# Patient Record
Sex: Female | Born: 1960 | Race: White | Hispanic: No | Marital: Married | Smoking: Never smoker
Health system: Southern US, Community
[De-identification: ages and names within clinical notes are randomized; demographics above are authoritative.]

## PROBLEM LIST (undated history)

## (undated) DIAGNOSIS — J309 Allergic rhinitis, unspecified: Secondary | ICD-10-CM

## (undated) DIAGNOSIS — E039 Hypothyroidism, unspecified: Secondary | ICD-10-CM

## (undated) DIAGNOSIS — J45909 Unspecified asthma, uncomplicated: Secondary | ICD-10-CM

## (undated) DIAGNOSIS — K8020 Calculus of gallbladder without cholecystitis without obstruction: Secondary | ICD-10-CM

## (undated) DIAGNOSIS — E041 Nontoxic single thyroid nodule: Secondary | ICD-10-CM

## (undated) HISTORY — DX: Calculus of gallbladder without cholecystitis without obstruction: K80.20

## (undated) HISTORY — DX: Unspecified asthma, uncomplicated: J45.909

## (undated) HISTORY — DX: Nontoxic single thyroid nodule: E04.1

## (undated) HISTORY — DX: Hypothyroidism, unspecified: E03.9

## (undated) HISTORY — PX: LASIK: SHX215

## (undated) HISTORY — PX: VEIN LIGATION AND STRIPPING: SHX2653

## (undated) HISTORY — PX: AUGMENTATION MAMMAPLASTY: SUR837

## (undated) HISTORY — DX: Allergic rhinitis, unspecified: J30.9

---

## 1999-05-31 HISTORY — PX: CHOLECYSTECTOMY: SHX55

## 1999-07-03 ENCOUNTER — Encounter (HOSPITAL_BASED_OUTPATIENT_CLINIC_OR_DEPARTMENT_OTHER): Payer: Self-pay | Admitting: General Surgery

## 1999-07-08 ENCOUNTER — Encounter (HOSPITAL_BASED_OUTPATIENT_CLINIC_OR_DEPARTMENT_OTHER): Payer: Self-pay | Admitting: General Surgery

## 1999-07-08 ENCOUNTER — Ambulatory Visit (HOSPITAL_COMMUNITY): Admission: RE | Admit: 1999-07-08 | Discharge: 1999-07-09 | Payer: Self-pay | Admitting: General Surgery

## 1999-08-31 HISTORY — PX: BREAST ENHANCEMENT SURGERY: SHX7

## 2000-02-04 ENCOUNTER — Other Ambulatory Visit: Admission: RE | Admit: 2000-02-04 | Discharge: 2000-02-04 | Payer: Self-pay | Admitting: Family Medicine

## 2001-02-27 ENCOUNTER — Other Ambulatory Visit: Admission: RE | Admit: 2001-02-27 | Discharge: 2001-02-27 | Payer: Self-pay | Admitting: Family Medicine

## 2002-05-01 ENCOUNTER — Other Ambulatory Visit: Admission: RE | Admit: 2002-05-01 | Discharge: 2002-05-01 | Payer: Self-pay | Admitting: Family Medicine

## 2004-04-30 ENCOUNTER — Other Ambulatory Visit: Admission: RE | Admit: 2004-04-30 | Discharge: 2004-04-30 | Payer: Self-pay | Admitting: Family Medicine

## 2004-05-29 ENCOUNTER — Encounter: Admission: RE | Admit: 2004-05-29 | Discharge: 2004-05-29 | Payer: Self-pay | Admitting: Family Medicine

## 2005-05-18 ENCOUNTER — Ambulatory Visit: Payer: Self-pay | Admitting: Family Medicine

## 2005-06-16 ENCOUNTER — Ambulatory Visit: Payer: Self-pay | Admitting: Family Medicine

## 2005-07-19 ENCOUNTER — Ambulatory Visit: Payer: Self-pay | Admitting: Family Medicine

## 2005-07-19 ENCOUNTER — Encounter: Payer: Self-pay | Admitting: Family Medicine

## 2005-07-19 ENCOUNTER — Other Ambulatory Visit: Admission: RE | Admit: 2005-07-19 | Discharge: 2005-07-19 | Payer: Self-pay | Admitting: Family Medicine

## 2005-08-17 ENCOUNTER — Ambulatory Visit: Payer: Self-pay | Admitting: Family Medicine

## 2006-09-30 ENCOUNTER — Ambulatory Visit: Payer: Self-pay | Admitting: Family Medicine

## 2006-10-04 ENCOUNTER — Ambulatory Visit: Payer: Self-pay | Admitting: Family Medicine

## 2006-10-04 ENCOUNTER — Other Ambulatory Visit: Admission: RE | Admit: 2006-10-04 | Discharge: 2006-10-04 | Payer: Self-pay | Admitting: Family Medicine

## 2006-10-04 ENCOUNTER — Encounter: Payer: Self-pay | Admitting: Family Medicine

## 2007-02-07 ENCOUNTER — Telehealth (INDEPENDENT_AMBULATORY_CARE_PROVIDER_SITE_OTHER): Payer: Self-pay | Admitting: *Deleted

## 2007-02-08 DIAGNOSIS — E041 Nontoxic single thyroid nodule: Secondary | ICD-10-CM | POA: Insufficient documentation

## 2007-09-06 ENCOUNTER — Telehealth (INDEPENDENT_AMBULATORY_CARE_PROVIDER_SITE_OTHER): Payer: Self-pay | Admitting: *Deleted

## 2007-10-23 ENCOUNTER — Encounter: Payer: Self-pay | Admitting: Family Medicine

## 2007-10-23 ENCOUNTER — Ambulatory Visit: Payer: Self-pay | Admitting: Family Medicine

## 2007-10-23 ENCOUNTER — Other Ambulatory Visit: Admission: RE | Admit: 2007-10-23 | Discharge: 2007-10-23 | Payer: Self-pay | Admitting: Family Medicine

## 2007-10-23 ENCOUNTER — Encounter: Admission: RE | Admit: 2007-10-23 | Discharge: 2007-10-23 | Payer: Self-pay | Admitting: Family Medicine

## 2007-10-23 DIAGNOSIS — E039 Hypothyroidism, unspecified: Secondary | ICD-10-CM | POA: Insufficient documentation

## 2007-10-24 ENCOUNTER — Encounter: Payer: Self-pay | Admitting: Family Medicine

## 2007-10-25 ENCOUNTER — Encounter (INDEPENDENT_AMBULATORY_CARE_PROVIDER_SITE_OTHER): Payer: Self-pay | Admitting: *Deleted

## 2007-10-31 ENCOUNTER — Telehealth (INDEPENDENT_AMBULATORY_CARE_PROVIDER_SITE_OTHER): Payer: Self-pay | Admitting: *Deleted

## 2008-09-05 ENCOUNTER — Ambulatory Visit: Payer: Self-pay | Admitting: Family Medicine

## 2008-09-05 DIAGNOSIS — G4700 Insomnia, unspecified: Secondary | ICD-10-CM | POA: Insufficient documentation

## 2008-09-20 ENCOUNTER — Ambulatory Visit: Payer: Self-pay | Admitting: Family Medicine

## 2008-10-14 ENCOUNTER — Ambulatory Visit: Payer: Self-pay | Admitting: Family Medicine

## 2008-10-14 ENCOUNTER — Encounter: Payer: Self-pay | Admitting: Family Medicine

## 2008-10-14 ENCOUNTER — Other Ambulatory Visit: Admission: RE | Admit: 2008-10-14 | Discharge: 2008-10-14 | Payer: Self-pay | Admitting: Family Medicine

## 2008-10-14 LAB — CONVERTED CEMR LAB
Nitrite: NEGATIVE
Urobilinogen, UA: NEGATIVE

## 2008-10-15 ENCOUNTER — Encounter: Payer: Self-pay | Admitting: Family Medicine

## 2008-10-16 ENCOUNTER — Telehealth: Payer: Self-pay | Admitting: Family Medicine

## 2008-10-23 ENCOUNTER — Ambulatory Visit: Payer: Self-pay | Admitting: Family Medicine

## 2008-10-24 ENCOUNTER — Encounter (INDEPENDENT_AMBULATORY_CARE_PROVIDER_SITE_OTHER): Payer: Self-pay | Admitting: *Deleted

## 2008-10-28 ENCOUNTER — Encounter (INDEPENDENT_AMBULATORY_CARE_PROVIDER_SITE_OTHER): Payer: Self-pay | Admitting: *Deleted

## 2008-10-28 ENCOUNTER — Telehealth (INDEPENDENT_AMBULATORY_CARE_PROVIDER_SITE_OTHER): Payer: Self-pay | Admitting: *Deleted

## 2008-10-28 LAB — CONVERTED CEMR LAB
ALT: 20 units/L (ref 0–35)
Alkaline Phosphatase: 32 units/L — ABNORMAL LOW (ref 39–117)
Bilirubin, Direct: 0.2 mg/dL (ref 0.0–0.3)
CO2: 25 meq/L (ref 19–32)
Chloride: 100 meq/L (ref 96–112)
Glucose, Bld: 75 mg/dL (ref 70–99)
HDL: 61 mg/dL (ref >39.0)
Hemoglobin: 13.5 g/dL (ref 12.0–15.0)
LDL Cholesterol: 126 mg/dL — ABNORMAL HIGH (ref 0–99)
Lymphocytes Relative: 41.3 % (ref 12.0–46.0)
Monocytes Relative: 10 % (ref 3.0–12.0)
Neutrophils Relative %: 47.4 % (ref 43.0–77.0)
Platelets: 248 10*3/uL (ref 150–400)
Potassium: 3.3 meq/L — ABNORMAL LOW (ref 3.5–5.1)
RDW: 12.4 % (ref 11.5–14.6)
Sodium: 137 meq/L (ref 135–145)
Total Bilirubin: 1.3 mg/dL — ABNORMAL HIGH (ref 0.3–1.2)
Total CHOL/HDL Ratio: 3.3
Total Protein: 7.1 g/dL (ref 6.0–8.3)
Triglycerides: 62 mg/dL (ref 0–149)
VLDL: 12 mg/dL (ref 0–40)

## 2009-01-08 ENCOUNTER — Telehealth (INDEPENDENT_AMBULATORY_CARE_PROVIDER_SITE_OTHER): Payer: Self-pay | Admitting: *Deleted

## 2009-02-21 ENCOUNTER — Ambulatory Visit: Payer: Self-pay | Admitting: Family Medicine

## 2009-02-27 ENCOUNTER — Telehealth (INDEPENDENT_AMBULATORY_CARE_PROVIDER_SITE_OTHER): Payer: Self-pay | Admitting: *Deleted

## 2009-04-07 ENCOUNTER — Telehealth (INDEPENDENT_AMBULATORY_CARE_PROVIDER_SITE_OTHER): Payer: Self-pay | Admitting: *Deleted

## 2009-04-08 LAB — CONVERTED CEMR LAB
ALT: 14 units/L (ref 0–35)
Alkaline Phosphatase: 37 units/L — ABNORMAL LOW (ref 39–117)
Bilirubin, Direct: 0.1 mg/dL (ref 0.0–0.3)
HDL: 53.8 mg/dL (ref >39.00)
LDL Cholesterol: 95 mg/dL (ref 0–99)
Total Bilirubin: 1.1 mg/dL (ref 0.3–1.2)
Total CHOL/HDL Ratio: 3
Total Protein: 7.1 g/dL (ref 6.0–8.3)

## 2009-04-09 ENCOUNTER — Encounter (INDEPENDENT_AMBULATORY_CARE_PROVIDER_SITE_OTHER): Payer: Self-pay | Admitting: *Deleted

## 2009-08-30 HISTORY — PX: DILATION AND CURETTAGE OF UTERUS: SHX78

## 2009-08-30 HISTORY — PX: OTHER SURGICAL HISTORY: SHX169

## 2009-10-16 ENCOUNTER — Encounter (INDEPENDENT_AMBULATORY_CARE_PROVIDER_SITE_OTHER): Payer: Self-pay | Admitting: *Deleted

## 2009-10-16 ENCOUNTER — Ambulatory Visit: Payer: Self-pay | Admitting: Family Medicine

## 2009-10-16 ENCOUNTER — Other Ambulatory Visit: Admission: RE | Admit: 2009-10-16 | Discharge: 2009-10-16 | Payer: Self-pay | Admitting: Family Medicine

## 2009-10-16 DIAGNOSIS — I8310 Varicose veins of unspecified lower extremity with inflammation: Secondary | ICD-10-CM | POA: Insufficient documentation

## 2009-10-16 DIAGNOSIS — N841 Polyp of cervix uteri: Secondary | ICD-10-CM | POA: Insufficient documentation

## 2009-10-20 LAB — CONVERTED CEMR LAB
AST: 24 units/L (ref 0–37)
Albumin: 4.6 g/dL (ref 3.5–5.2)
BUN: 15 mg/dL (ref 6–23)
Basophils Absolute: 0 10*3/uL (ref 0.0–0.1)
CO2: 28 meq/L (ref 19–32)
Cholesterol: 177 mg/dL (ref 0–200)
Eosinophils Absolute: 0 10*3/uL (ref 0.0–0.7)
Free T4: 1.3 ng/dL (ref 0.6–1.6)
GFR calc non Af Amer: 56.74 mL/min (ref >60)
Glucose, Bld: 83 mg/dL (ref 70–99)
HCT: 42 % (ref 36.0–46.0)
HDL: 72.7 mg/dL (ref >39.00)
Hemoglobin: 13.9 g/dL (ref 12.0–15.0)
Lymphs Abs: 2.3 10*3/uL (ref 0.7–4.0)
MCHC: 33.1 g/dL (ref 30.0–36.0)
Monocytes Absolute: 0.7 10*3/uL (ref 0.1–1.0)
Monocytes Relative: 9.6 % (ref 3.0–12.0)
Neutro Abs: 3.8 10*3/uL (ref 1.4–7.7)
Platelets: 220 10*3/uL (ref 150.0–400.0)
Potassium: 5.2 meq/L — ABNORMAL HIGH (ref 3.5–5.1)
RDW: 12.7 % (ref 11.5–14.6)
Sodium: 141 meq/L (ref 135–145)
T3, Free: 2.7 pg/mL (ref 2.3–4.2)
TSH: 0.62 microintl units/mL (ref 0.35–5.50)
Total Bilirubin: 0.7 mg/dL (ref 0.3–1.2)
VLDL: 17.6 mg/dL (ref 0.0–40.0)

## 2009-10-21 ENCOUNTER — Encounter (INDEPENDENT_AMBULATORY_CARE_PROVIDER_SITE_OTHER): Payer: Self-pay | Admitting: *Deleted

## 2009-10-21 LAB — CONVERTED CEMR LAB: Pap Smear: NEGATIVE

## 2009-10-22 ENCOUNTER — Telehealth: Payer: Self-pay | Admitting: Family Medicine

## 2009-10-24 ENCOUNTER — Ambulatory Visit: Payer: Self-pay | Admitting: Family Medicine

## 2009-10-25 LAB — CONVERTED CEMR LAB: Fecal Occult Bld: NEGATIVE

## 2009-11-14 ENCOUNTER — Encounter (INDEPENDENT_AMBULATORY_CARE_PROVIDER_SITE_OTHER): Payer: Self-pay | Admitting: *Deleted

## 2009-12-19 ENCOUNTER — Ambulatory Visit: Payer: Self-pay | Admitting: Diagnostic Radiology

## 2009-12-19 ENCOUNTER — Ambulatory Visit (HOSPITAL_BASED_OUTPATIENT_CLINIC_OR_DEPARTMENT_OTHER): Admission: RE | Admit: 2009-12-19 | Discharge: 2009-12-19 | Payer: Self-pay | Admitting: Family Medicine

## 2010-01-19 ENCOUNTER — Ambulatory Visit (HOSPITAL_BASED_OUTPATIENT_CLINIC_OR_DEPARTMENT_OTHER): Admission: RE | Admit: 2010-01-19 | Discharge: 2010-01-19 | Payer: Self-pay | Admitting: Family Medicine

## 2010-01-19 ENCOUNTER — Ambulatory Visit: Payer: Self-pay | Admitting: Diagnostic Radiology

## 2010-01-20 DIAGNOSIS — N926 Irregular menstruation, unspecified: Secondary | ICD-10-CM | POA: Insufficient documentation

## 2010-01-29 ENCOUNTER — Encounter: Payer: Self-pay | Admitting: Family Medicine

## 2010-02-04 ENCOUNTER — Encounter: Payer: Self-pay | Admitting: Family Medicine

## 2010-04-07 ENCOUNTER — Encounter: Payer: Self-pay | Admitting: Family Medicine

## 2010-04-07 ENCOUNTER — Telehealth: Payer: Self-pay | Admitting: Family Medicine

## 2010-05-05 ENCOUNTER — Ambulatory Visit: Payer: Self-pay | Admitting: Vascular Surgery

## 2010-05-05 ENCOUNTER — Encounter: Payer: Self-pay | Admitting: Family Medicine

## 2010-07-06 ENCOUNTER — Encounter: Payer: Self-pay | Admitting: Family Medicine

## 2010-07-07 ENCOUNTER — Encounter: Payer: Self-pay | Admitting: Family Medicine

## 2010-07-07 ENCOUNTER — Telehealth: Payer: Self-pay | Admitting: Family Medicine

## 2010-07-13 ENCOUNTER — Ambulatory Visit: Payer: Self-pay | Admitting: Family Medicine

## 2010-07-13 ENCOUNTER — Ambulatory Visit: Payer: Self-pay | Admitting: Vascular Surgery

## 2010-07-17 LAB — CONVERTED CEMR LAB
Albumin: 4.2 g/dL (ref 3.5–5.2)
Alkaline Phosphatase: 33 units/L — ABNORMAL LOW (ref 39–117)
CO2: 25 meq/L (ref 19–32)
Chloride: 103 meq/L (ref 96–112)
Cholesterol: 179 mg/dL (ref 0–200)
Creatinine, Ser: 1 mg/dL (ref 0.4–1.2)
HDL: 64.4 mg/dL (ref >39.00)
LDL Cholesterol: 97 mg/dL (ref 0–99)
Sodium: 136 meq/L (ref 135–145)
Total Protein: 7.2 g/dL (ref 6.0–8.3)
Triglycerides: 88 mg/dL (ref 0.0–149.0)

## 2010-08-06 ENCOUNTER — Telehealth (INDEPENDENT_AMBULATORY_CARE_PROVIDER_SITE_OTHER): Payer: Self-pay | Admitting: *Deleted

## 2010-08-17 ENCOUNTER — Ambulatory Visit: Payer: Self-pay | Admitting: Vascular Surgery

## 2010-09-27 LAB — CONVERTED CEMR LAB
ALT: 16 units/L (ref 0–35)
AST: 22 units/L (ref 0–37)
AST: 29 units/L (ref 0–37)
Albumin: 4.2 g/dL (ref 3.5–5.2)
Alkaline Phosphatase: 38 units/L — ABNORMAL LOW (ref 39–117)
BUN: 13 mg/dL (ref 6–23)
Basophils Absolute: 0.1 10*3/uL (ref 0.0–0.1)
Basophils Relative: 0.5 % (ref 0.0–1.0)
Basophils Relative: 1.1 % — ABNORMAL HIGH (ref 0.0–1.0)
Bilirubin, Direct: 0.1 mg/dL (ref 0.0–0.3)
CO2: 28 meq/L (ref 19–32)
Calcium: 9.1 mg/dL (ref 8.4–10.5)
Chloride: 104 meq/L (ref 96–112)
Chloride: 104 meq/L (ref 96–112)
Creatinine, Ser: 1.1 mg/dL (ref 0.4–1.2)
Eosinophils Relative: 1.4 % (ref 0.0–5.0)
GFR calc non Af Amer: 52 mL/min
Glucose, Bld: 88 mg/dL (ref 70–99)
HCT: 40.9 % (ref 36.0–46.0)
MCHC: 35.5 g/dL (ref 30.0–36.0)
Neutrophils Relative %: 58.2 % (ref 43.0–77.0)
Platelets: 277 10*3/uL (ref 150–400)
RBC: 4.19 M/uL (ref 3.87–5.11)
RBC: 4.22 M/uL (ref 3.87–5.11)
RDW: 12.1 % (ref 11.5–14.6)
RDW: 12.4 % (ref 11.5–14.6)
Sodium: 138 meq/L (ref 135–145)
Total Bilirubin: 0.8 mg/dL (ref 0.3–1.2)
Total CHOL/HDL Ratio: 3.5
Total CHOL/HDL Ratio: 3.8
Total Protein: 7.5 g/dL (ref 6.0–8.3)
Triglycerides: 108 mg/dL (ref 0–149)
Triglycerides: 76 mg/dL (ref 0–149)
VLDL: 15 mg/dL (ref 0–40)
VLDL: 22 mg/dL (ref 0–40)
WBC: 8 10*3/uL (ref 4.5–10.5)

## 2010-09-29 ENCOUNTER — Ambulatory Visit
Admission: RE | Admit: 2010-09-29 | Discharge: 2010-09-29 | Payer: Self-pay | Source: Home / Self Care | Attending: Vascular Surgery | Admitting: Vascular Surgery

## 2010-09-29 NOTE — Assessment & Plan Note (Signed)
Summary: CPX//PH   Vital Signs:  Patient profile:   50 year old female Height:      66.50 inches Weight:      144 pounds BMI:     22.98 Temp:     98.3 degrees F oral Pulse rate:   80 / minute Pulse rhythm:   regular BP sitting:   122 / 80  (left arm) Cuff size:   regular  Vitals Entered By: Army Fossa CMA (October 16, 2009 8:29 AM) CC: CPX, pap.    History of Present Illness: Pt here for cpe, pap and  labs.  Pt still having hot flashes and periods are irregular on bcp.   Hot flashes are waking her up. It is definitely getting worse.    Preventive Screening-Counseling & Management  Alcohol-Tobacco     Alcohol drinks/day: <1     Alcohol type: wine, beer     Smoking Status: never     Passive Smoke Exposure: no  Caffeine-Diet-Exercise     Caffeine use/day: 2     Does Patient Exercise: yes     Type of exercise: walk 2 miles a day, 10 min stom and butt and thighs     Exercise (avg: min/session): 30-60     Times/week: 5  Hep-HIV-STD-Contraception     HIV Risk: no     Dental Visit-last 6 months yes     Dental Care Counseling: not indicated; dental care within six months     SBE monthly: yes  Safety-Violence-Falls     Seat Belt Use: 100      Sexual History:  currently monogamous.    Current Medications (verified): 1)  Necon 1/35 (28) 1-35 Mg-Mcg Tabs (Norethindrone-Eth Estradiol) .... Take One Tablet Daily 2)  Levothyroxine Sodium 50 Mcg Tabs (Levothyroxine Sodium) .... Take One Tablet Daily 3)  Ambien Cr 12.5 Mg Cr-Tabs (Zolpidem Tartrate) .Marland Kitchen.. 1 By Mouth At Bedtime As Needed 4)  Pravachol 40 Mg Tabs (Pravastatin Sodium) .... Take 1 By Mouth  At Bedtime. Needs Labwork. 5)  Calcarb 600 1500 Mg Tabs (Calcium Carbonate) 6)  Vitamin D 1000 Unit Tabs (Cholecalciferol)  Allergies (verified): No Known Drug Allergies  Past History:  Past Medical History: Last updated: 10/23/2007 Gallstones Hypothyroidism thyroid nodule  Past Surgical History: Last updated:  02/08/2007 C/S x 2 Lasix Cholecystectomy-05/1999 Breast Augmentation-2001  Family History: Last updated: 10/23/2007 Family History Hypertension Family History of Neurological disorder-neuropathy Fam hx CVA PUncle--Lymphoma PGF--Cancer--?  Social History: Last updated: 10/23/2007 Married Alcohol use-yes Regular exercise-yes Occupation: Worker's Comp-- Underwriting assist.                    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 Family History Hypertension Family History of Neurological disorder-neuropathy Fam hx CVA PUncle--Lymphoma PGF--Cancer--?  Social History: Reviewed history from 10/23/2007 and no changes required. Married Alcohol use-yes Regular exercise-yes Occupation: Worker's Comp-- Biomedical scientist.                    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 Eyes:  Denies blurring, discharge, double vision, eye irritation, eye pain, halos, itching, light sensitivity, red eye, vision loss-1 eye, and vision loss-both eyes; opthoq1y. ENT:  Denies decreased hearing, difficulty swallowing, ear discharge, earache, hoarseness, nasal congestion, nosebleeds, postnasal drainage, ringing in ears, sinus pressure, and sore throat. CV:  Denies bluish discoloration of lips or nails, chest pain or discomfort, difficulty breathing at night, difficulty breathing while lying down, fainting, fatigue, leg cramps with exertion, lightheadness,  near fainting, palpitations, shortness of breath with exertion, swelling of feet, swelling of hands, and weight gain. Resp:  Denies chest discomfort, chest pain with inspiration, cough, coughing up blood, excessive snoring, hypersomnolence, morning headaches, pleuritic, shortness of breath, sputum productive, and wheezing. GI:  Denies abdominal pain, bloody stools, change in bowel habits, constipation, dark tarry stools, diarrhea, excessive appetite, gas, hemorrhoids, indigestion, loss of appetite, nausea, vomiting, vomiting blood, and yellowish skin color. GU:  Denies abnormal vaginal bleeding, decreased libido, discharge, dysuria, genital sores, hematuria, incontinence, nocturia, urinary frequency, and urinary hesitancy. MS:  Denies joint pain, joint redness, joint swelling, loss of strength, low back pain, mid back pain, muscle aches, muscle , cramps, muscle weakness, stiffness, and thoracic pain; L LE varicose vein-- causing pain. Derm:  Denies changes in color of skin, changes in nail beds, dryness, excessive perspiration, flushing, hair loss, insect bite(s), itching, lesion(s), poor wound healing, and rash; varicose veins. Neuro:  Denies brief paralysis, difficulty with concentration, disturbances in coordination, falling down, headaches, inability to speak, memory loss, numbness, poor balance, seizures, sensation of room spinning, tingling, tremors, visual disturbances, and weakness. Psych:  Denies alternate hallucination ( auditory/visual), anxiety, depression, easily angered, easily tearful, irritability, mental problems, panic attacks, sense of great danger, suicidal thoughts/plans, thoughts of violence, unusual visions or sounds, and thoughts /plans of harming others. Endo:  Denies cold intolerance, excessive hunger, excessive thirst, excessive urination, heat intolerance, polyuria, and weight change. Heme:  Denies abnormal bruising, bleeding, enlarge lymph nodes, fevers, pallor, and skin  discoloration. Allergy:  Denies hives or rash, itching eyes, persistent infections, seasonal allergies, and sneezing.  Physical Exam  General:  Well-developed,well-nourished,in no acute distress; alert,appropriate and cooperative throughout examination Head:  Normocephalic and atraumatic without obvious abnormalities. No apparent alopecia or balding. Eyes:  vision grossly intact, pupils equal, pupils round, and pupils reactive to light.   Ears:  External ear exam shows no significant lesions or deformities.  Otoscopic examination reveals clear canals, tympanic membranes are intact bilaterally without bulging, retraction, inflammation or discharge. Hearing is grossly normal bilaterally. Nose:  External nasal examination shows no deformity or inflammation. Nasal mucosa are pink and moist without lesions or exudates. Mouth:  Oral mucosa and oropharynx without lesions or exudates.  Teeth in good repair. Neck:  No deformities, masses, or tenderness noted.no carotid bruits.   Chest Wall:  No deformities, masses, or tenderness noted. Breasts:  No mass, nodules, thickening, tenderness, bulging, retraction, inflamation, nipple discharge or skin changes noted.   + implants Lungs:  Normal respiratory effort, chest expands symmetrically. Lungs are clear to auscultation, no crackles or wheezes. Heart:  Normal rate and regular rhythm. S1 and S2 normal without gallop, murmur, click, rub or other extra sounds. Abdomen:  Bowel sounds positive,abdomen soft and non-tender without masses, organomegaly or hernias noted. Rectal:  No external abnormalities noted. Normal sphincter tone. No rectal masses or tenderness. Genitalia:  Pelvic Exam:        External: normal female genitalia without lesions or masses  Vagina: normal without lesions or masses        Cervix: normal without lesions or masses        Adnexa: normal bimanual exam without masses or fullness        Uterus: normal by palpation        Pap smear:  performed Extremities:  L Low ext-- varicosity below pop. fossa no edema  no calf pain Neurologic:  No cranial nerve deficits noted. Station and gait are normal. Plantar reflexes are down-going bilaterally. DTRs are symmetrical throughout. Sensory, motor and coordinative functions appear intact.   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 Take one tablet daily  Labs Reviewed: TSH: 0.65 (10/14/2008)    Chol: 167 (02/21/2009)   HDL: 53.80 (02/21/2009)   LDL: 95 (02/21/2009)   TG: 93.0 (02/21/2009)  Orders: Venipuncture (86578) TLB-Lipid Panel (80061-LIPID) TLB-BMP (Basic Metabolic Panel-BMET) (80048-METABOL) TLB-CBC Platelet - w/Differential (85025-CBCD) TLB-Hepatic/Liver Function Pnl (80076-HEPATIC) TLB-TSH (Thyroid Stimulating Hormone) (84443-TSH) TLB-T3, Free (Triiodothyronine) (84481-T3FREE) TLB-T4 (Thyrox), Free [phone])  Complete  Medication List: 1)  Necon 1/35 (28) 1-35 Mg-mcg Tabs (Norethindrone-eth estradiol) .... Take one tablet daily 2)  Levothyroxine Sodium 50 Mcg Tabs (Levothyroxine sodium) .... Take one tablet daily 3)  Ambien Cr 12.5 Mg Cr-tabs (Zolpidem tartrate) .Marland Kitchen.. 1 by mouth at bedtime as needed 4)  Pravachol 40 Mg Tabs (Pravastatin sodium) .... Take 1 by mouth  at bedtime. needs labwork. 5)  Calcarb 600 1500 Mg Tabs (Calcium carbonate) 6)  Vitamin D 1000 Unit Tabs (Cholecalciferol) Prescriptions: NECON 1/35 (28) 1-35 MG-MCG TABS (NORETHINDRONE-ETH ESTRADIOL) take one tablet daily  #1 x 11   Entered and Authorized by:   Loreen Freud DO   Signed by:   Loreen Freud DO on 10/16/2009   Method used:   Electronically to        [facility]* (retail)       [address] [place], [place]  [address]       Ph: [phone]       Fax: [phone]   RxID:   [id number]    EKG  Procedure date:  10/16/2009  Findings:      NSR  76 bpm      Flu Vaccine Next Due:  Refused

## 2010-09-29 NOTE — Letter (Signed)
Summary: [facility]   Imported By: Lanelle Bal 02/17/2010 10:26:03  _____________________________________________________________________  External Attachment:    Type:   Image     Comment:   External Document

## 2010-09-29 NOTE — Letter (Signed)
Summary: Primary Care Appointment Letter  [facility]  [address] [place], [place] [address]   Phone: [phone]  Fax: [phone]    07/06/2010 MRN: [id number]  [facility] [address] [place], [place]  [address]  Dear Ms. Boerema,   Your Primary Care Physician Loreen Freud DO has indicated that:    _______it is time to schedule an appointment.    _______you missed your appointment on______ and need to call and          reschedule.    ___X__you need to have lab work done.    _______you need to schedule an appointment discuss lab or test results.    _______you need to call to reschedule your appointment that is                       scheduled on _________.     Please call our office as soon as possible. Our phone number is [phone]. Please press option 1. Our office is open 8a-5p, Monday through Friday.     Thank you,          [facility] Scheduler

## 2010-09-29 NOTE — Consult Note (Signed)
Summary: [facility]  [facility]   Imported By: Sherian Rein 05/22/2010 15:09:11  _____________________________________________________________________  External Attachment:    Type:   Image     Comment:   External Document

## 2010-09-29 NOTE — Progress Notes (Signed)
Summary: note   Phone Note Call from Patient Call back at [phone]   Summary of Call: Pt states that she needs a note stating that she was rx compression stocking. Pt needs this to take to her [facility] appt on 07-13-10. See phone note 04-07-10 pls advise............Marland KitchenFelecia Deloach CMA  July 07, 2010 8:54 AM   Follow-up for Phone Call        note typed Follow-up by: Loreen Freud DO,  July 07, 2010 9:32 AM  Additional Follow-up for Phone Call Additional follow up Details #1::        Note at check in, and advise pt that labs are now due. Transferred to appt.... Almeta Monas CMA Duncan Dull)  July 07, 2010 10:24 AM

## 2010-09-29 NOTE — Letter (Signed)
Summary: Primary Care Consult Scheduled Letter  [facility]  [address] [place], [place] [address]   Phone: [phone]  Fax: [phone]      11/14/2009 MRN: [id number]  [facility] [address] [place], [place]  [address]    Dear Ms. Seiber,    We have scheduled an appointment for you.  At the recommendation of Dr. Loreen Freud, we have scheduled you for a Screening Mammogram with [facility] on 12-15-2009 at 3:30pm.  We have also scheduled you for a Pelvis Ultrasound at the same facility on 01-19-2010 at 3:20pm.  Their address is [address], [place]. The office phone number is [phone].  If this appointment day and time is not convenient for you, please feel free to call the office of the doctor you are being referred to at the number listed above and reschedule the appointment.    It is important for you to keep your scheduled appointments. We are here to make sure you are given good patient care.   Thank you,    Renee, Patient Care Coordinator [place] [facility]

## 2010-09-29 NOTE — Progress Notes (Signed)
Summary: levothyroxine, pravachol refills  Phone Note Refill Request Message from:  Fax from Pharmacy on August 06, 2010 4:15 PM  Refills Requested: Medication #1:  LEVOTHYROXINE SODIUM 50 MCG TABS take one tablet daily   Last Refilled: 07/06/2010   Notes: qty = 30  Medication #2:  PRAVACHOL 40 MG TABS take 1 by mouth  at bedtime. NEEDS LABWORK.   Last Refilled: 07/06/2010   Notes: qty = [address] [facility], [place], [place], [place]    phone-[phone]   fax-[phone]     Next Appointment Scheduled: none Initial call taken by: Jerolyn Shin,  August 06, 2010 4:23 PM    New/Updated Medications: PRAVACHOL 40 MG TABS (PRAVASTATIN SODIUM) take 1 by mouth  at bedtime. Prescriptions: PRAVACHOL 40 MG TABS (PRAVASTATIN SODIUM) take 1 by mouth  at bedtime.  #30 x 2   Entered by:   Almeta Monas CMA (AAMA)   Authorized by:   Loreen Freud DO   Signed by:   Almeta Monas CMA (AAMA) on 08/06/2010   Method used:   Electronically to        [facility]* (retail)       [address] [place], [place]  [address]       Ph: [phone]       Fax: [phone]   RxID:   [id number] LEVOTHYROXINE SODIUM 50 MCG TABS (LEVOTHYROXINE SODIUM) take one tablet daily  #30 Each x 2   Entered by:   Almeta Monas CMA (AAMA)   Authorized by:   Loreen Freud DO   Signed by:   Almeta Monas CMA (AAMA) on 08/06/2010   Method used:   Electronically to        [facility] (retail)       [address] [place], [place]  [address]       Ph: [phone]       Fax: [phone]   RxID:   [id number]

## 2010-09-29 NOTE — Letter (Signed)
Summary: Results Follow up Letter  [facility]  [address] [place], [place] [address]   Phone: [phone]  Fax: [phone]    10/21/2009 MRN: [id number]  Judy Ramos [address] [place], [place]  [address]  Dear Ms. Tapia,  The following are the results of your recent test(s):  Test         Result    Pap Smear:        Normal __X___  Not Normal _____ Comments: ______________________________________________________ Cholesterol: LDL(Bad cholesterol):         Your goal is less than:         HDL (Good cholesterol):       Your goal is more than: Comments:  ______________________________________________________ Mammogram:        Normal _____  Not Normal _____ Comments:  ___________________________________________________________________ Hemoccult:        Normal _____  Not normal _______ Comments:    _____________________________________________________________________ Other Tests:    We routinely do not discuss normal results over the telephone.  If you desire a copy of the results, or you have any questions about this information we can discuss them at your next office visit.   Sincerely,    Army Fossa CMA  October 21, 2009 4:56 PM

## 2010-09-29 NOTE — Letter (Signed)
Summary: Generic Letter  [facility]  [address] [place], [place] [address]   Phone: [phone]  Fax: [phone]    07/07/2010  RE: Judy Ramos Dob:01-12-1961 [address] [place], [place]  [address]  To whom It May Concern:  The above patient was prescribed compression stockings on 04/07/2010.           Sincerely,   Loreen Freud DO

## 2010-09-29 NOTE — Progress Notes (Signed)
Summary: Meds  Phone Note Outgoing Call   Summary of Call: Please call pt and find out if hotflashes occur all the time or only during placebo pills. Initial call taken by: Loreen Freud DO,  October 22, 2009 8:40 AM  Follow-up for Phone Call        Left message for pt to call back. Army Fossa CMA  October 22, 2009 8:47 AM   Additional Follow-up for Phone Call Additional follow up Details #1::        Pt states that her hot flashes occur all the time. Army Fossa CMA  October 22, 2009 3:10 PM     Additional Follow-up for Phone Call Additional follow up Details #2::    add vivelle dot 0.025 1 patch 2x a week # 8   5 refills  Follow-up by: Loreen Freud DO,  October 22, 2009 3:58 PM  Additional Follow-up for Phone Call Additional follow up Details #3:: Details for Additional Follow-up Action Taken: Pt is aware. Army Fossa CMA  October 22, 2009 4:04 PM   New/Updated Medications: VIVELLE-DOT 0.025 MG/24HR PTTW (ESTRADIOL) 1 patch 2x a week. Prescriptions: VIVELLE-DOT 0.025 MG/24HR PTTW (ESTRADIOL) 1 patch 2x a week.  #8 x 5   Entered by:   Army Fossa CMA   Authorized by:   Loreen Freud DO   Signed by:   Army Fossa CMA on 10/22/2009   Method used:   Electronically to        [facility]* (retail)       [address] [place], [place]  [address]       Ph: [phone]       Fax: [phone]   RxID:   [id number]

## 2010-09-29 NOTE — Consult Note (Signed)
Summary: [facility]   Imported By: Lanelle Bal 02/12/2010 08:11:51  _____________________________________________________________________  External Attachment:    Type:   Image     Comment:   External Document

## 2010-09-29 NOTE — Progress Notes (Signed)
Summary: Triage: Varicose Veins  Phone Note Call from Patient Call back at Home Phone [phone]   Caller: Patient Summary of Call: Message left on Triage VM: Patient had an annual sometime in Feb 2011 and mentioned her Varicose Veins, patient said she would now like to consider order for compression stockings and Dr.Lowne mentioned she could refer her to someone to futher address her Veins   Dr.Lowne please advise./[facility] CMA  April 07, 2010 4:39 PM   Follow-up for Phone Call        rx written referral put in Follow-up by: Loreen Freud DO,  April 07, 2010 4:49 PM    Additional Follow-up for Phone Call Additional follow up Details #2::    DISCUSS WITH PATIENT................Marland KitchenFelecia Deloach CMA  April 07, 2010 5:06 PM

## 2010-09-29 NOTE — Letter (Signed)
Summary: [facility]: Immunoassay Fecal Occult Blood (iFOB) Order Form  [facility]  [address] [place], [place] [address]   Phone: [phone]  Fax: [phone]      [facility]: Immunoassay Fecal Occult Blood (iFOB) Order Form   October 16, 2009 MRN: [id number]   Judy Ramos 1961/05/07   Physicican Name:______Yvonne Lowne___________________  Diagnosis Code:________v76.51__________________      Army Fossa CMA

## 2010-09-29 NOTE — Medication Information (Signed)
Summary: Compression Hose/[facility]  Compression Hose/[facility]   Imported By: Lanelle Bal 04/16/2010 10:42:07  _____________________________________________________________________  External Attachment:    Type:   Image     Comment:   External Document

## 2010-09-30 NOTE — Assessment & Plan Note (Signed)
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  The small saphenous vein was too small to cannulate and closed with laser ablation.  She states that the discomfort she was experiencing in the distal thigh and calf is now gone and she had no swelling in the left calf and ankle following the procedure.  The pain has been minimal.  PHYSICAL EXAMINATION:  Vital signs:  On exam, blood pressure 113/72, heart rate 71, respirations 18.  The stab phlebectomy sites have healed nicely with no tenderness or inflammation and no distal edema with 3+ dorsalis pedis pulse.  Reassured her regarding these findings.  She will return to see [place] on a p.r.n. basis.    Quita Skye Hart [place], M.D. Electronically Signed  JDL/MED[facility]  D:  09/29/2010  T:  09/30/2010  Job:  [id number]

## 2010-12-23 ENCOUNTER — Ambulatory Visit (INDEPENDENT_AMBULATORY_CARE_PROVIDER_SITE_OTHER): Payer: No Typology Code available for payment source | Admitting: Internal Medicine

## 2010-12-23 ENCOUNTER — Encounter: Payer: Self-pay | Admitting: Internal Medicine

## 2010-12-23 DIAGNOSIS — R21 Rash and other nonspecific skin eruption: Secondary | ICD-10-CM

## 2010-12-23 MED ORDER — HYDROCORTISONE 2.5 % EX CREA: TOPICAL_CREAM | Freq: Two times a day (BID) | CUTANEOUS | Status: DC

## 2010-12-23 NOTE — Assessment & Plan Note (Signed)
Rash is not consistent with shingles or a fungal infection. I suspect this is contact dermatitis poison ivy. See instructions and prescription. Patient will call if the rash spreads or if she has fever or joint aches

## 2010-12-23 NOTE — Progress Notes (Signed)
  Subjective:    Patient ID: Judy Ramos, female    DOB: September 10, 1960, 50 y.o.   MRN: [id number]  HPI  3 days ago developed a rash on the right side of the neck. Rash is described as balms, slightly red. Some itching,no pain.  Past Medical History  Diagnosis Date  . Gallstone   . Hypothyroidism   . Thyroid nodule    Past Surgical History  Procedure Date  . Cesarean section     x 2  . Lasik   . Cholecystectomy 05/1999  . Breast enhancement surgery 2001     Review of Systems Feels well, no fever or drug use. No blisters that she can tell. No rashes anywhere else. No direct exposure to poison ivy however she has a dog that goes everywhere in the yard      Objective:   Physical Exam Alert oriented in no apparent distress. She has 3 skin lesions, slightly papular, red, no blister or scaly located at the right side of the neck. No other skin lesions noted         Assessment & Plan:

## 2010-12-23 NOTE — Patient Instructions (Signed)
Poison Ivy (Toxicodendron Dermatitis) Poison ivy is a inflammation of the skin (contact dermatitis) caused by touching the allergens on the leaves of the ivy plant following previous exposure to the plant. The rash usually appears 48 hours after exposure. The rash is usually bumps (papules) or blisters (vesicles) in a linear pattern. Depending on your own sensitivity, the rash may simply cause redness and itching, or it may also progress to blisters which may break open. These must be well cared for to prevent secondary bacterial (germ) infection, followed by scarring. Keep any open areas dry, clean, dressed, and covered with an antibacterial ointment if needed. The eyes may also get puffy. The puffiness is worst in the morning and gets better as the day progresses. This dermatitis usually heals without scarring, within 2 to 3 weeks without treatment. HOME CARE INSTRUCTIONS Thoroughly wash with soap and water as soon as you have been exposed to poison ivy. You have about one half hour to remove the plant resin before it will cause the rash. This washing will destroy the oil or antigen on the skin that is causing, or will cause, the rash. Be sure to wash under your fingernails as any plant resin there will continue to spread the rash. Do not rub skin vigorously when washing affected area. Poison ivy cannot spread if no oil from the plant remains on your body. A rash that has progressed to weeping sores will not spread the rash unless you have not washed thoroughly. It is also important to wash any clothes you have been wearing as these may carry active allergens. The rash will return if you wear the unwashed clothing, even several days later. Avoidance of the plant in the future is the best measure. Poison ivy plant can be recognized by the number of leaves. Generally, poison ivy has three leaves with flowering branches on a single stem. Diphenhydramine may be purchased over the counter and used as needed for  itching. Do not drive with this medication if it makes you drowsy. Ask your caregiver about medication for children. SEEK MEDICAL CARE IF   Open sores develop.   Redness spreads beyond area of rash.   You notice purulent (pus-like) discharge.   You have increased pain.   Other signs of infection develop (such as fever).  Document Released: 08/13/2000 Document Re-Released: 08/04/2009 [employer] Patient Information 2011 [employer]. 

## 2011-01-12 NOTE — Assessment & Plan Note (Signed)
OFFICE VISIT   Judy Ramos, Judy Ramos  DOB:  [birth year]                                       07/13/2010  ZOXWR#:[id number]   Ms. 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  These have become  recently symptomatic over the last several years since her pregnancy and  she has been now wearing long-leg elastic compression stockings for 3  months with no improvement in her symptoms.  She has also tried  elevation as much as possible and ibuprofen on a daily basis.  She  develops aching, throbbing and burning discomfort in the calf as well as  some swelling and tightness in the ankle.  She denies any chest pain,  dyspnea on exertion, PND or orthopnea.   PHYSICAL EXAMINATION:  Blood pressure 122/77, heart rate 86,  respirations 24.  She is alert and oriented x3.  She is a well-developed, well-nourished  female in no apparent distress.  Her lower extremity exam reveals 3+ femoral, popliteal, dorsalis pedis  and posterior tibial pulses.  She has bulging varicosities in the left  posterior calf beginning at the popliteal crease extending down to the  mid calf area and some early bulging varicosities in the right posterior  calf in a similar pattern.  She has no hyperpigmentation ulceration or  bulging varicosities anteriorly.   Venous duplex exam performed today was ordered by me and reviewed and  interpreted.  She has no DVT.  She has no great saphenous reflux.  She  does have some reflux in the left small saphenous junction but not  throughout.   I think the reflux at the left small saphenous junction is causing these  varicosities in the left leg and she should be treated by a combination  of stab phlebectomy and sclerotherapy.  We will proceed with  precertification and perform this in the near future for these  symptomatic varicosities.     Quita Skye Hart [place], M.D.  Electronically Signed   JDL/MEDQ   D:  07/13/2010  T:  07/14/2010  Job:  [id number]

## 2011-01-12 NOTE — Assessment & Plan Note (Signed)
OFFICE VISIT   Judy Ramos, Judy Ramos  DOB:  December 28, 1960                                       08/17/2010  ZOXWR#:[id number]   The patient had multiple stab phlebectomies in the left calf and  posterior thigh area today for painful varicosities secondary to reflux  through the small saphenous vein in the left leg.  The vein was  relatively small distally and it was felt the best treatment would be  stab phlebectomy and sclerotherapy.  That was performed today without  difficulty.  She tolerated the procedure well and will return in 6 weeks  for followup.     Quita Skye Hart [place], M.D.  Electronically Signed   JDL/MEDQ  D:  08/17/2010  T:  08/17/2010  Job:  [id number]

## 2011-01-12 NOTE — Procedures (Signed)
LOWER EXTREMITY VENOUS REFLUX EXAM   INDICATION:  Left  lower extremity varicose veins with pain and  swelling.   EXAM:  Using color-flow imaging and pulse Doppler spectral analysis, the  left common femoral, superficial femoral, popliteal, posterior tibial,  greater and lesser saphenous veins were evaluated.  There was no  evidence suggesting deep venous insufficiency in the left lower  extremity.   The left saphenofemoral junction is competent.  The left GSV is  competent.   The left proximal short saphenous vein demonstrates incompetency.   GSV Diameter (used if found to be incompetent only)                                            Right         Left  Proximal Greater Saphenous Vein           cm            cm  Proximal-to-mid-thigh                     cm            cm  Mid thigh                                 cm            cm  Mid-distal thigh                          cm            cm  Distal thigh                              cm            cm  Knee                                      cm            cm   PROXIMAL LESSER SAPHENOUS VEIN LEFT:  0.81   MID-LEFT:  0.56   DISTAL LEFT:  0.55   IMPRESSION:  Left greater saphenous is competent.  The left greater  saphenous vein is not tortuous.  The deep venous system is competent.  The left short saphenous vein is not competent with reflux of >500  milliseconds.     ___________________________________________  Quita Skye Hart [place], M.D.   OD/MEDQ  D:  07/14/2010  T:  07/14/2010  Job:  [id number]

## 2011-01-12 NOTE — Consult Note (Signed)
NEW PATIENT CONSULTATION   Ramos, Judy  DOB:  [birth year]                                       05/05/2010  ZOXWR#:[id number]   Ms. Lubinski is a 50 year old, healthy female referred by Dr. Laury Axon for  venous insufficiency of the left leg.  This patient states that since  pregnancy she has had some varicose veins in the left calf and over the  last few years they have increased in size and symptomatology.  She now  describes an aching, burning, throbbing discomfort in the left calf the  more she is on her feet with some swelling and tightness in the calf as  the day progresses.  She was prescribed long-leg elastic compression  stockings on August 11 by Dr. Laury Axon which she has been wearing with no  improvement.  She also elevates the legs when she can and occasionally  take pain medication.  She has had no history of bleeding ulceration,  thrombophlebitis or deep vein thrombosis.   CHRONIC MEDICAL PROBLEMS:  1. Cholelithiasis status post cholecystectomy.  2. Hyperlipidemia.  3. Hypothyroidism.  4. Negative for diabetes, hypertension, COPD, stroke or coronary      artery disease.   SOCIAL HISTORY:  She is married, has two children, works as an  Therapist, occupational.  Does not use tobacco and drinks occasional  alcohol.   FAMILY HISTORY:  Negative for coronary artery disease, diabetes or  stroke.   REVIEW OF SYSTEMS:  Positive for weight gain, change in her vision.  Negative for chest pain, dyspnea on exertion, PND, orthopnea, chronic  bronchitis.  Does have chronic occasional constipation.  All other  systems in the review of systems are negative.   PHYSICAL EXAMINATION:  Blood pressure 113/72, heart rate 95, temperature  98.4.  Generally, she is a well-developed, well-nourished middle-aged  female in no apparent stress, alert and oriented x3.  HEENT:  Exam  is  normal for age.  EOMs intact.  Lungs:  Clear to auscultation.  No  rhonchi or wheezing.   Cardiovascular:  Regular rhythm.  No murmurs.  Carotid pulses are 3+.  No bruits.  Abdomen:  Soft, nontender with no  palpable masses.  Musculoskeletal:  Exam is free of major deformities.  Neurologic:  Normal.  Skin:  Free of rashes.  Lower extremity:  Exam  reveals 3+ femoral popliteal and dorsalis pedis pulses bilaterally.  She  has bulging varicosities in the left posterior calf extending up to the  popliteal crease, down into the mid calf area.  There is no  hyperpigmentation or ulceration noted.  She has some early varicosities  in the right posterior calf which are in the reticular vein size.   Today, I performed a venous duplex exam with the SonoSite at the bedside  to look at her left small saphenous vein.  It appears to have some  reflux near the junction but is not a large vessel extending down into  the mid calf.   I feel that she does have symptomatic varicosities in the left calf and  we will continue her treatment with long-leg elastic compression  stockings as well as elevation and ibuprofen.  She will return in  another 2 months which will be 3 months from the beginning of her  treatment which was instituted by Dr. Laury Axon.  At that time we will  perform  a formal venous Duplex exam to see what options are available to  treat these varicosities which are symptomatic in her left leg and  effecting her daily living.     Quita Skye Hart [place], M.D.  Electronically Signed   JDL/MEDQ  D:  05/05/2010  T:  05/06/2010  Job:  [id number]   cc:   Lelon Perla, DO

## 2011-03-09 ENCOUNTER — Other Ambulatory Visit: Payer: Self-pay | Admitting: Family Medicine

## 2011-03-09 NOTE — Telephone Encounter (Signed)
Letter mailed     KP 

## 2011-04-07 ENCOUNTER — Other Ambulatory Visit: Payer: Self-pay | Admitting: Family Medicine

## 2011-04-07 NOTE — Telephone Encounter (Signed)
Great! con't meds---recheck 6 months 272.4 hep, lipid, bmp TSH 244.9  Copied from 06/2010

## 2011-05-04 ENCOUNTER — Other Ambulatory Visit: Payer: Self-pay | Admitting: Family Medicine

## 2011-05-05 MED ORDER — ZOLPIDEM TARTRATE ER 12.5 MG PO TBCR: 12.5000 mg | EXTENDED_RELEASE_TABLET | Freq: Every evening | ORAL | Status: DC | PRN

## 2011-05-05 NOTE — Telephone Encounter (Signed)
Last seen 12/03/10 and filled 12/23/10 please advise     KP

## 2011-05-05 NOTE — Telephone Encounter (Signed)
Faxed.   KP 

## 2011-05-08 ENCOUNTER — Other Ambulatory Visit: Payer: Self-pay | Admitting: Internal Medicine

## 2011-05-10 NOTE — Telephone Encounter (Signed)
Patient due for labs.

## 2011-05-17 ENCOUNTER — Ambulatory Visit (INDEPENDENT_AMBULATORY_CARE_PROVIDER_SITE_OTHER): Payer: PRIVATE HEALTH INSURANCE | Admitting: Family Medicine

## 2011-05-17 ENCOUNTER — Encounter: Payer: Self-pay | Admitting: Family Medicine

## 2011-05-17 VITALS — BP 112/68 | HR 72 | Temp 97.3°F | Ht 66.5 in | Wt 148.8 lb

## 2011-05-17 DIAGNOSIS — Z0000 Encounter for general adult medical examination without abnormal findings: Secondary | ICD-10-CM

## 2011-05-17 DIAGNOSIS — E039 Hypothyroidism, unspecified: Secondary | ICD-10-CM

## 2011-05-17 DIAGNOSIS — R319 Hematuria, unspecified: Secondary | ICD-10-CM

## 2011-05-17 LAB — CBC WITH DIFFERENTIAL/PLATELET
Basophils Absolute: 0 10*3/uL (ref 0.0–0.1)
Eosinophils Absolute: 0 10*3/uL (ref 0.0–0.7)
HCT: 41.4 % (ref 36.0–46.0)
Hemoglobin: 13.9 g/dL (ref 12.0–15.0)
Lymphs Abs: 2.6 10*3/uL (ref 0.7–4.0)
MCHC: 33.6 g/dL (ref 30.0–36.0)
Neutro Abs: 4.1 10*3/uL (ref 1.4–7.7)
Platelets: 242 10*3/uL (ref 150.0–400.0)
RDW: 13.4 % (ref 11.5–14.6)

## 2011-05-17 LAB — BASIC METABOLIC PANEL
BUN: 13 mg/dL (ref 6–23)
CO2: 26 mEq/L (ref 19–32)
Calcium: 9.2 mg/dL (ref 8.4–10.5)
Glucose, Bld: 84 mg/dL (ref 70–99)
Potassium: 3.8 mEq/L (ref 3.5–5.1)
Sodium: 136 mEq/L (ref 135–145)

## 2011-05-17 LAB — HEPATIC FUNCTION PANEL
Albumin: 4.2 g/dL (ref 3.5–5.2)
Total Bilirubin: 0.9 mg/dL (ref 0.3–1.2)

## 2011-05-17 LAB — POCT URINALYSIS DIPSTICK
Clarity, UA: NEGATIVE
Ketones, UA: NEGATIVE
Protein, UA: NEGATIVE
Spec Grav, UA: 1.005
pH, UA: 5

## 2011-05-17 LAB — LIPID PANEL
Cholesterol: 198 mg/dL (ref 0–200)
HDL: 67 mg/dL (ref >39.00)
Triglycerides: 61 mg/dL (ref 0.0–149.0)
VLDL: 12.2 mg/dL (ref 0.0–40.0)

## 2011-05-17 LAB — TSH: TSH: 0.58 u[IU]/mL (ref 0.35–5.50)

## 2011-05-17 NOTE — Patient Instructions (Signed)

## 2011-05-17 NOTE — Progress Notes (Signed)
Subjective:     Judy Ramos is a 50 y.o. female and is here for a comprehensive physical exam. The patient reports no problems.  History   Social History  . Marital Status: Married    Spouse Name: N/A    Number of Children: N/A  . Years of Education: N/A   Occupational History  . [employer]--underwriting assistant    Social History Main Topics  . Smoking status: Never Smoker   . Smokeless tobacco: Not on file  . Alcohol Use: 8.4 oz/week    14 Glasses of wine per week  . Drug Use: No  . Sexually Active: Yes -- Female partner(s)   Other Topics Concern  . Not on file   Social History Narrative   Regular exercise- yes    Health Maintenance  Topic Date Due  . Pap Smear  10/16/2013  . Tetanus/tdap  10/14/2018    The following portions of the patient's history were reviewed and updated as appropriate: allergies, current medications, past family history, past medical history, past social history, past surgical history and problem list.  Review of Systems Review of Systems  Constitutional: Negative for activity change, appetite change and fatigue.  HENT: Negative for hearing loss, congestion, tinnitus and ear discharge.  dentist q28m Eyes: Negative for visual disturbance (see optho q1y -- vision corrected to 20/20 with glasses).  Respiratory: Negative for cough, chest tightness and shortness of breath.   Cardiovascular: Negative for chest pain, palpitations and leg swelling.  Gastrointestinal: Negative for abdominal pain, diarrhea, constipation and abdominal distention.  Genitourinary: Negative for urgency, frequency, decreased urine volume and difficulty urinating.  Musculoskeletal: Negative for back pain, arthralgias and gait problem.  Skin: Negative for color change, pallor and rash.  Neurological: Negative for dizziness, light-headedness, numbness and headaches.  Hematological: Negative for adenopathy. Does not bruise/bleed easily.  Psychiatric/Behavioral: Negative for  suicidal ideas, confusion, sleep disturbance, self-injury, dysphoric mood, decreased concentration and agitation.       Objective:    BP 112/68  Pulse 72  Temp(Src) 97.3 F (36.3 C) (Oral)  Ht 5' 6.5" (1.689 m)  Wt 148 lb 12.8 oz (67.495 kg)  BMI 23.66 kg/m2  SpO2 97%  General Appearance:    Alert, cooperative, no distress, appears stated age  Head:    Normocephalic, without obvious abnormality, atraumatic  Eyes:    PERRL, conjunctiva/corneas clear, EOM's intact, fundi    benign, both eyes  Ears:    Normal TM's and external ear canals, both ears  Nose:   Nares normal, septum midline, mucosa normal, no drainage    or sinus tenderness  Throat:   Lips, mucosa, and tongue normal; teeth and gums normal  Neck:   Supple, symmetrical, trachea midline, no adenopathy;    thyroid:  no enlargement/tenderness/nodules; no carotid   bruit or JVD  Back:     Symmetric, no curvature, ROM normal, no CVA tenderness  Lungs:     Clear to auscultation bilaterally, respirations unlabored  Chest Wall:    No tenderness or deformity   Heart:    Regular rate and rhythm, S1 and S2 normal, no murmur, rub   or gallop  Breast Exam:   gyn  Abdomen:     Soft, non-tender, bowel sounds active all four quadrants,    no masses, no organomegaly  Genitalia:  gyn  Rectal:    gyn  Extremities:   Extremities normal, atraumatic, no cyanosis or edema  Pulses:   2+ and symmetric all extremities  Skin:  Skin color, texture, turgor normal, no rashes or lesions  Lymph nodes:   Cervical, supraclavicular, and axillary nodes normal  Neurologic:   CNII-XII intact, normal strength, sensation and reflexes    throughout      Assessment:    Healthy female exam.  Hypothyroidism-- check labs con't meds Hyperlipidemia---check labs , con't meds     Plan:  ghm utd Check fasting labs   See After Visit Summary for Counseling Recommendations

## 2011-05-17 NOTE — Progress Notes (Signed)
Addended by: Aseem Sessums K on: 05/17/2011 01:46 PM   Modules accepted: Orders  

## 2011-08-06 ENCOUNTER — Other Ambulatory Visit: Payer: Self-pay | Admitting: Internal Medicine

## 2011-08-28 ENCOUNTER — Other Ambulatory Visit: Payer: Self-pay | Admitting: Internal Medicine

## 2011-11-12 ENCOUNTER — Other Ambulatory Visit: Payer: Self-pay | Admitting: Obstetrics and Gynecology

## 2011-12-13 ENCOUNTER — Other Ambulatory Visit: Payer: Self-pay | Admitting: Internal Medicine

## 2011-12-13 NOTE — Telephone Encounter (Signed)
LIPID/HEP 272.4/995.20  

## 2012-02-01 ENCOUNTER — Other Ambulatory Visit: Payer: Self-pay | Admitting: Family Medicine

## 2012-03-13 ENCOUNTER — Other Ambulatory Visit: Payer: Self-pay | Admitting: Family Medicine

## 2012-06-20 ENCOUNTER — Other Ambulatory Visit: Payer: Self-pay | Admitting: Family Medicine

## 2012-08-03 ENCOUNTER — Other Ambulatory Visit: Payer: Self-pay | Admitting: Family Medicine

## 2012-09-04 ENCOUNTER — Other Ambulatory Visit: Payer: Self-pay | Admitting: Family Medicine

## 2012-09-04 NOTE — Telephone Encounter (Signed)
Patient has not had labs done or been seen since 05/17/11. No upcoming apts         Please advise     KP

## 2012-09-30 ENCOUNTER — Other Ambulatory Visit: Payer: Self-pay | Admitting: Family Medicine

## 2012-11-14 ENCOUNTER — Encounter: Payer: PRIVATE HEALTH INSURANCE | Admitting: Family Medicine

## 2012-11-14 ENCOUNTER — Other Ambulatory Visit: Payer: Self-pay | Admitting: Family Medicine

## 2012-11-21 ENCOUNTER — Encounter: Payer: Self-pay | Admitting: Family Medicine

## 2012-11-21 ENCOUNTER — Ambulatory Visit (INDEPENDENT_AMBULATORY_CARE_PROVIDER_SITE_OTHER): Payer: Self-pay | Admitting: Family Medicine

## 2012-11-21 VITALS — BP 120/72 | HR 66 | Temp 98.2°F | Ht 66.0 in | Wt 141.0 lb

## 2012-11-21 DIAGNOSIS — Z0000 Encounter for general adult medical examination without abnormal findings: Secondary | ICD-10-CM

## 2012-11-21 NOTE — Progress Notes (Signed)
Subjective:     Natividad Schlosser is a 52 y.o. female and is here for a comprehensive physical exam. The patient reports no problems.  History   Social History  . Marital Status: Married    Spouse Name: N/A    Number of Children: N/A  . Years of Education: N/A   Occupational History  .      p/t with husband   Social History Main Topics  . Smoking status: Never Smoker   . Smokeless tobacco: Not on file  . Alcohol Use: 8.4 oz/week    14 Glasses of wine per week  . Drug Use: No  . Sexually Active: Yes -- Female partner(s)   Other Topics Concern  . Not on file   Social History Narrative   Regular exercise- yes    Health Maintenance  Topic Date Due  . Influenza Vaccine  04/30/1964  . Pap Smear  10/27/2015  . Tetanus/tdap  10/14/2018    The following portions of the patient's history were reviewed and updated as appropriate:  She  has a past medical history of Gallstone; Hypothyroidism; and Thyroid nodule. She  does not have any pertinent problems on file. She  has past surgical history that includes Cesarean section; LASIK; Cholecystectomy (05/1999); Breast enhancement surgery (2001); uterine ablation (2011); Dilation and curettage of uterus (2011); and Vein ligation and stripping. Her family history includes Arthritis in her father and mother; Cancer in her paternal grandfather; Hypertension in her father and mother; Lymphoma in her paternal uncle; Neuropathy in an unspecified family member; and Stroke in her paternal grandmother. She  reports that she has never smoked. She does not have any smokeless tobacco history on file. She reports that she drinks about 8.4 ounces of alcohol per week. She reports that she does not use illicit drugs. She has a current medication list which includes the following prescription(s): calcium carbonate, cholecalciferol, levothyroxine, norethin-eth estradiol-fe, and pravastatin. Current Outpatient Prescriptions on File Prior to Visit  Medication  Sig Dispense Refill  . Calcium Carbonate (CALCARB 600) 1500 MG TABS Take by mouth.        . cholecalciferol (VITAMIN D) 1000 UNITS tablet Take 1,000 Units by mouth daily.        Marland Kitchen levothyroxine (SYNTHROID, LEVOTHROID) 50 MCG tablet TAKE ONE TABLET BY MOUTH EVERY DAY  30 tablet  0  . Norethin-Eth Estradiol-Fe (GENERESS FE PO) Take by mouth daily.        . pravastatin (PRAVACHOL) 40 MG tablet TAKE ONE TABLET BY MOUTH EVERY DAY  30 tablet  0   No current facility-administered medications on file prior to visit.   She has No Known Allergies..  Review of Systems Review of Systems  Constitutional: Negative for activity change, appetite change and fatigue.  HENT: Negative for hearing loss, congestion, tinnitus and ear discharge.  dentist q55m Eyes: Negative for visual disturbance (see optho q1y -- vision corrected to 20/20 with glasses).  Respiratory: Negative for cough, chest tightness and shortness of breath.   Cardiovascular: Negative for chest pain, palpitations and leg swelling.  Gastrointestinal: Negative for abdominal pain, diarrhea, constipation and abdominal distention.  Genitourinary: Negative for urgency, frequency, decreased urine volume and difficulty urinating.  Musculoskeletal: Negative for back pain, arthralgias and gait problem.  Skin: Negative for color change, pallor and rash.  Neurological: Negative for dizziness, light-headedness, numbness and headaches.  Hematological: Negative for adenopathy. Does not bruise/bleed easily.  Psychiatric/Behavioral: Negative for suicidal ideas, confusion, sleep disturbance, self-injury, dysphoric mood, decreased concentration and agitation.  Objective:    BP 120/72  Pulse 66  Temp(Src) 98.2 F (36.8 C) (Oral)  Ht 5\' 6"  (1.676 m)  Wt 141 lb (63.957 kg)  BMI 22.77 kg/m2  SpO2 99% General appearance: alert, cooperative, appears stated age and no distress Head: Normocephalic, without obvious abnormality, atraumatic Eyes:  conjunctivae/corneas clear. PERRL, EOM's intact. Fundi benign. Ears: normal TM's and external ear canals both ears Nose: Nares normal. Septum midline. Mucosa normal. No drainage or sinus tenderness. Throat: lips, mucosa, and tongue normal; teeth and gums normal Neck: no adenopathy, no carotid bruit, no JVD, supple, symmetrical, trachea midline and thyroid not enlarged, symmetric, no tenderness/mass/nodules Back: symmetric, no curvature. ROM normal. No CVA tenderness. Lungs: clear to auscultation bilaterally Breasts: gyn Heart: regular rate and rhythm, S1, S2 normal, no murmur, click, rub or gallop Abdomen: soft, non-tender; bowel sounds normal; no masses,  no organomegaly Pelvic: deferred--gyn Extremities: extremities normal, atraumatic, no cyanosis or edema Pulses: 2+ and symmetric Skin: Skin color, texture, turgor normal. No rashes or lesions Lymph nodes: Cervical, supraclavicular, and axillary nodes normal. Neurologic: Alert and oriented X 3, normal strength and tone. Normal symmetric reflexes. Normal coordination and gait Psych-- no depression no anxiety   Assessment:    Healthy female exam.      Plan:    ghm utd  Check labs See After Visit Summary for Counseling Recommendations

## 2012-11-21 NOTE — Patient Instructions (Addendum)
Preventive Care for Adults, Female A healthy lifestyle and preventive care can promote health and wellness. Preventive health guidelines for women include the following key practices.  A routine yearly physical is a good way to check with your caregiver about your health and preventive screening. It is a chance to share any concerns and updates on your health, and to receive a thorough exam.  Visit your dentist for a routine exam and preventive care every 6 months. Brush your teeth twice a day and floss once a day. Good oral hygiene prevents tooth decay and gum disease.  The frequency of eye exams is based on your age, health, family medical history, use of contact lenses, and other factors. Follow your caregiver's recommendations for frequency of eye exams.  Eat a healthy diet. Foods like vegetables, fruits, whole grains, low-fat dairy products, and lean protein foods contain the nutrients you need without too many calories. Decrease your intake of foods high in solid fats, added sugars, and salt. Eat the right amount of calories for you.Get information about a proper diet from your caregiver, if necessary.  Regular physical exercise is one of the most important things you can do for your health. Most adults should get at least 150 minutes of moderate-intensity exercise (any activity that increases your heart rate and causes you to sweat) each week. In addition, most adults need muscle-strengthening exercises on 2 or more days a week.  Maintain a healthy weight. The body mass index (BMI) is a screening tool to identify possible weight problems. It provides an estimate of body fat based on height and weight. Your caregiver can help determine your BMI, and can help you achieve or maintain a healthy weight.For adults 20 years and older:  A BMI below 18.5 is considered underweight.  A BMI of 18.5 to 24.9 is normal.  A BMI of 25 to 29.9 is considered overweight.  A BMI of 30 and above is  considered obese.  Maintain normal blood lipids and cholesterol levels by exercising and minimizing your intake of saturated fat. Eat a balanced diet with plenty of fruit and vegetables. Blood tests for lipids and cholesterol should begin at age 20 and be repeated every 5 years. If your lipid or cholesterol levels are high, you are over 50, or you are at high risk for heart disease, you may need your cholesterol levels checked more frequently.Ongoing high lipid and cholesterol levels should be treated with medicines if diet and exercise are not effective.  If you smoke, find out from your caregiver how to quit. If you do not use tobacco, do not start.  If you are pregnant, do not drink alcohol. If you are breastfeeding, be very cautious about drinking alcohol. If you are not pregnant and choose to drink alcohol, do not exceed 1 drink per day. One drink is considered to be 12 ounces (355 mL) of beer, 5 ounces (148 mL) of wine, or 1.5 ounces (44 mL) of liquor.  Avoid use of street drugs. Do not share needles with anyone. Ask for help if you need support or instructions about stopping the use of drugs.  High blood pressure causes heart disease and increases the risk of stroke. Your blood pressure should be checked at least every 1 to 2 years. Ongoing high blood pressure should be treated with medicines if weight loss and exercise are not effective.  If you are 55 to 50 years old, ask your caregiver if you should take aspirin to prevent strokes.  Diabetes   screening involves taking a blood sample to check your fasting blood sugar level. This should be done once every 3 years, after age 45, if you are within normal weight and without risk factors for diabetes. Testing should be considered at a younger age or be carried out more frequently if you are overweight and have at least 1 risk factor for diabetes.  Breast cancer screening is essential preventive care for women. You should practice "breast  self-awareness." This means understanding the normal appearance and feel of your breasts and may include breast self-examination. Any changes detected, no matter how small, should be reported to a caregiver. Women in their 20s and 30s should have a clinical breast exam (CBE) by a caregiver as part of a regular health exam every 1 to 3 years. After age 40, women should have a CBE every year. Starting at age 40, women should consider having a mammography (breast X-ray test) every year. Women who have a family history of breast cancer should talk to their caregiver about genetic screening. Women at a high risk of breast cancer should talk to their caregivers about having magnetic resonance imaging (MRI) and a mammography every year.  The Pap test is a screening test for cervical cancer. A Pap test can show cell changes on the cervix that might become cervical cancer if left untreated. A Pap test is a procedure in which cells are obtained and examined from the lower end of the uterus (cervix).  Women should have a Pap test starting at age 21.  Between ages 21 and 29, Pap tests should be repeated every 2 years.  Beginning at age 30, you should have a Pap test every 3 years as long as the past 3 Pap tests have been normal.  Some women have medical problems that increase the chance of getting cervical cancer. Talk to your caregiver about these problems. It is especially important to talk to your caregiver if a new problem develops soon after your last Pap test. In these cases, your caregiver may recommend more frequent screening and Pap tests.  The above recommendations are the same for women who have or have not gotten the vaccine for human papillomavirus (HPV).  If you had a hysterectomy for a problem that was not cancer or a condition that could lead to cancer, then you no longer need Pap tests. Even if you no longer need a Pap test, a regular exam is a good idea to make sure no other problems are  starting.  If you are between ages 65 and 70, and you have had normal Pap tests going back 10 years, you no longer need Pap tests. Even if you no longer need a Pap test, a regular exam is a good idea to make sure no other problems are starting.  If you have had past treatment for cervical cancer or a condition that could lead to cancer, you need Pap tests and screening for cancer for at least 20 years after your treatment.  If Pap tests have been discontinued, risk factors (such as a new sexual partner) need to be reassessed to determine if screening should be resumed.  The HPV test is an additional test that may be used for cervical cancer screening. The HPV test looks for the virus that can cause the cell changes on the cervix. The cells collected during the Pap test can be tested for HPV. The HPV test could be used to screen women aged 30 years and older, and should   be used in women of any age who have unclear Pap test results. After the age of 30, women should have HPV testing at the same frequency as a Pap test.  Colorectal cancer can be detected and often prevented. Most routine colorectal cancer screening begins at the age of 50 and continues through age 75. However, your caregiver may recommend screening at an earlier age if you have risk factors for colon cancer. On a yearly basis, your caregiver may provide home test kits to check for hidden blood in the stool. Use of a small camera at the end of a tube, to directly examine the colon (sigmoidoscopy or colonoscopy), can detect the earliest forms of colorectal cancer. Talk to your caregiver about this at age 50, when routine screening begins. Direct examination of the colon should be repeated every 5 to 10 years through age 75, unless early forms of pre-cancerous polyps or small growths are found.  Hepatitis C blood testing is recommended for all people born from 1945 through 1965 and any individual with known risks for hepatitis C.  Practice  safe sex. Use condoms and avoid high-risk sexual practices to reduce the spread of sexually transmitted infections (STIs). STIs include gonorrhea, chlamydia, syphilis, trichomonas, herpes, HPV, and human immunodeficiency virus (HIV). Herpes, HIV, and HPV are viral illnesses that have no cure. They can result in disability, cancer, and death. Sexually active women aged 25 and younger should be checked for chlamydia. Older women with new or multiple partners should also be tested for chlamydia. Testing for other STIs is recommended if you are sexually active and at increased risk.  Osteoporosis is a disease in which the bones lose minerals and strength with aging. This can result in serious bone fractures. The risk of osteoporosis can be identified using a bone density scan. Women ages 65 and over and women at risk for fractures or osteoporosis should discuss screening with their caregivers. Ask your caregiver whether you should take a calcium supplement or vitamin D to reduce the rate of osteoporosis.  Menopause can be associated with physical symptoms and risks. Hormone replacement therapy is available to decrease symptoms and risks. You should talk to your caregiver about whether hormone replacement therapy is right for you.  Use sunscreen with sun protection factor (SPF) of 30 or more. Apply sunscreen liberally and repeatedly throughout the day. You should seek shade when your shadow is shorter than you. Protect yourself by wearing long sleeves, pants, a wide-brimmed hat, and sunglasses year round, whenever you are outdoors.  Once a month, do a whole body skin exam, using a mirror to look at the skin on your back. Notify your caregiver of new moles, moles that have irregular borders, moles that are larger than a pencil eraser, or moles that have changed in shape or color.  Stay current with required immunizations.  Influenza. You need a dose every fall (or winter). The composition of the flu vaccine  changes each year, so being vaccinated once is not enough.  Pneumococcal polysaccharide. You need 1 to 2 doses if you smoke cigarettes or if you have certain chronic medical conditions. You need 1 dose at age 65 (or older) if you have never been vaccinated.  Tetanus, diphtheria, pertussis (Tdap, Td). Get 1 dose of Tdap vaccine if you are younger than age 65, are over 65 and have contact with an infant, are a healthcare worker, are pregnant, or simply want to be protected from whooping cough. After that, you need a Td   booster dose every 10 years. Consult your caregiver if you have not had at least 3 tetanus and diphtheria-containing shots sometime in your life or have a deep or dirty wound.  HPV. You need this vaccine if you are a woman age 26 or younger. The vaccine is given in 3 doses over 6 months.  Measles, mumps, rubella (MMR). You need at least 1 dose of MMR if you were born in 1957 or later. You may also need a second dose.  Meningococcal. If you are age 19 to 21 and a first-year college student living in a residence hall, or have one of several medical conditions, you need to get vaccinated against meningococcal disease. You may also need additional booster doses.  Zoster (shingles). If you are age 60 or older, you should get this vaccine.  Varicella (chickenpox). If you have never had chickenpox or you were vaccinated but received only 1 dose, talk to your caregiver to find out if you need this vaccine.  Hepatitis A. You need this vaccine if you have a specific risk factor for hepatitis A virus infection or you simply wish to be protected from this disease. The vaccine is usually given as 2 doses, 6 to 18 months apart.  Hepatitis B. You need this vaccine if you have a specific risk factor for hepatitis B virus infection or you simply wish to be protected from this disease. The vaccine is given in 3 doses, usually over 6 months. Preventive Services / Frequency Ages 19 to 39  Blood  pressure check.** / Every 1 to 2 years.  Lipid and cholesterol check.** / Every 5 years beginning at age 20.  Clinical breast exam.** / Every 3 years for women in their 20s and 30s.  Pap test.** / Every 2 years from ages 21 through 29. Every 3 years starting at age 30 through age 65 or 70 with a history of 3 consecutive normal Pap tests.  HPV screening.** / Every 3 years from ages 30 through ages 65 to 70 with a history of 3 consecutive normal Pap tests.  Hepatitis C blood test.** / For any individual with known risks for hepatitis C.  Skin self-exam. / Monthly.  Influenza immunization.** / Every year.  Pneumococcal polysaccharide immunization.** / 1 to 2 doses if you smoke cigarettes or if you have certain chronic medical conditions.  Tetanus, diphtheria, pertussis (Tdap, Td) immunization. / A one-time dose of Tdap vaccine. After that, you need a Td booster dose every 10 years.  HPV immunization. / 3 doses over 6 months, if you are 26 and younger.  Measles, mumps, rubella (MMR) immunization. / You need at least 1 dose of MMR if you were born in 1957 or later. You may also need a second dose.  Meningococcal immunization. / 1 dose if you are age 19 to 21 and a first-year college student living in a residence hall, or have one of several medical conditions, you need to get vaccinated against meningococcal disease. You may also need additional booster doses.  Varicella immunization.** / Consult your caregiver.  Hepatitis A immunization.** / Consult your caregiver. 2 doses, 6 to 18 months apart.  Hepatitis B immunization.** / Consult your caregiver. 3 doses usually over 6 months. Ages 40 to 64  Blood pressure check.** / Every 1 to 2 years.  Lipid and cholesterol check.** / Every 5 years beginning at age 20.  Clinical breast exam.** / Every year after age 40.  Mammogram.** / Every year beginning at age 40   and continuing for as long as you are in good health. Consult with your  caregiver.  Pap test.** / Every 3 years starting at age 30 through age 65 or 70 with a history of 3 consecutive normal Pap tests.  HPV screening.** / Every 3 years from ages 30 through ages 65 to 70 with a history of 3 consecutive normal Pap tests.  Fecal occult blood test (FOBT) of stool. / Every year beginning at age 50 and continuing until age 75. You may not need to do this test if you get a colonoscopy every 10 years.  Flexible sigmoidoscopy or colonoscopy.** / Every 5 years for a flexible sigmoidoscopy or every 10 years for a colonoscopy beginning at age 50 and continuing until age 75.  Hepatitis C blood test.** / For all people born from 1945 through 1965 and any individual with known risks for hepatitis C.  Skin self-exam. / Monthly.  Influenza immunization.** / Every year.  Pneumococcal polysaccharide immunization.** / 1 to 2 doses if you smoke cigarettes or if you have certain chronic medical conditions.  Tetanus, diphtheria, pertussis (Tdap, Td) immunization.** / A one-time dose of Tdap vaccine. After that, you need a Td booster dose every 10 years.  Measles, mumps, rubella (MMR) immunization. / You need at least 1 dose of MMR if you were born in 1957 or later. You may also need a second dose.  Varicella immunization.** / Consult your caregiver.  Meningococcal immunization.** / Consult your caregiver.  Hepatitis A immunization.** / Consult your caregiver. 2 doses, 6 to 18 months apart.  Hepatitis B immunization.** / Consult your caregiver. 3 doses, usually over 6 months. Ages 65 and over  Blood pressure check.** / Every 1 to 2 years.  Lipid and cholesterol check.** / Every 5 years beginning at age 20.  Clinical breast exam.** / Every year after age 40.  Mammogram.** / Every year beginning at age 40 and continuing for as long as you are in good health. Consult with your caregiver.  Pap test.** / Every 3 years starting at age 30 through age 65 or 70 with a 3  consecutive normal Pap tests. Testing can be stopped between 65 and 70 with 3 consecutive normal Pap tests and no abnormal Pap or HPV tests in the past 10 years.  HPV screening.** / Every 3 years from ages 30 through ages 65 or 70 with a history of 3 consecutive normal Pap tests. Testing can be stopped between 65 and 70 with 3 consecutive normal Pap tests and no abnormal Pap or HPV tests in the past 10 years.  Fecal occult blood test (FOBT) of stool. / Every year beginning at age 50 and continuing until age 75. You may not need to do this test if you get a colonoscopy every 10 years.  Flexible sigmoidoscopy or colonoscopy.** / Every 5 years for a flexible sigmoidoscopy or every 10 years for a colonoscopy beginning at age 50 and continuing until age 75.  Hepatitis C blood test.** / For all people born from 1945 through 1965 and any individual with known risks for hepatitis C.  Osteoporosis screening.** / A one-time screening for women ages 65 and over and women at risk for fractures or osteoporosis.  Skin self-exam. / Monthly.  Influenza immunization.** / Every year.  Pneumococcal polysaccharide immunization.** / 1 dose at age 65 (or older) if you have never been vaccinated.  Tetanus, diphtheria, pertussis (Tdap, Td) immunization. / A one-time dose of Tdap vaccine if you are over   65 and have contact with an infant, are a healthcare worker, or simply want to be protected from whooping cough. After that, you need a Td booster dose every 10 years.  Varicella immunization.** / Consult your caregiver.  Meningococcal immunization.** / Consult your caregiver.  Hepatitis A immunization.** / Consult your caregiver. 2 doses, 6 to 18 months apart.  Hepatitis B immunization.** / Check with your caregiver. 3 doses, usually over 6 months. ** Family history and personal history of risk and conditions may change your caregiver's recommendations. Document Released: 10/12/2001 Document Revised: 11/08/2011  Document Reviewed: 01/11/2011 [employer] Patient Information 2013 [employer].  

## 2012-11-24 ENCOUNTER — Other Ambulatory Visit (INDEPENDENT_AMBULATORY_CARE_PROVIDER_SITE_OTHER): Payer: Self-pay

## 2012-11-24 DIAGNOSIS — Z0000 Encounter for general adult medical examination without abnormal findings: Secondary | ICD-10-CM

## 2012-11-24 LAB — BASIC METABOLIC PANEL
CO2: 25 mEq/L (ref 19–32)
Chloride: 101 mEq/L (ref 96–112)
Potassium: 4 mEq/L (ref 3.5–5.1)
Sodium: 135 mEq/L (ref 135–145)

## 2012-11-24 LAB — CBC WITH DIFFERENTIAL/PLATELET
Basophils Relative: 0.8 % (ref 0.0–3.0)
Eosinophils Absolute: 0 10*3/uL (ref 0.0–0.7)
Eosinophils Relative: 0 % (ref 0.0–5.0)
Hemoglobin: 13.5 g/dL (ref 12.0–15.0)
MCHC: 33.7 g/dL (ref 30.0–36.0)
MCV: 98.2 fl (ref 78.0–100.0)
Monocytes Absolute: 0.6 10*3/uL (ref 0.1–1.0)
Neutro Abs: 4.1 10*3/uL (ref 1.4–7.7)
Neutrophils Relative %: 61.1 % (ref 43.0–77.0)
RBC: 4.08 Mil/uL (ref 3.87–5.11)
WBC: 6.8 10*3/uL (ref 4.5–10.5)

## 2012-11-24 LAB — HEPATIC FUNCTION PANEL
Albumin: 4.1 g/dL (ref 3.5–5.2)
Alkaline Phosphatase: 33 U/L — ABNORMAL LOW (ref 39–117)
Total Protein: 7.6 g/dL (ref 6.0–8.3)

## 2012-11-24 LAB — LIPID PANEL
Cholesterol: 168 mg/dL (ref 0–200)
HDL: 65.2 mg/dL (ref >39.00)
LDL Cholesterol: 92 mg/dL (ref 0–99)
Total CHOL/HDL Ratio: 3
Triglycerides: 56 mg/dL (ref 0.0–149.0)
VLDL: 11.2 mg/dL (ref 0.0–40.0)

## 2012-11-24 LAB — TSH: TSH: 0.82 u[IU]/mL (ref 0.35–5.50)

## 2012-11-28 LAB — HM MAMMOGRAPHY

## 2012-12-11 ENCOUNTER — Other Ambulatory Visit: Payer: Self-pay | Admitting: Family Medicine

## 2012-12-22 ENCOUNTER — Ambulatory Visit: Payer: PRIVATE HEALTH INSURANCE | Admitting: Family Medicine

## 2013-01-02 ENCOUNTER — Encounter: Payer: PRIVATE HEALTH INSURANCE | Admitting: Family Medicine

## 2013-02-12 ENCOUNTER — Other Ambulatory Visit: Payer: Self-pay | Admitting: Obstetrics and Gynecology

## 2013-04-23 ENCOUNTER — Ambulatory Visit (INDEPENDENT_AMBULATORY_CARE_PROVIDER_SITE_OTHER): Payer: PRIVATE HEALTH INSURANCE | Admitting: *Deleted

## 2013-04-23 DIAGNOSIS — Z111 Encounter for screening for respiratory tuberculosis: Secondary | ICD-10-CM

## 2013-04-25 LAB — TB SKIN TEST: Induration: 0 mm

## 2013-06-04 ENCOUNTER — Other Ambulatory Visit: Payer: Self-pay | Admitting: *Deleted

## 2013-06-04 DIAGNOSIS — E039 Hypothyroidism, unspecified: Secondary | ICD-10-CM

## 2013-06-04 MED ORDER — LEVOTHYROXINE SODIUM 50 MCG PO TABS: ORAL_TABLET | ORAL | Status: DC

## 2013-06-04 NOTE — Telephone Encounter (Signed)
Refill for levothyroxine sent to [facility]

## 2013-07-05 ENCOUNTER — Other Ambulatory Visit: Payer: Self-pay

## 2013-07-05 ENCOUNTER — Other Ambulatory Visit: Payer: Self-pay | Admitting: Family Medicine

## 2013-08-10 ENCOUNTER — Other Ambulatory Visit: Payer: Self-pay | Admitting: Family Medicine

## 2013-08-31 ENCOUNTER — Encounter: Payer: Self-pay | Admitting: Family Medicine

## 2013-08-31 ENCOUNTER — Ambulatory Visit (INDEPENDENT_AMBULATORY_CARE_PROVIDER_SITE_OTHER): Payer: PRIVATE HEALTH INSURANCE | Admitting: Family Medicine

## 2013-08-31 VITALS — BP 130/82 | HR 71 | Temp 98.5°F | Resp 16 | Wt 141.4 lb

## 2013-08-31 DIAGNOSIS — J320 Chronic maxillary sinusitis: Secondary | ICD-10-CM

## 2013-08-31 MED ORDER — PROMETHAZINE-DM 6.25-15 MG/5ML PO SYRP: 5.0000 mL | ORAL_SOLUTION | Freq: Four times a day (QID) | ORAL | Status: DC | PRN

## 2013-08-31 MED ORDER — AMOXICILLIN 875 MG PO TABS: 875.0000 mg | ORAL_TABLET | Freq: Two times a day (BID) | ORAL | Status: DC

## 2013-08-31 NOTE — Progress Notes (Signed)
Pre visit review using our clinic review tool, if applicable. No additional management support is needed unless otherwise documented below in the visit note. 

## 2013-08-31 NOTE — Assessment & Plan Note (Signed)
New.  Pt's sxs and PE consistent w/ infxn.  Start abx.  Cough meds prn.  Reviewed supportive care and red flags that should prompt return.  Pt expressed understanding and is in agreement w/ plan.

## 2013-08-31 NOTE — Patient Instructions (Signed)
Follow up as needed Start the Amoxcillin twice daily Use the cough syrup as needed- will cause drowsiness Add mucinex to thin your chest congestion Drink plenty of fluids REST! Call with any questions or concerns Hang in there! Happy New Year!!!

## 2013-08-31 NOTE — Progress Notes (Signed)
   Subjective:    Patient ID: Judy Ramos, female    DOB: [birth year], 53 y.o.   MRN: [id number]  HPI Cough- sxs started 'a couple of weeks ago'.  sxs are worsening rather than improving.  Having maxillary sinus pressure.  Bilateral ear fullness.  Cough- wet, would be productive but pt swallows mucous.  + nasal congestion.  No fevers.  + sick contacts.  No N/V/D.   Review of Systems For ROS see HPI     Objective:   Physical Exam  Vitals reviewed. Constitutional: She appears well-developed and well-nourished. No distress.  HENT:  Head: Normocephalic and atraumatic.  Right Ear: Tympanic membrane normal.  Left Ear: Tympanic membrane normal.  Nose: Mucosal edema and rhinorrhea present. Right sinus exhibits maxillary sinus tenderness. Right sinus exhibits no frontal sinus tenderness. Left sinus exhibits maxillary sinus tenderness. Left sinus exhibits no frontal sinus tenderness.  Mouth/Throat: Uvula is midline and mucous membranes are normal. Posterior oropharyngeal erythema present. No oropharyngeal exudate.  Eyes: Conjunctivae and EOM are normal. Pupils are equal, round, and reactive to light.  Neck: Normal range of motion. Neck supple.  Cardiovascular: Normal rate, regular rhythm and normal heart sounds.   Pulmonary/Chest: Effort normal and breath sounds normal. No respiratory distress. She has no wheezes.  Lymphadenopathy:    She has no cervical adenopathy.          Assessment & Plan:

## 2013-09-04 ENCOUNTER — Telehealth: Payer: Self-pay | Admitting: *Deleted

## 2013-09-04 NOTE — Telephone Encounter (Signed)
Patient was seen in the office on 08/31/2013 for sinusitis. Patient states that she still have a cough. Patient states that she coughs mostly at night and that she coughs so hard that she vomits. Patient would like to know if their is something that she can get to help the cough so she can sleep better at night. Please advise. SW

## 2013-09-04 NOTE — Telephone Encounter (Signed)
cheratussin 1-2 tsp po qhs prn cough  6 oz

## 2013-09-05 MED ORDER — GUAIFENESIN-CODEINE 100-10 MG/5ML PO SYRP: 5.0000 mL | ORAL_SOLUTION | Freq: Every evening | ORAL | Status: DC | PRN

## 2013-09-05 NOTE — Telephone Encounter (Signed)
Med filled and patient notified.

## 2013-09-08 ENCOUNTER — Encounter: Payer: Self-pay | Admitting: Internal Medicine

## 2013-09-08 ENCOUNTER — Ambulatory Visit (INDEPENDENT_AMBULATORY_CARE_PROVIDER_SITE_OTHER): Payer: PRIVATE HEALTH INSURANCE | Admitting: Internal Medicine

## 2013-09-08 VITALS — BP 120/76 | HR 72 | Temp 98.2°F | Resp 20 | Wt 141.0 lb

## 2013-09-08 DIAGNOSIS — R05 Cough: Secondary | ICD-10-CM | POA: Insufficient documentation

## 2013-09-08 DIAGNOSIS — R059 Cough, unspecified: Secondary | ICD-10-CM | POA: Insufficient documentation

## 2013-09-08 DIAGNOSIS — R21 Rash and other nonspecific skin eruption: Secondary | ICD-10-CM

## 2013-09-08 DIAGNOSIS — J069 Acute upper respiratory infection, unspecified: Secondary | ICD-10-CM

## 2013-09-08 MED ORDER — AZITHROMYCIN 250 MG PO TABS: ORAL_TABLET | ORAL | Status: DC

## 2013-09-08 MED ORDER — HYDROCODONE-HOMATROPINE 5-1.5 MG/5ML PO SYRP
5.0000 mL | ORAL_SOLUTION | Freq: Four times a day (QID) | ORAL | Status: DC | PRN
Start: 2013-09-08 — End: 2013-10-03

## 2013-09-08 MED ORDER — METHYLPREDNISOLONE ACETATE 80 MG/ML IJ SUSP
80.0000 mg | Freq: Once | INTRAMUSCULAR | Status: AC
  Administered 2013-09-08: 80 mg via INTRAMUSCULAR

## 2013-09-08 MED ORDER — PREDNISONE 10 MG PO TABS: ORAL_TABLET | ORAL | Status: DC

## 2013-09-08 NOTE — Patient Instructions (Signed)
You had the steroid shot today Please take all new medication as prescribed  - the antibiotic, cough medicine, and prednisone Please continue all other medications as before, and refills have been done if requested. Please have the pharmacy call with any other refills you may need. You should also take benadryl 50 mg every 6 hrs as needed for itch and rash  Please never take a Penicillin related medication in the future, as this is the likely cause for your rash  Please see your PCP in 3-5 days if not improving, as she could consider a CXR if needed

## 2013-09-08 NOTE — Progress Notes (Signed)
Pre-visit discussion using our clinic review tool. No additional management support is needed unless otherwise documented below in the visit note.  

## 2013-09-08 NOTE — Progress Notes (Signed)
Subjective:    Patient ID: Judy Ramos, female    DOB: [birth year], 53 y.o.   MRN: [id number]  HPI  Here with c/o pruritic rash; recent hx involves being out of town for holidays, seen and tx for URI like symptoms with amoxil, has some cough and diffuse CP residual and taking robitussin AC as well, not with quite large diffuse itchy rash to torso/buttocks/prox legs with some blanching; but no swelling including lips, tongue.   Also incidentally here with 2-3 days recurrent acute onset fever, facial pain, pressure, headache, general weakness and malaise, and greenish d/c, with mild ST and cough,  and Pt denies chest pain, increased sob or doe, wheezing, orthopnea, PND, increased LE swelling, palpitations, dizziness or syncope.  Pt denies new neurological symptoms such as new headache, or facial or extremity weakness or numbness   Pt denies polydipsia, polyuria. Past Medical History  Diagnosis Date  . Gallstone   . Hypothyroidism   . Thyroid nodule    Past Surgical History  Procedure Laterality Date  . Cesarean section      x 2  . Lasik    . Cholecystectomy  05/1999  . Breast enhancement surgery  2001  . Uterine ablation  2011  . Dilation and curettage of uterus  2011  . Vein ligation and stripping      reports that she has never smoked. She does not have any smokeless tobacco history on file. She reports that she drinks about 8.4 ounces of alcohol per week. She reports that she does not use illicit drugs. family history includes Arthritis in her father and mother; Cancer in her paternal grandfather; Hypertension in her father and mother; Lymphoma in her paternal uncle; Neuropathy in an other family member; Stroke in her paternal grandmother. Allergies  Allergen Reactions  . Amoxil [Amoxicillin]    Current Outpatient Prescriptions on File Prior to Visit  Medication Sig Dispense Refill  . amoxicillin (AMOXIL) 875 MG tablet Take 1 tablet (875 mg total) by mouth 2 (two) times daily.  20  tablet  0  . Calcium Carbonate (CALCARB 600) 1500 MG TABS Take by mouth.        . cholecalciferol (VITAMIN D) 1000 UNITS tablet Take 1,000 Units by mouth daily.        Marland Kitchen guaiFENesin-codeine (ROBITUSSIN AC) 100-10 MG/5ML syrup Take 5-10 mLs by mouth at bedtime as needed for cough.  180 mL  0  . levothyroxine (SYNTHROID, LEVOTHROID) 50 MCG tablet TAKE ONE TABLET BY MOUTH ONCE DAILY  30 tablet  5  . Norethin-Eth Estradiol-Fe (GENERESS FE PO) Take by mouth daily.        . pravastatin (PRAVACHOL) 40 MG tablet 1 tab by mouth daily--labs are due now  30 tablet  0   No current facility-administered medications on file prior to visit.   Review of Systems  Constitutional: Negative for unexpected weight change, or unusual diaphoresis  HENT: Negative for tinnitus.   Eyes: Negative for photophobia and visual disturbance.  Respiratory: Negative for choking and stridor.   Gastrointestinal: Negative for vomiting and blood in stool.  Genitourinary: Negative for hematuria and decreased urine volume.  Musculoskeletal: Negative for acute joint swelling Skin: Negative for color change and wound.  Neurological: Negative for tremors and numbness other than noted  Psychiatric/Behavioral: Negative for decreased concentration or  hyperactivity.       Objective:   Physical Exam BP 120/76  Pulse 72  Temp(Src) 98.2 F (36.8 C) (Oral)  Resp 20  Wt  141 lb 0.6 oz (63.975 kg)  SpO2 98%  LMP 08/28/2013 VS noted, not ill appaearing, but fatigued Constitutional: Pt appears well-developed and well-nourished.  HENT: Head: NCAT.  Right Ear: External ear normal.  Left Ear: External ear normal.  Eyes: Conjunctivae and EOM are normal. Pupils are equal, round, and reactive to light.  Neck: Normal range of motion. Neck supple.  Cardiovascular: Normal rate and regular rhythm.   Pulmonary/Chest: Effort normal and breath sounds normal.  - no rales or wheezing Neurological: Pt is alert. Not confused  Skin: Diffuse  papular rash with blanching to torso, buttocks, prox legs, no other angioedema or other swelling Psychiatric: Pt behavior is normal. Thought content normal.     Assessment & Plan:

## 2013-09-09 NOTE — Assessment & Plan Note (Addendum)
Mild to mod, for antibx course,  to f/u any worsening symptoms or concerns 

## 2013-09-09 NOTE — Assessment & Plan Note (Addendum)
Most likely drug allergy due to amoxil, to add amoxil to allergy list, d/w pt, no further pcn like meds in the futurem, fortunately no angioedema, for benadryl 50 q 6 hrs prn, for depomedrol IM, and predpack asd

## 2013-09-09 NOTE — Assessment & Plan Note (Signed)
Ok to cont cough med, quite unlikely to be related to rash

## 2013-09-10 ENCOUNTER — Telehealth: Payer: Self-pay | Admitting: *Deleted

## 2013-09-10 NOTE — Telephone Encounter (Signed)
[facility] Triage Call Report Triage Record Num: [id number] Operator: Soledad Gerlach Patient Name: Judy Ramos Call Date & Time: 09/07/2013 8:56:08PM Patient Phone: [phone] PCP: Rosalita Chessman Patient Gender: Female PCP Fax : [phone] Patient DOB: 28-Apr-1961 [facility] Name[facility] Reason for Call: Caller: [name]/Patient; PCP: Rosalita Chessman.; CB#: [phone]; Call regarding Hives on cherritussin cough syrup. Took second dose of the syrup 2015 09/07/13 and noted hives within 30 minutes of taking it. This was the only new medication she has had recently. Seen in office 09/06/13 and given this Rx. Per hives protocol, advised to stop the new medication; to take 50mg  benadryl now; offered appt in AM 09/08/13. Patient states will call in AM if hives still present. Callback parameters given. Protocol(s) Used: Hives Recommended Outcome per Protocol: Call Provider within 4 Hours Reason for Outcome: New onset of hives after beginning new prescribed, nonprescribed, or alternative/complementary medication Care Advice: Cool/tepid showers or baths may help relieve itching. If cool water alone does not relieve itching, try adding 1/2 to 1 cup baking soda or colloidal oatmeal (Aveeno) to bath water. ~ ~ Speak with provider before next dose of medication is due. ~ Should not be alone for the next 24 hours in case symptoms worsen. ~ IMMEDIATE

## 2013-09-14 ENCOUNTER — Telehealth: Payer: Self-pay | Admitting: *Deleted

## 2013-09-14 DIAGNOSIS — E039 Hypothyroidism, unspecified: Secondary | ICD-10-CM

## 2013-09-14 MED ORDER — PRAVASTATIN SODIUM 40 MG PO TABS: 40.0000 mg | ORAL_TABLET | Freq: Every day | ORAL | Status: DC

## 2013-09-14 MED ORDER — LEVOTHYROXINE SODIUM 50 MCG PO TABS
ORAL_TABLET | ORAL | Status: DC
Start: 2013-09-14 — End: 2013-10-03

## 2013-09-14 NOTE — Telephone Encounter (Signed)
Patient called and stated that she needs a refill on her synthroid and pravachol. Patient states that she has switched insurance and now need her prescriptions to go to the [facility] on [facility]. Patient is due for an office visit and lab work. Patient was scheduled and med was sent to the pharmacy.

## 2013-09-27 ENCOUNTER — Ambulatory Visit: Payer: PRIVATE HEALTH INSURANCE | Admitting: Family Medicine

## 2013-10-03 ENCOUNTER — Ambulatory Visit (INDEPENDENT_AMBULATORY_CARE_PROVIDER_SITE_OTHER): Payer: PRIVATE HEALTH INSURANCE | Admitting: Family Medicine

## 2013-10-03 ENCOUNTER — Encounter: Payer: Self-pay | Admitting: Family Medicine

## 2013-10-03 VITALS — BP 118/72 | HR 81 | Temp 97.8°F | Wt 136.0 lb

## 2013-10-03 DIAGNOSIS — E039 Hypothyroidism, unspecified: Secondary | ICD-10-CM

## 2013-10-03 DIAGNOSIS — E785 Hyperlipidemia, unspecified: Secondary | ICD-10-CM

## 2013-10-03 LAB — CBC WITH DIFFERENTIAL/PLATELET
Basophils Absolute: 0 10*3/uL (ref 0.0–0.1)
Basophils Relative: 0.6 % (ref 0.0–3.0)
EOS ABS: 0.2 10*3/uL (ref 0.0–0.7)
Eosinophils Relative: 3.7 % (ref 0.0–5.0)
HEMATOCRIT: 41.4 % (ref 36.0–46.0)
HEMOGLOBIN: 13.9 g/dL (ref 12.0–15.0)
LYMPHS ABS: 2.1 10*3/uL (ref 0.7–4.0)
Lymphocytes Relative: 32.5 % (ref 12.0–46.0)
MCHC: 33.6 g/dL (ref 30.0–36.0)
MCV: 99.6 fl (ref 78.0–100.0)
Monocytes Absolute: 0.4 10*3/uL (ref 0.1–1.0)
Monocytes Relative: 6.2 % (ref 3.0–12.0)
NEUTROS ABS: 3.7 10*3/uL (ref 1.4–7.7)
Neutrophils Relative %: 57 % (ref 43.0–77.0)
Platelets: 257 10*3/uL (ref 150.0–400.0)
RBC: 4.16 Mil/uL (ref 3.87–5.11)
RDW: 13.5 % (ref 11.5–14.6)
WBC: 6.5 10*3/uL (ref 4.5–10.5)

## 2013-10-03 LAB — LIPID PANEL
Cholesterol: 187 mg/dL (ref 0–200)
HDL: 63.2 mg/dL (ref >39.00)
LDL CALC: 100 mg/dL — AB (ref 0–99)
TRIGLYCERIDES: 117 mg/dL (ref 0.0–149.0)
Total CHOL/HDL Ratio: 3
VLDL: 23.4 mg/dL (ref 0.0–40.0)

## 2013-10-03 LAB — HEPATIC FUNCTION PANEL
ALBUMIN: 4.1 g/dL (ref 3.5–5.2)
ALT: 19 U/L (ref 0–35)
AST: 21 U/L (ref 0–37)
Alkaline Phosphatase: 41 U/L (ref 39–117)
BILIRUBIN TOTAL: 0.9 mg/dL (ref 0.3–1.2)
Bilirubin, Direct: 0 mg/dL (ref 0.0–0.3)
Total Protein: 7.5 g/dL (ref 6.0–8.3)

## 2013-10-03 LAB — TSH: TSH: 0.58 u[IU]/mL (ref 0.35–5.50)

## 2013-10-03 LAB — BASIC METABOLIC PANEL
BUN: 14 mg/dL (ref 6–23)
CO2: 26 mEq/L (ref 19–32)
Calcium: 9.2 mg/dL (ref 8.4–10.5)
Chloride: 102 mEq/L (ref 96–112)
Creatinine, Ser: 0.9 mg/dL (ref 0.4–1.2)
GFR: 70.35 mL/min (ref >60.00)
Glucose, Bld: 90 mg/dL (ref 70–99)
Potassium: 3.6 mEq/L (ref 3.5–5.1)
Sodium: 136 mEq/L (ref 135–145)

## 2013-10-03 LAB — T4, FREE: Free T4: 1.09 ng/dL (ref 0.60–1.60)

## 2013-10-03 LAB — T3, FREE: T3 FREE: 2.9 pg/mL (ref 2.3–4.2)

## 2013-10-03 MED ORDER — LEVOTHYROXINE SODIUM 50 MCG PO TABS: ORAL_TABLET | ORAL | Status: DC

## 2013-10-03 MED ORDER — PRAVASTATIN SODIUM 40 MG PO TABS: 40.0000 mg | ORAL_TABLET | Freq: Every day | ORAL | Status: DC

## 2013-10-03 NOTE — Progress Notes (Signed)
Pre visit review using our clinic review tool, if applicable. No additional management support is needed unless otherwise documented below in the visit note. 

## 2013-10-03 NOTE — Patient Instructions (Signed)

## 2013-10-03 NOTE — Progress Notes (Signed)
  Subjective:    Judy Ramos is a 53 y.o. female here for follow up of dyslipidemia. The patient does not use medications that may worsen dyslipidemias (corticosteroids, progestins, anabolic steroids, diuretics, beta-blockers, amiodarone, cyclosporine, olanzapine). The patient exercises frequently. The patient is not known to have coexisting coronary artery disease.   Cardiac Risk Factors Age > 45-female, > 55-female:  NO  Smoking:   NO  Sig. family hx of CHD*:  NO  Hypertension:   NO  Diabetes:   NO  HDL < 35:   NO  HDL > 58:   YES  -1  Total: 0   *- Sig. family h/o CHD per NCEP = MI or sudden death at <55yo in  father or other 71st-degree female relative, or <65yo in mother or  other 1st-degree female relative  The following portions of the patient's history were reviewed and updated as appropriate: allergies, current medications, past family history, past medical history, past social history, past surgical history and problem list.  Review of Systems Pertinent items are noted in HPI.    Objective:    BP 118/72  Pulse 81  Temp(Src) 97.8 F (36.6 C) (Oral)  Wt 136 lb (61.689 kg)  SpO2 99%  LMP 08/28/2013 General appearance: alert, cooperative and no distress Neck: no adenopathy, no carotid bruit, no JVD, supple, symmetrical, trachea midline and thyroid not enlarged, symmetric, no tenderness/mass/nodules Lungs: clear to auscultation bilaterally Heart: S1, S2 normal Extremities: extremities normal, atraumatic, no cyanosis or edema  Lab Review Lab Results  Component Value Date   CHOL 168 11/24/2012   CHOL 198 05/17/2011   CHOL 179 07/13/2010   HDL 65.20 11/24/2012   HDL 67.00 05/17/2011   HDL 64.40 07/13/2010   LDLDIRECT 162.6 09/30/2006      Assessment:    Dyslipidemia as detailed above with 0 CHD risk factors using NCEP scheme above.   Explained to the patient the respective contributions of genetics, diet, and exercise to lipid levels and the use of medication in  severe cases which do not respond to lifestyle alteration. The patient's interest and motivation in making lifestyle changes seems good.    Plan:    The following changes are planned for the next 6 months, at which time the patient will return for repeat fasting lipids:  1. Dietary changes: Increase soluble fiber Reduce saturated fat, "trans" monounsaturated fatty acids, and cholesterol 2. Exercise changes:  cont exercise 3. Other treatment: None 4. Lipid-lowering medications: statin   (Recommended by NCEP after 3-6 mos of dietary therapy & lifestyle modification,  except if CHD is present or LDL well above 190.) 5. Hormone replacement therapy (patient is a postmenopausal  woman): no 6. Screening for secondary causes of dyslipidemias: None indicated 7. Lipid screening for relatives: na 8. Follow up: 6 months.  Note: The majority of the visit was spent in counseling on the pathophysiology and treatment of dyslipidemias. The total face-to-face time was in excess of 20 minutes.

## 2013-10-10 ENCOUNTER — Telehealth: Payer: Self-pay | Admitting: *Deleted

## 2013-10-10 ENCOUNTER — Other Ambulatory Visit: Payer: Self-pay | Admitting: Family Medicine

## 2013-10-10 DIAGNOSIS — R059 Cough, unspecified: Secondary | ICD-10-CM

## 2013-10-10 DIAGNOSIS — R05 Cough: Secondary | ICD-10-CM

## 2013-10-10 MED ORDER — HYDROCODONE-HOMATROPINE 5-1.5 MG/5ML PO SYRP: ORAL_SOLUTION | ORAL | Status: DC

## 2013-10-10 NOTE — Telephone Encounter (Signed)
Patient called and stated that she is having symptoms of a sinus infection (cough and drainage). She would like to know if Hydromet can be prescribed for her because she has had success with it previously. She states that she does not experience allergic symptoms while taking it. Please advise. JG//CMA

## 2013-10-10 NOTE — Telephone Encounter (Signed)
LMOVM of cell phone (identifiable) that Rx for Hycodan syrup is up front. Also advised that if she is no better in a few days se would need an office visit.

## 2013-10-10 NOTE — Telephone Encounter (Signed)
Hycodan is what I see on her list-- i printed that already If it does not work and she is not feeling better in a few days --- ov

## 2013-10-22 ENCOUNTER — Ambulatory Visit (INDEPENDENT_AMBULATORY_CARE_PROVIDER_SITE_OTHER): Payer: PRIVATE HEALTH INSURANCE | Admitting: Physician Assistant

## 2013-10-22 ENCOUNTER — Encounter: Payer: Self-pay | Admitting: Physician Assistant

## 2013-10-22 VITALS — BP 104/73 | HR 62 | Temp 98.5°F | Wt 136.0 lb

## 2013-10-22 DIAGNOSIS — J0100 Acute maxillary sinusitis, unspecified: Secondary | ICD-10-CM | POA: Insufficient documentation

## 2013-10-22 DIAGNOSIS — J019 Acute sinusitis, unspecified: Secondary | ICD-10-CM

## 2013-10-22 DIAGNOSIS — R062 Wheezing: Secondary | ICD-10-CM

## 2013-10-22 MED ORDER — ALBUTEROL SULFATE HFA 108 (90 BASE) MCG/ACT IN AERS: 2.0000 | INHALATION_SPRAY | Freq: Four times a day (QID) | RESPIRATORY_TRACT | Status: DC | PRN

## 2013-10-22 MED ORDER — LEVOFLOXACIN 500 MG PO TABS: 500.0000 mg | ORAL_TABLET | Freq: Every day | ORAL | Status: DC

## 2013-10-22 NOTE — Patient Instructions (Signed)
Increase fluid intake.  Rest.  Use saline nasal spray.  Take Mucinex-D for chest congestion.  Place a humidifier in the bedroom.  Please take antibiotic as prescribed.  Use albuterol inhaler every 4-6 hours if needed for wheeze.  Call or return to clinic if symptoms are not improving.  Metered Dose Inhaler (No Spacer Used) Inhaled medicines are the basis of asthma treatment and other breathing problems. Inhaled medicine can only be effective if used properly. Good technique assures that the medicine reaches the lungs. Metered dose inhalers (MDIs) are used to deliver a variety of inhaled medicines. These include quick relief or rescue medicines (such as bronchodilators) and controller medicines (such as corticosteroids). The medicine is delivered by pushing down on a metal canister to release a set amount of spray. If you are using different kinds of inhalers, use your quick relief medicine to open the airways 10 15 minutes before using a steroid if instructed to do so by your health care provider. If you are unsure which inhalers to use and the order of using them, ask your health care provider, nurse, or respiratory therapist. HOW TO USE THE INHALER 1. Remove cap from inhaler. 2. If you are using the inhaler for the first time, you will need to prime it. Shake the inhaler for 5 seconds and release four puffs into the air, away from your face. Ask your health care provider or pharmacist if you have questions about priming your inhaler. 3. Shake inhaler for 5 seconds before each breath in (inhalation). 4. Position the inhaler so that the top of the canister faces up. 5. Put your index finger on the top of the medicine canister. Your thumb supports the bottom of the inhaler. 6. Open your mouth. 7. Either place the inhaler between your teeth and place your lips tightly around the mouthpiece, or hold the inhaler 1 2 inches away from your open mouth. If you are unsure of which technique to use, ask your  health care provider. 8. Breathe out (exhale) normally and as completely as possible. 9. Press the canister down with the index finger to release the medicine. 10. At the same time as the canister is pressed, inhale deeply and slowly until the lungs are completely filled. This should take 4 6 seconds. Keep your tongue down. 11. Hold the medicine in your lungs for up to 5 10 seconds (10 seconds is best). This helps the medicine get into the small airways of your lungs. 12. Breathe out slowly, through pursed lips. Whistling is an example of pursed lips. 13. Wait at least 1 minute between puffs. Continue with the above steps until you have taken the number of puffs your health care provider has ordered. Do not use the inhaler more than your health care provider directs you to. 14. Replace cap on inhaler. 15. Follow the directions from your health care provider or the inhaler insert for cleaning the inhaler. If you are using a steroid inhaler, rinse your mouth with water after your last puff, gargle, and spit out the water. Do not swallow the water. AVOID:  Inhaling before or after starting the spray of medicine. It takes practice to coordinate your breathing with triggering the spray.  Inhaling through the nose (rather than the mouth) when triggering the spray. HOW TO DETERMINE IF YOUR INHALER IS FULL OR NEARLY EMPTY You cannot know when an inhaler is empty by shaking it. A few inhalers are now being made with dose counters. Ask your health care provider  for a prescription that has a dose counter if you feel you need that extra help. If your inhaler does not have a counter, ask your health care provider to help you determine the date you need to refill your inhaler. Write the refill date on a calendar or your inhaler canister. Refill your inhaler 7 10 days before it runs out. Be sure to keep an adequate supply of medicine. This includes making sure it is not expired, and you have a spare inhaler.  SEEK  MEDICAL CARE IF:   Symptoms are only partially relieved with your inhaler.  You are having trouble using your inhaler.  You experience some increase in phlegm. SEEK IMMEDIATE MEDICAL CARE IF:   You feel little or no relief with your inhalers. You are still wheezing and are feeling shortness of breath or tightness in your chest or both.  You have dizziness, headaches, or fast heart rate.  You have chills, fever, or night sweats.  There is a noticeable increase in phlegm production, or there is blood in the phlegm. Document Released: 06/13/2007 Document Revised: 04/18/2013 Document Reviewed: 02/01/2013 [facility] Patient Information 2014 [place], [place].

## 2013-10-22 NOTE — Assessment & Plan Note (Signed)
Treated with Z-pack within 60 days.  Patient penicillin-allergic.  Will Rx Levaquin.  Rx Albuterol inhaler for wheeze.  Increase fluid intake.  Rest.  Saline nasal spray.  Continue hycodan for cough.  Place a humidifier in the bedroom.  Call or return to clinic if symptoms are not improving.

## 2013-10-22 NOTE — Progress Notes (Signed)
Patient presents to clinic today c/o 2 weeks of sinus pressure, sinus pain, post-nasal drip and cough.  Patient states cough has been productive of sputum that is occasionally yellow, but mostly clear.  Does endorse mild wheezing occasionally.  Denies fever, chills or aches.  Does endorse some chest wall tenderness 2/2 persistent coughing.  Patient denies history of significant environmental allergies or reactive airway disease.  Patient denies recent travel.  Endorses her son was recently diagnosed with sinusitis and bilateral AOM.  Patient was seen ~ 1 month ago for sinusitis and given a Z-pack.  States she feels she never completely recovered with that round of antibiotics.  Past Medical History  Diagnosis Date  . Gallstone   . Hypothyroidism   . Thyroid nodule     Current Outpatient Prescriptions on File Prior to Visit  Medication Sig Dispense Refill  . Calcium Carbonate (CALCARB 600) 1500 MG TABS Take by mouth.        . cholecalciferol (VITAMIN D) 1000 UNITS tablet Take 1,000 Units by mouth daily.        Marland Kitchen HYDROcodone-homatropine (HYCODAN) 5-1.5 MG/5ML syrup 1 tsp po qhs prn  120 mL  0  . levothyroxine (SYNTHROID, LEVOTHROID) 50 MCG tablet TAKE ONE TABLET BY MOUTH ONCE DAILY  90 tablet  3  . Norethin-Eth Estrad-Fe Biphas (LO LOESTRIN FE PO) Take 1 tablet by mouth daily.      . pravastatin (PRAVACHOL) 40 MG tablet Take 1 tablet (40 mg total) by mouth daily.  90 tablet  3   No current facility-administered medications on file prior to visit.    Allergies  Allergen Reactions  . Guaifenesin-Codeine Swelling and Rash  . Amoxil [Amoxicillin]     Family History  Problem Relation Age of Onset  . Neuropathy    . Lymphoma Paternal Uncle   . Cancer Paternal Grandfather   . Hypertension Mother   . Arthritis Mother   . Hypertension Father   . Arthritis Father   . Stroke Paternal Grandmother     History   Social History  . Marital Status: Married    Spouse Name: N/A    Number of  Children: N/A  . Years of Education: N/A   Occupational History  .      p/t with husband   Social History Main Topics  . Smoking status: Never Smoker   . Smokeless tobacco: None  . Alcohol Use: 8.4 oz/week    14 Glasses of wine per week  . Drug Use: No  . Sexual Activity: Yes    Partners: Male   Other Topics Concern  . None   Social History Narrative   Regular exercise- yes    Review of Systems - See HPI.  All other ROS are negative.  BP 104/73  Pulse 62  Temp(Src) 98.5 F (36.9 C) (Oral)  Wt 136 lb (61.689 kg)  SpO2 93%  Physical Exam  Vitals reviewed. Constitutional: She is oriented to person, place, and time and well-developed, well-nourished, and in no distress.  HENT:  Head: Normocephalic and atraumatic.  Right Ear: External ear normal.  Left Ear: External ear normal.  Nose: Nose normal.  Mouth/Throat: Oropharynx is clear and moist. No oropharyngeal exudate.  Eyes: Conjunctivae are normal. Pupils are equal, round, and reactive to light.  Neck: Neck supple.  Cardiovascular: Normal rate, regular rhythm, normal heart sounds and intact distal pulses.   No murmur heard. Pulmonary/Chest: Effort normal and breath sounds normal. No respiratory distress. She has no wheezes. She  has no rales. She exhibits no tenderness.  Neurological: She is alert and oriented to person, place, and time. No cranial nerve deficit.  Skin: Skin is warm and dry. No rash noted.  Psychiatric: Affect normal.    Recent Results (from the past 2160 hour(s))  BASIC METABOLIC PANEL     Status: None   Collection Time    10/03/13  9:10 AM      Result Value Ref Range   Sodium 136  135 - 145 mEq/L   Potassium 3.6  3.5 - 5.1 mEq/L   Chloride 102  96 - 112 mEq/L   CO2 26  19 - 32 mEq/L   Glucose, Bld 90  70 - 99 mg/dL   BUN 14  6 - 23 mg/dL   Creatinine, Ser 0.9  0.4 - 1.2 mg/dL   Calcium 9.2  8.4 - 96.010.5 mg/dL   GFR 45.4070.35  >98.11>60.00 mL/min  HEPATIC FUNCTION PANEL     Status: None   Collection  Time    10/03/13  9:10 AM      Result Value Ref Range   Total Bilirubin 0.9  0.3 - 1.2 mg/dL   Bilirubin, Direct 0.0  0.0 - 0.3 mg/dL   Alkaline Phosphatase 41  39 - 117 U/L   AST 21  0 - 37 U/L   ALT 19  0 - 35 U/L   Total Protein 7.5  6.0 - 8.3 g/dL   Albumin 4.1  3.5 - 5.2 g/dL  LIPID PANEL     Status: Abnormal   Collection Time    10/03/13  9:10 AM      Result Value Ref Range   Cholesterol 187  0 - 200 mg/dL   Comment: ATP III Classification       Desirable:  < 200 mg/dL               Borderline High:  200 - 239 mg/dL          High:  > = 914240 mg/dL   Triglycerides 782.9117.0  0.0 - 149.0 mg/dL   Comment: Normal:  <562<150 mg/dLBorderline High:  150 - 199 mg/dL   HDL 13.0863.20  >65.78>39.00 mg/dL   VLDL 46.923.4  0.0 - 62.940.0 mg/dL   LDL Cholesterol 528100 (*) 0 - 99 mg/dL   Total CHOL/HDL Ratio 3     Comment:                Men          Women1/2 Average Risk     3.4          3.3Average Risk          5.0          4.42X Average Risk          9.6          7.13X Average Risk          15.0          11.0                      TSH     Status: None   Collection Time    10/03/13  9:10 AM      Result Value Ref Range   TSH 0.58  0.35 - 5.50 uIU/mL  T3, FREE     Status: None   Collection Time    10/03/13  9:10 AM      Result Value Ref Range  T3, Free 2.9  2.3 - 4.2 pg/mL  T4, FREE     Status: None   Collection Time    10/03/13  9:10 AM      Result Value Ref Range   Free T4 1.09  0.60 - 1.60 ng/dL  CBC WITH DIFFERENTIAL     Status: None   Collection Time    10/03/13  9:10 AM      Result Value Ref Range   WBC 6.5  4.5 - 10.5 K/uL   RBC 4.16  3.87 - 5.11 Mil/uL   Hemoglobin 13.9  12.0 - 15.0 g/dL   HCT 41.4  36.0 - 46.0 %   MCV 99.6  78.0 - 100.0 fl   MCHC 33.6  30.0 - 36.0 g/dL   RDW 13.5  11.5 - 14.6 %   Platelets 257.0  150.0 - 400.0 K/uL   Neutrophils Relative % 57.0  43.0 - 77.0 %   Lymphocytes Relative 32.5  12.0 - 46.0 %   Monocytes Relative 6.2  3.0 - 12.0 %   Eosinophils Relative 3.7  0.0 - 5.0  %   Basophils Relative 0.6  0.0 - 3.0 %   Neutro Abs 3.7  1.4 - 7.7 K/uL   Lymphs Abs 2.1  0.7 - 4.0 K/uL   Monocytes Absolute 0.4  0.1 - 1.0 K/uL   Eosinophils Absolute 0.2  0.0 - 0.7 K/uL   Basophils Absolute 0.0  0.0 - 0.1 K/uL   Assessment/Plan: Acute sinusitis treated with antibiotics in the past 60 days Treated with Z-pack within 60 days.  Patient penicillin-allergic.  Will Rx Levaquin.  Rx Albuterol inhaler for wheeze.  Increase fluid intake.  Rest.  Saline nasal spray.  Continue hycodan for cough.  Place a humidifier in the bedroom.  Call or return to clinic if symptoms are not improving.

## 2013-10-26 ENCOUNTER — Ambulatory Visit (INDEPENDENT_AMBULATORY_CARE_PROVIDER_SITE_OTHER): Payer: PRIVATE HEALTH INSURANCE | Admitting: Family Medicine

## 2013-10-26 ENCOUNTER — Ambulatory Visit (HOSPITAL_BASED_OUTPATIENT_CLINIC_OR_DEPARTMENT_OTHER)
Admission: RE | Admit: 2013-10-26 | Discharge: 2013-10-26 | Disposition: A | Discharge status: Home / Self Care | Payer: 59 | Source: Ambulatory Visit | Attending: Family Medicine | Admitting: Family Medicine

## 2013-10-26 ENCOUNTER — Telehealth: Payer: Self-pay | Admitting: Family Medicine

## 2013-10-26 ENCOUNTER — Encounter: Payer: Self-pay | Admitting: Family Medicine

## 2013-10-26 VITALS — BP 110/68 | HR 81 | Temp 97.4°F | Wt 135.0 lb

## 2013-10-26 DIAGNOSIS — J209 Acute bronchitis, unspecified: Secondary | ICD-10-CM

## 2013-10-26 DIAGNOSIS — R05 Cough: Secondary | ICD-10-CM | POA: Insufficient documentation

## 2013-10-26 DIAGNOSIS — R059 Cough, unspecified: Secondary | ICD-10-CM

## 2013-10-26 DIAGNOSIS — R0989 Other specified symptoms and signs involving the circulatory and respiratory systems: Secondary | ICD-10-CM | POA: Insufficient documentation

## 2013-10-26 DIAGNOSIS — R509 Fever, unspecified: Secondary | ICD-10-CM | POA: Insufficient documentation

## 2013-10-26 MED ORDER — LEVOFLOXACIN 500 MG PO TABS: 500.0000 mg | ORAL_TABLET | Freq: Every day | ORAL | Status: DC

## 2013-10-26 MED ORDER — METHYLPREDNISOLONE ACETATE 80 MG/ML IJ SUSP
80.0000 mg | Freq: Once | INTRAMUSCULAR | Status: AC
  Administered 2013-10-26: 80 mg via INTRAMUSCULAR

## 2013-10-26 MED ORDER — IPRATROPIUM-ALBUTEROL 0.5-2.5 (3) MG/3ML IN SOLN
3.0000 mL | Freq: Once | RESPIRATORY_TRACT | Status: AC
  Administered 2013-10-26: 3 mL via RESPIRATORY_TRACT

## 2013-10-26 MED ORDER — HYDROCODONE-HOMATROPINE 5-1.5 MG/5ML PO SYRP: ORAL_SOLUTION | ORAL | Status: DC

## 2013-10-26 MED ORDER — PREDNISONE 10 MG PO TABS: ORAL_TABLET | ORAL | Status: DC

## 2013-10-26 NOTE — Progress Notes (Signed)
  Subjective:     Judy Ramos is a 53 y.o. female who presents for evaluation of symptoms of a URI. Symptoms include achiness, congestion, nasal congestion, productive cough with  yellow colored sputum, shortness of breath and wheezing. Onset of symptoms was a few weeks ago, and has been gradually worsening since that time. Treatment to date: antibiotics and cough suppressants.  The following portions of the patient's history were reviewed and updated as appropriate: allergies, current medications, past family history, past medical history, past social history, past surgical history and problem list.  Review of Systems Pertinent items are noted in HPI.   Objective:    BP 110/68  Pulse 81  Temp(Src) 97.4 F (36.3 C) (Oral)  Wt 135 lb (61.236 kg)  SpO2 94% General appearance: alert, cooperative, appears stated age and no distress Ears: normal TM's and external ear canals both ears Nose: Nares normal. Septum midline. Mucosa normal. No drainage or sinus tenderness. Throat: lips, mucosa, and tongue normal; teeth and gums normal Neck: no adenopathy, supple, symmetrical, trachea midline and thyroid not enlarged, symmetric, no tenderness/mass/nodules Lungs: diminished breath sounds bilaterally and wheezes bilaterally Heart: S1, S2 normal Extremities: extremities normal, atraumatic, no cyanosis or edema   Assessment:    bronchitis   Plan:    Discussed diagnosis and treatment of URI. Suggested symptomatic OTC remedies. Nasal saline spray for congestion. Follow up as needed. abx per orders

## 2013-10-26 NOTE — Telephone Encounter (Signed)
No pneumonia

## 2013-10-26 NOTE — Progress Notes (Signed)
Pre visit review using our clinic review tool, if applicable. No additional management support is needed unless otherwise documented below in the visit note. 

## 2013-10-26 NOTE — Telephone Encounter (Signed)
Patient left msg on triage line requesting her xray results from earlier today. CB# [phone]

## 2013-10-26 NOTE — Telephone Encounter (Signed)
Patient aware and voiced understanding.      KP 

## 2013-10-26 NOTE — Patient Instructions (Signed)

## 2013-11-09 ENCOUNTER — Ambulatory Visit (INDEPENDENT_AMBULATORY_CARE_PROVIDER_SITE_OTHER): Payer: PRIVATE HEALTH INSURANCE | Admitting: Family Medicine

## 2013-11-09 ENCOUNTER — Encounter: Payer: Self-pay | Admitting: Family Medicine

## 2013-11-09 VITALS — BP 100/58 | HR 68 | Temp 97.9°F | Wt 133.0 lb

## 2013-11-09 DIAGNOSIS — R059 Cough, unspecified: Secondary | ICD-10-CM

## 2013-11-09 DIAGNOSIS — R05 Cough: Secondary | ICD-10-CM

## 2013-11-09 DIAGNOSIS — R053 Chronic cough: Secondary | ICD-10-CM

## 2013-11-09 DIAGNOSIS — J45909 Unspecified asthma, uncomplicated: Secondary | ICD-10-CM

## 2013-11-09 MED ORDER — BECLOMETHASONE DIPROPIONATE 40 MCG/ACT IN AERS: 2.0000 | INHALATION_SPRAY | Freq: Two times a day (BID) | RESPIRATORY_TRACT | Status: DC

## 2013-11-09 NOTE — Patient Instructions (Signed)
Asthma, Acute Bronchospasm °Acute bronchospasm caused by asthma is also referred to as an asthma attack. Bronchospasm means your air passages become narrowed. The narrowing is caused by inflammation and tightening of the muscles in the air tubes (bronchi) in your lungs. This can make it hard to breath or cause you to wheeze and cough. °CAUSES °Possible triggers are: °· Animal dander from the skin, hair, or feathers of animals. °· Dust mites contained in house dust. °· Cockroaches. °· Pollen from trees or grass. °· Mold. °· Cigarette or tobacco smoke. °· Air pollutants such as dust, household cleaners, hair sprays, aerosol sprays, paint fumes, strong chemicals, or strong odors. °· Cold air or weather changes. Cold air may trigger inflammation. Winds increase molds and pollens in the air. °· Strong emotions such as crying or laughing hard. °· Stress. °· Certain medicines such as aspirin or beta-blockers. °· Sulfites in foods and drinks, such as dried fruits and wine. °· Infections or inflammatory conditions, such as a flu, cold, or inflammation of the nasal membranes (rhinitis). °· Gastroesophageal reflux disease (GERD). GERD is a condition where stomach acid backs up into your throat (esophagus). °· Exercise or strenuous activity. °SIGNS AND SYMPTOMS  °· Wheezing. °· Excessive coughing, particularly at night. °· Chest tightness. °· Shortness of breath. °DIAGNOSIS  °Your health care provider will ask you about your medical history and perform a physical exam. A chest X-ray or blood testing may be performed to look for other causes of your symptoms or other conditions that may have triggered your asthma attack.  °TREATMENT  °Treatment is aimed at reducing inflammation and opening up the airways in your lungs.  Most asthma attacks are treated with inhaled medicines. These include quick relief or rescue medicines (such as bronchodilators) and controller medicines (such as inhaled corticosteroids). These medicines are  sometimes given through an inhaler or a nebulizer. Systemic steroid medicine taken by mouth or given through an IV tube also can be used to reduce the inflammation when an attack is moderate or severe. Antibiotic medicines are only used if a bacterial infection is present.  °HOME CARE INSTRUCTIONS  °· Rest. °· Drink plenty of liquids. This helps the mucus to remain thin and be easily coughed up. Only use caffeine in moderation and do not use alcohol until you have recovered from your illness. °· Do not smoke. Avoid being exposed to secondhand smoke. °· You play a critical role in keeping yourself in good health. Avoid exposure to things that cause you to wheeze or to have breathing problems. °· Keep your medicines up to date and available. Carefully follow your health care provider's treatment plan. °· Take your medicine exactly as prescribed. °· When pollen or pollution is bad, keep windows closed and use an air conditioner or go to places with air conditioning. °· Asthma requires careful medical care. See your health care provider for a follow-up as advised. If you are more than [redacted] weeks pregnant and you were prescribed any new medicines, let your obstetrician know about the visit and how you are doing. Follow-up with your health care provider as directed. °· After you have recovered from your asthma attack, make an appointment with your outpatient doctor to talk about ways to reduce the likelihood of future attacks. If you do not have a doctor who manages your asthma, make an appointment with a primary care doctor to discuss your asthma. °SEEK IMMEDIATE MEDICAL CARE IF:  °· You are getting worse. °· You have trouble breathing. If severe, call   your local emergency services (911 in the [place] °· You develop chest pain or discomfort. °· You are vomiting. °· You are not able to keep fluids down. °· You are coughing up yellow, green, brown, or bloody sputum. °· You have a fever and your symptoms suddenly get  worse. °· You have trouble swallowing. °MAKE SURE YOU:  °· Understand these instructions. °· Will watch your condition. °· Will get help right away if you are not doing well or get worse. °Document Released: 12/01/2006 Document Revised: 04/18/2013 Document Reviewed: 02/21/2013 °ExitCare® Patient Information ©2014 [employer]. ° °

## 2013-11-09 NOTE — Progress Notes (Signed)
Pre visit review using our clinic review tool, if applicable. No additional management support is needed unless otherwise documented below in the visit note. 

## 2013-11-09 NOTE — Progress Notes (Signed)
Patient ID: Judy Ramos, female   DOB: 07-23-1961, 53 y.o.   MRN: [id number]   Subjective:    Patient ID: Judy Ramos, female    DOB: 1961/04/22, 53 y.o.   MRN: [id number] HPI Pt here f/u cough.  It did improve esp with steroids but then started back again.  Nonproductive.  No congestion. No fever.         Objective:    BP 100/58  Pulse 68  Temp(Src) 97.9 F (36.6 C) (Oral)  Wt 133 lb (60.328 kg)  SpO2 98% General appearance: alert, cooperative and no distress Ears: normal TM's and external ear canals both ears Nose: Nares normal. Septum midline. Mucosa normal. No drainage or sinus tenderness. Throat: lips, mucosa, and tongue normal; teeth and gums normal Lungs: wheezes bilaterally Heart: regular rate and rhythm, S1, S2 normal, no murmur, click, rub or gallop         Assessment & Plan:  1. Reactive airway disease Refer to pulmonary - beclomethasone (QVAR) 40 MCG/ACT inhaler; Inhale 2 puffs into the lungs 2 (two) times daily.  Dispense: 1 Inhaler; Refill: 12  2. Chronic cough See above - Ambulatory referral to Pulmonology

## 2013-12-10 ENCOUNTER — Institutional Professional Consult (permissible substitution): Payer: PRIVATE HEALTH INSURANCE | Admitting: Pulmonary Disease

## 2014-06-14 ENCOUNTER — Other Ambulatory Visit: Payer: Self-pay

## 2014-07-02 ENCOUNTER — Other Ambulatory Visit: Payer: Self-pay | Admitting: Obstetrics and Gynecology

## 2014-07-03 LAB — CYTOLOGY - PAP

## 2015-11-07 ENCOUNTER — Ambulatory Visit: Payer: Self-pay | Admitting: Allergy and Immunology

## 2015-11-10 ENCOUNTER — Encounter: Payer: Self-pay | Admitting: *Deleted

## 2015-11-10 ENCOUNTER — Ambulatory Visit (INDEPENDENT_AMBULATORY_CARE_PROVIDER_SITE_OTHER): Payer: BLUE CROSS/BLUE SHIELD | Admitting: Allergy and Immunology

## 2015-11-10 ENCOUNTER — Other Ambulatory Visit: Payer: Self-pay | Admitting: Allergy

## 2015-11-10 ENCOUNTER — Encounter: Payer: Self-pay | Admitting: Allergy and Immunology

## 2015-11-10 VITALS — BP 104/70 | HR 82 | Temp 97.7°F | Resp 16 | Ht 65.3 in | Wt 120.6 lb

## 2015-11-10 DIAGNOSIS — K219 Gastro-esophageal reflux disease without esophagitis: Secondary | ICD-10-CM

## 2015-11-10 DIAGNOSIS — J3089 Other allergic rhinitis: Secondary | ICD-10-CM | POA: Insufficient documentation

## 2015-11-10 DIAGNOSIS — Z8709 Personal history of other diseases of the respiratory system: Secondary | ICD-10-CM | POA: Diagnosis not present

## 2015-11-10 DIAGNOSIS — J320 Chronic maxillary sinusitis: Secondary | ICD-10-CM | POA: Diagnosis not present

## 2015-11-10 DIAGNOSIS — R918 Other nonspecific abnormal finding of lung field: Secondary | ICD-10-CM

## 2015-11-10 DIAGNOSIS — R053 Chronic cough: Secondary | ICD-10-CM | POA: Insufficient documentation

## 2015-11-10 DIAGNOSIS — J9819 Other pulmonary collapse: Secondary | ICD-10-CM | POA: Diagnosis not present

## 2015-11-10 DIAGNOSIS — R05 Cough: Secondary | ICD-10-CM | POA: Diagnosis not present

## 2015-11-10 MED ORDER — AZELASTINE-FLUTICASONE 137-50 MCG/ACT NA SUSP: NASAL | Status: DC

## 2015-11-10 MED ORDER — MOMETASONE FURO-FORMOTEROL FUM 200-5 MCG/ACT IN AERO: INHALATION_SPRAY | RESPIRATORY_TRACT | Status: DC

## 2015-11-10 MED ORDER — DEXLANSOPRAZOLE 30 MG PO CPDR: DELAYED_RELEASE_CAPSULE | ORAL | Status: DC

## 2015-11-10 MED ORDER — DEXLANSOPRAZOLE 30 MG PO CPDR: 30.0000 mg | DELAYED_RELEASE_CAPSULE | Freq: Every day | ORAL | Status: DC

## 2015-11-10 NOTE — Patient Instructions (Addendum)
Perennial allergic rhinitis with a nonallergic component  Aeroallergen avoidance measures have been discussed and provided in written form.   A prescription has been provided for Dymista nasal spray, one spray per nostril 2 times daily as needed. Proper nasal spray technique has been discussed and demonstrated.  Nasal saline lavage (NeilMed) as needed has been recommended along with instructions for proper administration.  Guaifenesin 1200 mg (plus/minus pseudoephedrine 120 mg) twice daily as needed with adequate hydration. Pseudoephedrine is only to be used for short-term relief of nasal/sinus congestion. Long-term use is discouraged due to potential side effects.   Sinus CT has been ordered to rule out polyps and other potential structural etiologies.  Chronic maxillary sinusitis  Treatment plan as outlined above for mixed rhinitis.  The following labs have been ordered: CBC with differential, alpha-1 antitrypsin level, P-ANCA, C-ANCA, CRP, ANA, IgG, IgA and IgM as well as tetanus IgG and pneumococcal IgG titers. Post-vaccination titers will be drawn to assess response if IgG and pre-vaccination titers are low.    Janann will be called with further recommendations and follow-up instructions once the labs have returned.  Persistent cough The most common causes of chronic cough include the following: upper airway cough syndrome (UACS) which is caused by variety of rhinosinus conditions; asthma; gastroesophageal reflux disease (GERD); chronic bronchitis from cigarette smoking or other inhaled environmental irritants; non-asthmatic eosinophilic bronchitis; and bronchiectasis. In prospective studies, these conditions have accounted for up to 94% of the causes of chronic cough in immunocompetent adults. The history and physical examination suggest that her cough is multifactorial. We will regroup in 6 weeks to assess treatment response and adjust therapy accordingly.  A prescription has been  provided for [facility] (mometasone/formoterol) 200/5 g, 2 inhalations twice a day.  To maximize pulmonary deposition, a spacer has been provided along with instructions for its proper administration with an HFA inhaler.  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  A prescription has been provided for Dexilant 30 mg daily 30 minutes prior to meal.  Chest x-ray has been ordered.  Esophageal reflux  Continue appropriate lifestyle modifications.  A prescription has been provided for dexlansoprazole (as above).   Return in about 6 weeks (around 12/22/2015), or if symptoms worsen or fail to improve.  Control of House Dust Mite Allergen  House dust mites play a major role in allergic asthma and rhinitis.  They occur in environments with high humidity wherever human skin, the food for dust mites is found. High levels have been detected in dust obtained from mattresses, pillows, carpets, upholstered furniture, bed covers, clothes and soft toys.  The principal allergen of the house dust mite is found in its feces.  A gram of dust may contain 1,000 mites and 250,000 fecal particles.  Mite antigen is easily measured in the air during house cleaning activities.    1. Encase mattresses, including the box spring, and pillow, in an air tight cover.  Seal the zipper end of the encased mattresses with wide adhesive tape. 2. Wash the bedding in water of 130 degrees Farenheit weekly.  Avoid cotton comforters/quilts and flannel bedding: the most ideal bed covering is the dacron comforter. 3. Remove all upholstered furniture from the bedroom. 4. Remove carpets, carpet padding, rugs, and non-washable window drapes from the bedroom.  Wash drapes weekly or use plastic window coverings. 5. Remove all non-washable stuffed toys from the bedroom.  Wash stuffed toys weekly. 6. Have the room cleaned frequently with a vacuum cleaner and a damp dust-mop.  The patient should not be in a room which is being  cleaned and should wait 1 hour after cleaning before going into the room. 7. Close and seal all heating outlets in the bedroom.  Otherwise, the room will become filled with dust-laden air.  An electric heater can be used to heat the room. 8. Reduce indoor humidity to less than 50%.  Do not use a humidifier.

## 2015-11-10 NOTE — Progress Notes (Signed)
New Patient Note  RE: Judy Ramos MRN: [id number] DOB: 03-03-1961 Date of Office Visit: 11/10/2015  Referring provider: Rosalita Chessman, DO Primary care provider: Chesley Mires, MD  Chief Complaint: Cough and Sinus Problem   History of present illness: HPI Comments: Judy Ramos is a 55 y.o. female who presents today for consultation of rhinosinusitis and coughing.  She reports that in December 2014 she developed what she believed to be a common cold "which never went away."  She complains of maxillary sinus pressure, bilateral ear pressure, nasal congestion, copious thick postnasal drainage, anosmia, rhinorrhea, sneezing, and fatigue.  These symptoms occur year around without specific environmental triggers identified, however the symptoms do seem to become more severe during times of rapid weather change.  The anosmia is relieved temporarily while on prednisone.  She had sinus surgery in 2016 but is unclear on the specifics of the procedure.  She complains of a persistent cough which is productive of thick mucus. 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 Over the past 2 weeks she has required albuterol rescue more frequently due to coughing and wheezing without a specific trigger.  She has been on multiple rounds of antibiotics and systemic steroids for upper and lower respiratory symptoms with some symptom relief, however the symptoms rapidly return. She experiences frequent heartburn and eructation.    Assessment and plan:  Perennial allergic rhinitis with a nonallergic component  Aeroallergen avoidance measures have been discussed and provided in written form.   A prescription has been provided for Dymista nasal spray, one spray per nostril 2 times daily as needed. Proper nasal spray technique has been discussed and demonstrated.  Nasal  saline lavage (NeilMed) as needed has been recommended along with instructions for proper administration.  Guaifenesin 1200 mg (plus/minus pseudoephedrine 120 mg) twice daily as needed with adequate hydration. Pseudoephedrine is only to be used for short-term relief of nasal/sinus congestion. Long-term use is discouraged due to potential side effects.   Sinus CT has been ordered to rule out polyps and other potential structural etiologies.  Chronic maxillary sinusitis  Treatment plan as outlined above for mixed rhinitis.  The following labs have been ordered: CBC with differential, alpha-1 antitrypsin level, P-ANCA, C-ANCA, CRP, ANA, IgG, IgA and IgM as well as tetanus IgG and pneumococcal IgG titers. Post-vaccination titers will be drawn to assess response if IgG and pre-vaccination titers are low.    Judy Ramos will be called with further recommendations and follow-up instructions once the labs have returned.  Persistent cough The most common causes of chronic cough include the following: upper airway cough syndrome (UACS) which is caused by variety of rhinosinus conditions; asthma; gastroesophageal reflux disease (GERD); chronic bronchitis from cigarette smoking or other inhaled environmental irritants; non-asthmatic eosinophilic bronchitis; and bronchiectasis. In prospective studies, these conditions have accounted for up to 94% of the causes of chronic cough in immunocompetent adults. The history and physical examination suggest that her cough is multifactorial. We will regroup in 6 weeks to assess treatment response and adjust therapy accordingly.  A prescription has been provided for [facility] (mometasone/formoterol) 200/5 g, 2 inhalations twice a day.  To maximize pulmonary deposition, a spacer has been provided along with instructions for its proper administration with an HFA inhaler.  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  A prescription has been provided for Dexilant  30 mg daily  30 minutes prior to meal.  Chest x-ray has been ordered.  Esophageal reflux  Continue appropriate lifestyle modifications.  A prescription has been provided for dexlansoprazole (as above).    Meds ordered this encounter  Medications  . Azelastine-Fluticasone 137-50 MCG/ACT SUSP    Sig: ONE SPRAY EACH NOSTRIL TWICE A DAY IF NEEDED FOR NASAL CONGESTION OR DRAINAGE.    Dispense:  23 g    Refill:  5  . mometasone-formoterol (DULERA) 200-5 MCG/ACT AERO    Sig: TWO PUFFS TWICE A DAY WITH SPACER TO PREVENT COUGH OR WHEEZE. RINSE, GARGLE AND SPIT AFTER USE.    Dispense:  1 Inhaler    Refill:  5  . Dexlansoprazole 30 MG capsule    Sig: ONE CAPSULE ONCE A DAY BEFORE A MEAL.    Dispense:  30 capsule    Refill:  5    Diagnositics: Spirometry: FVC is 2.39 L and FEV1 is 1.90 L (67% predicted) with 10 mL post bronchodilator improvment. Mild restrictive pattern without significant reversibility. Epicutaneous testing: Negative despite a positive histamine control. Intradermal testing: borderline positive to dust mite antigen.    Physical examination: Blood pressure 104/70, pulse 82, temperature 97.7 F (36.5 C), temperature source Oral, resp. rate 16, height 5' 5.3" (1.659 m), weight 120 lb 9.6 oz (54.704 kg).  General: Alert, interactive, in no acute distress. HEENT: TMs pearly gray, turbinates moderately edematous with thick discharge, possible right sided polyp, post-pharynx markedly erythematous. Neck: Supple without lymphadenopathy. Lungs: Mildly decreased breath sounds bilaterally without wheezing, rhonchi or rales. CV: Normal S1, S2 without murmurs. Abdomen: Nondistended, nontender. Skin: Warm and dry, without lesions or rashes. Extremities:  No clubbing, cyanosis or edema. Neuro:   Grossly intact.  Review of systems:  Review of Systems  Constitutional: Positive for malaise/fatigue. Negative for fever, chills and weight loss.  HENT: Positive for congestion, ear  pain and sore throat. Negative for nosebleeds.   Eyes: Negative for blurred vision.  Respiratory: Positive for cough, shortness of breath and wheezing. Negative for hemoptysis.   Cardiovascular: Negative for chest pain.  Gastrointestinal: Positive for heartburn. Negative for diarrhea and constipation.  Genitourinary: Negative for dysuria.  Musculoskeletal: Negative for myalgias and joint pain.  Skin: Negative for itching and rash.  Neurological: Negative for dizziness.  Endo/Heme/Allergies: Does not bruise/bleed easily.    Past medical history:  Past Medical History  Diagnosis Date  . Gallstone   . Hypothyroidism   . Thyroid nodule     Past surgical history:  Past Surgical History  Procedure Laterality Date  . Cesarean section      x 2  . Lasik    . Cholecystectomy  05/1999  . Breast enhancement surgery  2001  . Uterine ablation  2011  . Dilation and curettage of uterus  2011  . Vein ligation and stripping      Family history: Family History  Problem Relation Age of Onset  . Neuropathy    . Lymphoma Paternal Uncle   . Cancer Paternal Grandfather   . Hypertension Mother   . Arthritis Mother   . Hypertension Father   . Arthritis Father   . Stroke Paternal Grandmother     Social history: Social History   Social History  . Marital Status: Married    Spouse Name: N/A  . Number of Children: N/A  . Years of Education: N/A   Occupational History  .      p/t with husband   Social History Main Topics  . Smoking  status: Never Smoker   . Smokeless tobacco: Never Used  . Alcohol Use: 8.4 oz/week    14 Glasses of wine per week  . Drug Use: No  . Sexual Activity:    Partners: Male   Other Topics Concern  . Not on file   Social History Narrative   Regular exercise- yes    Environmental History: The patient lives in a 53 year old house with carpeting in the bedroom and central air/heat.  There are 2 dogs in house which do not have access to her bedroom.  She  is a nonsmoker and is not exposed to significant secondhand cigarette smoke, fumes, chemicals, or dust.    Medication List       This list is accurate as of: 11/10/15  1:11 PM.  Always use your most recent med list.               albuterol 108 (90 Base) MCG/ACT inhaler  Commonly known as:  PROVENTIL HFA;VENTOLIN HFA  Inhale 2 puffs into the lungs every 6 (six) hours as needed for wheezing or shortness of breath.     Azelastine-Fluticasone 137-50 MCG/ACT Susp  ONE SPRAY EACH NOSTRIL TWICE A DAY IF NEEDED FOR NASAL CONGESTION OR DRAINAGE.     BLISOVI 24 FE 1-20 MG-MCG(24) tablet  Generic drug:  Norethindrone Acetate-Ethinyl Estrad-FE  Take 1 tablet by mouth daily.     BREO ELLIPTA 100-25 MCG/INH Aepb  Generic drug:  fluticasone furoate-vilanterol  INHALE 1 PUFF DAILY AND RINSE MOUTH AFTER USE.     Dexlansoprazole 30 MG capsule  ONE CAPSULE ONCE A DAY BEFORE A MEAL.     fluticasone 50 MCG/ACT nasal spray  Commonly known as:  FLONASE  Place 2 sprays into both nostrils daily. Reported on 11/10/2015     levothyroxine 50 MCG tablet  Commonly known as:  SYNTHROID, LEVOTHROID  TAKE ONE TABLET BY MOUTH ONCE DAILY     mometasone-formoterol 200-5 MCG/ACT Aero  Commonly known as:  DULERA  TWO PUFFS TWICE A DAY WITH SPACER TO PREVENT COUGH OR WHEEZE. RINSE, GARGLE AND SPIT AFTER USE.     pravastatin 40 MG tablet  Commonly known as:  PRAVACHOL  Take 1 tablet (40 mg total) by mouth daily.     predniSONE 10 MG tablet  Commonly known as:  DELTASONE  TAKE 2 TABS EVERY MORNING AFTER BREAKFAST FOR 7 DAYS THEN 1 EVERY MORNING AFTER BREAKFAST X7 DAYS        Known medication allergies: Allergies  Allergen Reactions  . Amoxil [Amoxicillin]   . Penicillins Rash    I appreciate the opportunity to take part in this Briceida's care. Please do not hesitate to contact me with questions.  Sincerely,   R. Edgar Frisk, MD

## 2015-11-10 NOTE — Assessment & Plan Note (Addendum)
   Aeroallergen avoidance measures have been discussed and provided in written form.   A prescription has been provided for Dymista nasal spray, one spray per nostril 2 times daily as needed. Proper nasal spray technique has been discussed and demonstrated.  Nasal saline lavage (NeilMed) as needed has been recommended along with instructions for proper administration.  Guaifenesin 1200 mg (plus/minus pseudoephedrine 120 mg) twice daily as needed with adequate hydration. Pseudoephedrine is only to be used for short-term relief of nasal/sinus congestion. Long-term use is discouraged due to potential side effects.   Sinus CT has been ordered to rule out polyps and other potential structural etiologies.

## 2015-11-10 NOTE — Assessment & Plan Note (Addendum)
The most common causes of chronic cough include the following: upper airway cough syndrome (UACS) which is caused by variety of rhinosinus conditions; asthma; gastroesophageal reflux disease (GERD); chronic bronchitis from cigarette smoking or other inhaled environmental irritants; non-asthmatic eosinophilic bronchitis; and bronchiectasis. In prospective studies, these conditions have accounted for up to 94% of the causes of chronic cough in immunocompetent adults. The history and physical examination suggest that her cough is multifactorial. We will regroup in 6 weeks to assess treatment response and adjust therapy accordingly.  A prescription has been provided for [facility] (mometasone/formoterol) 200/5 g, 2 inhalations twice a day.  To maximize pulmonary deposition, a spacer has been provided along with instructions for its proper administration with an HFA inhaler.  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  A prescription has been provided for Dexilant 30 mg daily 30 minutes prior to meal.  Chest x-ray has been ordered.

## 2015-11-10 NOTE — Assessment & Plan Note (Signed)
   Continue appropriate lifestyle modifications.  A prescription has been provided for dexlansoprazole (as above).

## 2015-11-10 NOTE — Assessment & Plan Note (Addendum)
   Treatment plan as outlined above for mixed rhinitis.  The following labs have been ordered: CBC with differential, alpha-1 antitrypsin level, P-ANCA, C-ANCA, CRP, ANA, IgG, IgA and IgM as well as tetanus IgG and pneumococcal IgG titers. Post-vaccination titers will be drawn to assess response if IgG and pre-vaccination titers are low.    Judy Ramos will be called with further recommendations and follow-up instructions once the labs have returned.

## 2015-11-11 ENCOUNTER — Other Ambulatory Visit: Payer: Self-pay | Admitting: Allergy and Immunology

## 2015-11-11 ENCOUNTER — Telehealth: Payer: Self-pay | Admitting: Allergy

## 2015-11-11 ENCOUNTER — Ambulatory Visit (HOSPITAL_BASED_OUTPATIENT_CLINIC_OR_DEPARTMENT_OTHER)
Admission: RE | Admit: 2015-11-11 | Discharge: 2015-11-11 | Disposition: A | Discharge status: Home / Self Care | Payer: BLUE CROSS/BLUE SHIELD | Source: Ambulatory Visit | Attending: Allergy and Immunology | Admitting: Allergy and Immunology

## 2015-11-11 ENCOUNTER — Telehealth: Payer: Self-pay

## 2015-11-11 DIAGNOSIS — R9389 Abnormal findings on diagnostic imaging of other specified body structures: Secondary | ICD-10-CM

## 2015-11-11 DIAGNOSIS — J320 Chronic maxillary sinusitis: Secondary | ICD-10-CM | POA: Insufficient documentation

## 2015-11-11 DIAGNOSIS — R05 Cough: Secondary | ICD-10-CM | POA: Insufficient documentation

## 2015-11-11 NOTE — Progress Notes (Signed)
Order has been signed and she is scheduled for the chest CT with contrast tomorrow at 9am.

## 2015-11-11 NOTE — Telephone Encounter (Signed)
Called [employer] to get prior authorization for Sinus CT scan at [place].  Authorization number [id number] valid 11/11/15 to 12/10/15.  Called [facility] at [place] and gave all info.  They will call patient to schedule Limited Sinus CT without Contrast.

## 2015-11-11 NOTE — Telephone Encounter (Signed)
Please prescribe azelastine nasal spray, 2 sprays per nostril 2 times daily as needed and fluticasone nasal spray, 1 sprays per nostril 2 times daily as needed.  Thanks.

## 2015-11-11 NOTE — Telephone Encounter (Signed)
Judy Ramos CALLED AND SAID THE NASAL SPRAY THAT DR BOBBITT PRESCRIBED WAS TO HIGH. WANTED TO KNOW IF HE COULD CALL IN SOMETHING ELSE . I THINK IT WAS DYMISTA. SHE USES [facility]  [place]

## 2015-11-12 ENCOUNTER — Telehealth: Payer: Self-pay

## 2015-11-12 ENCOUNTER — Ambulatory Visit (HOSPITAL_BASED_OUTPATIENT_CLINIC_OR_DEPARTMENT_OTHER)
Admission: RE | Admit: 2015-11-12 | Discharge: 2015-11-12 | Disposition: A | Discharge status: Home / Self Care | Payer: BLUE CROSS/BLUE SHIELD | Source: Ambulatory Visit | Attending: Allergy and Immunology | Admitting: Allergy and Immunology

## 2015-11-12 ENCOUNTER — Other Ambulatory Visit: Payer: Self-pay

## 2015-11-12 DIAGNOSIS — R938 Abnormal findings on diagnostic imaging of other specified body structures: Secondary | ICD-10-CM | POA: Diagnosis present

## 2015-11-12 DIAGNOSIS — J9819 Other pulmonary collapse: Secondary | ICD-10-CM | POA: Diagnosis not present

## 2015-11-12 DIAGNOSIS — R9389 Abnormal findings on diagnostic imaging of other specified body structures: Secondary | ICD-10-CM

## 2015-11-12 DIAGNOSIS — J984 Other disorders of lung: Secondary | ICD-10-CM | POA: Diagnosis not present

## 2015-11-12 LAB — IGG, IGA, IGM
IgA: 212 mg/dL (ref 69–380)
IgG (Immunoglobin G), Serum: 1350 mg/dL (ref 690–1700)
IgM, Serum: 82 mg/dL (ref 52–322)

## 2015-11-12 LAB — ANA: Anti Nuclear Antibody(ANA): NEGATIVE

## 2015-11-12 LAB — MPO/PR-3 (ANCA) ANTIBODIES
Myeloperoxidase Abs: 1.6 — ABNORMAL HIGH
Serine Protease 3: <1

## 2015-11-12 LAB — C-REACTIVE PROTEIN: CRP: 1 mg/dL — ABNORMAL HIGH (ref <0.60)

## 2015-11-12 MED ORDER — IOHEXOL 300 MG/ML  SOLN
80.0000 mL | Freq: Once | INTRAMUSCULAR | Status: AC | PRN
  Administered 2015-11-12: 75 mL via INTRAVENOUS

## 2015-11-12 MED ORDER — FLUTICASONE PROPIONATE 50 MCG/ACT NA SUSP: 1.0000 | Freq: Two times a day (BID) | NASAL | Status: DC | PRN

## 2015-11-12 MED ORDER — AZELASTINE HCL 0.15 % NA SOLN: 2.0000 | Freq: Two times a day (BID) | NASAL | Status: DC | PRN

## 2015-11-12 NOTE — Telephone Encounter (Signed)
Sent in azelastine and fluticasone 

## 2015-11-12 NOTE — Telephone Encounter (Signed)
Pantoprazole 20 mg daily before breakfast. Thanks.

## 2015-11-12 NOTE — Addendum Note (Signed)
Addended by: Angelica Ran on: 11/12/2015 02:20 PM   Modules accepted: Orders

## 2015-11-12 NOTE — Telephone Encounter (Signed)
[employer] [place] DENIED [place].  PT HAS TRIED OMEPRAZOLE BUT MUST ALSO TRY LANSOPRAZOLE, ESOMEPRAZOLE OR PANTOPRAZOLE (NO MG SPECIFIED).  PLEASE ADVISE.

## 2015-11-13 ENCOUNTER — Encounter: Payer: Self-pay | Admitting: Internal Medicine

## 2015-11-13 ENCOUNTER — Ambulatory Visit (INDEPENDENT_AMBULATORY_CARE_PROVIDER_SITE_OTHER): Payer: BLUE CROSS/BLUE SHIELD | Admitting: Internal Medicine

## 2015-11-13 VITALS — BP 138/78 | HR 78 | Ht 65.0 in | Wt 121.6 lb

## 2015-11-13 DIAGNOSIS — J45991 Cough variant asthma: Secondary | ICD-10-CM | POA: Diagnosis not present

## 2015-11-13 DIAGNOSIS — J9811 Atelectasis: Secondary | ICD-10-CM

## 2015-11-13 DIAGNOSIS — J4540 Moderate persistent asthma, uncomplicated: Secondary | ICD-10-CM | POA: Insufficient documentation

## 2015-11-13 DIAGNOSIS — J4530 Mild persistent asthma, uncomplicated: Secondary | ICD-10-CM | POA: Insufficient documentation

## 2015-11-13 LAB — ANCA SCREEN W REFLEX TITER: ANCA Screen: NEGATIVE

## 2015-11-13 LAB — ALPHA-1-ANTITRYPSIN: A-1 Antitrypsin, Ser: 212 mg/dL — ABNORMAL HIGH (ref 83–199)

## 2015-11-13 MED ORDER — FAMOTIDINE 20 MG PO TABS: ORAL_TABLET | ORAL | Status: DC

## 2015-11-13 MED ORDER — ACAPELLA MISC: Status: DC

## 2015-11-13 MED ORDER — LEVOFLOXACIN 500 MG PO TABS: 500.0000 mg | ORAL_TABLET | Freq: Every day | ORAL | Status: DC

## 2015-11-13 MED ORDER — ACETAMINOPHEN-CODEINE #3 300-30 MG PO TABS: ORAL_TABLET | ORAL | Status: DC

## 2015-11-13 MED ORDER — PANTOPRAZOLE SODIUM 40 MG PO TBEC: 40.0000 mg | DELAYED_RELEASE_TABLET | Freq: Every day | ORAL | Status: DC

## 2015-11-13 NOTE — Patient Instructions (Signed)
Prednisone finish the taper and levaquin 500 mg daily x 10 days   For cough > mucinex dm 1200 every 12 hours and use the flutter valve and if still coughing ok to use tylenol #3 one every 4 hours as needed   Pantoprazole (protonix) 40 mg   Take  30-60 min before first meal of the day and Pepcid (famotidine)  20 mg one @  bedtime until return to office - this is the best way to tell whether stomach acid is contributing to your problem.    GERD (REFLUX)  is an extremely common cause of respiratory symptoms just like yours , many times with no obvious heartburn at all.    It can be treated with medication, but also with lifestyle changes including elevation of the head of your bed (ideally with 6 inch  bed blocks),  Smoking cessation, avoidance of late meals, excessive alcohol, and avoid fatty foods, chocolate, peppermint, colas, red wine, and acidic juices such as orange juice.  NO MINT OR MENTHOL PRODUCTS SO NO COUGH DROPS  USE SUGARLESS CANDY INSTEAD (Jolley ranchers or Stover's or Life Savers) or even ice chips will also do - the key is to swallow to prevent all throat clearing. NO OIL BASED VITAMINS - use powdered substitutes.   Stay on dulera 200 Take 2 puffs first thing in am and then another 2 puffs about 12 hours later.   Work on inhaler technique:  relax and gently blow all the way out then take a nice smooth deep breath back in, triggering the inhaler at same time you start breathing in.  Hold for up to 5 seconds if you can. Blow out thru nose. Rinse and gargle with water when done  .Please schedule a follow up office visit in 2  weeks, sooner if needed cxr on return

## 2015-11-13 NOTE — Assessment & Plan Note (Signed)
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

## 2015-11-13 NOTE — Assessment & Plan Note (Signed)
DDX of  difficult airways management almost all start with A and  include Adherence, Ace Inhibitors, Acid Reflux, Active Sinus Disease, Alpha 1 Antitripsin deficiency, Anxiety masquerading as Airways dz,  ABPA,  Allergy(esp in young), Aspiration (esp in elderly), Adverse effects of meds,  Active smokers, A bunch of PE's (a small clot burden can't cause this syndrome unless there is already severe underlying pulm or vascular dz with poor reserve) plus two Bs  = Bronchiectasis and Beta blocker use..and one C= CHF  Adherence is always the initial "prime suspect" and is a multilayered concern that requires a "trust but verify" approach in every patient - starting with knowing how to use medications, especially inhalers, correctly, keeping up with refills and understanding the fundamental difference between maintenance and prns vs those medications only taken for a very short course and then stopped and not refilled.  - The proper method of use, as well as anticipated side effects, of a metered-dose inhaler are discussed and demonstrated to the patient. Improved effectiveness after extensive coaching during this visit to a level of approximately 75 % from a baseline of < 25 %   ? Acid (or non-acid) GERD > always difficult to exclude as up to 75% of pts in some series report no assoc GI/ Heartburn symptoms> rec max (24h)  acid suppression and diet restrictions/ reviewed and instructions given in writing.   ? ABPA /allergy > w/u in progress by Dr Starling Manns  ? Active sinus dz > levaquin 500 x 10 days and regroup in 2 weeks   I had an extended discussion with the patient reviewing all relevant studies completed to date and  lasting 35 minutes of a 60 minute visit    Each maintenance medication was reviewed in detail including most importantly the difference between maintenance and prns and under what circumstances the prns are to be triggered using an action plan format that is not reflected in the computer  generated alphabetically organized AVS.    Please see instructions for details which were reviewed in writing and the patient given a copy highlighting the part that I personally wrote and discussed at today's ov.

## 2015-11-13 NOTE — Progress Notes (Signed)
Subjective:    Patient ID: Judy Ramos, female    DOB: 12/23/1960,     MRN: [id number]  HPI  89 yowf never smoker Dec 2014 with acute onset cough requiring albuterol > Paz added advair breathing better but cough persisted attributed to sinus dz  > ENT [place] > neg allergy testing > sept 2016 [facility] for dx of sinusitis but not much better > [facility] eval 11/08/14 >  CXR > CT chest > referred to pulmonary clinic 11/13/2015 by Dr Melvyn Novas   11/13/2015 [facility] office visit/ Nishi Neiswonger   Chief Complaint  Patient presents with  . Pulmonary Consult    Referred by Dr. Verlin Fester. Pt c/o cough x 3-4 wks. She was dxed with a collapsed lung recently. Her cough is prod with white sputum.   really never completely better since onset of symptoms Dec 2014 and very poor insight into how to use her meds, esp her inhalers, just changed from dpi to dulera hfa 2002bid  X 3 d prior to [place]. Had stopped using nasal irrigation until recently and much worse x 4 weeks with pnds/ nasal congestion/congested cough 24/7 but esp in am.  No obvious other patterns in day to day or daytime variabilty or assoc sob except during coughing fits  or cp or chest tightness, subjective wheeze overt    hb symptoms. No unusual exp hx or h/o childhood pna/ asthma or knowledge of premature birth.  Sleeping ok without nocturnal  or early am exacerbation  of respiratory  c/o's or need for noct saba. Also denies any obvious fluctuation of symptoms with weather or environmental changes or other aggravating or alleviating factors except as outlined above   Current Medications, Allergies, Complete Past Medical History, Past Surgical History, Family History, and Social History were reviewed in [employer] record.              Review of Systems  Constitutional: Negative for fever, chills and unexpected weight change.  HENT: Positive for congestion, postnasal drip and sinus pressure. Negative for dental  problem, ear pain, nosebleeds, rhinorrhea, sneezing, sore throat, trouble swallowing and voice change.   Eyes: Negative for visual disturbance.  Respiratory: Positive for cough and shortness of breath. Negative for choking.   Cardiovascular: Negative for chest pain and leg swelling.  Gastrointestinal: Negative for vomiting, abdominal pain and diarrhea.  Genitourinary: Negative for difficulty urinating.  Musculoskeletal: Negative for arthralgias.  Skin: Negative for rash.  Neurological: Negative for tremors, syncope and headaches.  Hematological: Does not bruise/bleed easily.       Objective:   Physical Exam  Anxious but quite pleasant amb thin wf nad congested/ rattling cough with thick brown stringy/ropey mucus plug    Wt Readings from Last 3 Encounters:  11/13/15 121 lb 9.6 oz (55.157 kg)  11/10/15 120 lb 9.6 oz (54.704 kg)  11/09/13 133 lb (60.328 kg)    Vital signs reviewed  HEENT: nl dentition, turbinates, and oropharynx. Nl external ear canals without cough reflex   NECK :  without JVD/Nodes/TM/ nl carotid upstrokes bilaterally   LUNGS: no acc muscle use,  Nl contour chest which is clear to A and P bilaterally without cough on insp or exp maneuvers   CV:  RRR  no s3 or murmur or increase in P2, no edema   ABD:  soft and nontender with nl inspiratory excursion in the supine position. No bruits or organomegaly, bowel sounds nl  MS:  Nl gait/ ext warm without  deformities, calf tenderness, cyanosis or clubbing No obvious joint restrictions   SKIN: warm and dry without lesions    NEURO:  alert, approp, nl sensorium with  no motor deficits     I personally reviewed images and agree with radiology impression as follows:  CT Chest   11/12/15 1. Collapse of the right upper lobe anteriorly and medially with obstruction of the right upper lobe bronchus. Recommend bronchoscopy to exclude an endobronchial lesion. No definite mass or adenopathy is seen. 2. Mild curvature  of the thoracic spine convex to the right with mild degenerative spurring.       Assessment & Plan:

## 2015-11-13 NOTE — Telephone Encounter (Signed)
Patient seen Dr. Melvyn Novas today and he gave patient rx for Pantoprazole  40mg .

## 2015-11-14 LAB — STREP PNEUMONIAE 23 SEROTYPES IGG
Serotype 1 (1): 0.4 ug/mL
Serotype 12 (12F): <0.3 ug/mL
Serotype 14 (14): 6.9 ug/mL
Serotype 17 (17F): 0.6 ug/mL
Serotype 19 (19F): 0.6 ug/mL
Serotype 2 (2): 0.5 ug/mL
Serotype 20 (20): 2.3 ug/mL
Serotype 22 (22F): <0.3 ug/mL
Serotype 23 (23F): 0.4 ug/mL
Serotype 26 (6B): 2.5 ug/mL
Serotype 3 (3): <0.3 ug/mL
Serotype 34 (10A): 0.5 ug/mL
Serotype 4 (4): <0.3 ug/mL
Serotype 43 (11A): <0.3 ug/mL
Serotype 5 (5): 0.7 ug/mL
Serotype 51 (7F): 2.5 ug/mL
Serotype 54 (15B): <0.3 ug/mL
Serotype 56 (18C): 1.3 ug/mL
Serotype 57 (19A): 0.6 ug/mL
Serotype 68 (9V): 0.7 ug/mL
Serotype 70 (33F): 0.4 ug/mL
Serotype 8 (8): 0.3 ug/mL
Serotype 9 (9N): 0.6 ug/mL

## 2015-11-14 LAB — TETANUS ANTIBODY, IGG: Tetanus Antitoxid Ab: >7 IU/mL (ref >0.15)

## 2015-11-17 ENCOUNTER — Telehealth: Payer: Self-pay | Admitting: Allergy and Immunology

## 2015-11-17 NOTE — Telephone Encounter (Signed)
I called to check in on Judy Ramos and her progress. She saw Dr. Melvyn Novas and was prescribed Levaquin. She will have her CBC w/ diff drawn next week after having been off of prednisone for at least 10 days. She has no new complaints today.

## 2015-11-17 NOTE — Telephone Encounter (Signed)
Noted. I called and spoke with the patient about her progress. See telephone contact note.

## 2015-12-01 ENCOUNTER — Telehealth: Payer: Self-pay

## 2015-12-01 NOTE — Telephone Encounter (Signed)
CBC seen open in system; CBC reordered from visit date.  Patient informed

## 2015-12-01 NOTE — Telephone Encounter (Signed)
Patient Last seen by Dr. Verlin Fester on 11/10/2015. She stated she thought she had one more lab to do, she went to [facility] and they didn't have record of anything else that needed to be done.  Please Advise  Thanks

## 2015-12-01 NOTE — Progress Notes (Signed)
Original CBC order not found by [facility].  New CBC placed from this visit

## 2015-12-01 NOTE — Addendum Note (Signed)
Addended by: Carilyn Goodpasture on: 12/01/2015 11:57 AM   Modules accepted: Orders

## 2015-12-04 ENCOUNTER — Other Ambulatory Visit: Payer: Self-pay | Admitting: Allergy and Immunology

## 2015-12-04 DIAGNOSIS — J3089 Other allergic rhinitis: Secondary | ICD-10-CM | POA: Diagnosis not present

## 2015-12-04 LAB — CBC WITH DIFFERENTIAL/PLATELET
Basophils Absolute: 61 cells/uL (ref 0–200)
Basophils Relative: 1 %
Eosinophils Absolute: 488 cells/uL (ref 15–500)
Eosinophils Relative: 8 %
HCT: 40 % (ref 35.0–45.0)
Hemoglobin: 13.1 g/dL (ref 11.7–15.5)
Lymphocytes Relative: 36 %
Lymphs Abs: 2196 cells/uL (ref 850–3900)
MCH: 32.6 pg (ref 27.0–33.0)
MCHC: 32.8 g/dL (ref 32.0–36.0)
MCV: 99.5 fL (ref 80.0–100.0)
MPV: 10.3 fL (ref 7.5–12.5)
Monocytes Absolute: 366 cells/uL (ref 200–950)
Monocytes Relative: 6 %
Neutro Abs: 2989 cells/uL (ref 1500–7800)
Neutrophils Relative %: 49 %
Platelets: 262 10*3/uL (ref 140–400)
RBC: 4.02 MIL/uL (ref 3.80–5.10)
RDW: 13.1 % (ref 11.0–15.0)
WBC: 6.1 10*3/uL (ref 3.8–10.8)

## 2015-12-05 ENCOUNTER — Ambulatory Visit (INDEPENDENT_AMBULATORY_CARE_PROVIDER_SITE_OTHER)
Admission: RE | Admit: 2015-12-05 | Discharge: 2015-12-05 | Disposition: A | Discharge status: Home / Self Care | Payer: BLUE CROSS/BLUE SHIELD | Source: Ambulatory Visit | Attending: Internal Medicine | Admitting: Internal Medicine

## 2015-12-05 ENCOUNTER — Ambulatory Visit (INDEPENDENT_AMBULATORY_CARE_PROVIDER_SITE_OTHER): Payer: BLUE CROSS/BLUE SHIELD | Admitting: Internal Medicine

## 2015-12-05 ENCOUNTER — Encounter: Payer: Self-pay | Admitting: Internal Medicine

## 2015-12-05 VITALS — BP 114/70 | HR 89 | Ht 65.5 in | Wt 123.4 lb

## 2015-12-05 DIAGNOSIS — J9811 Atelectasis: Secondary | ICD-10-CM

## 2015-12-05 DIAGNOSIS — J45991 Cough variant asthma: Secondary | ICD-10-CM | POA: Diagnosis not present

## 2015-12-05 MED ORDER — MOMETASONE FURO-FORMOTEROL FUM 100-5 MCG/ACT IN AERO: 2.0000 | INHALATION_SPRAY | Freq: Two times a day (BID) | RESPIRATORY_TRACT | Status: DC

## 2015-12-05 NOTE — Progress Notes (Signed)
Subjective:    Patient ID: Judy Ramos, female    DOB: 10/03/1960,     MRN: [id number]    Brief patient profile:  70 yowf never smoker Dec 2014 with acute onset cough requiring albuterol > Paz added advair breathing better but cough persisted attributed to sinus dz  > ENT [place] > neg allergy testing > sept 2016 [facility] for dx of sinusitis but not much better > [facility] eval 11/08/14 >  CXR > CT chest > referred to pulmonary clinic 11/13/2015 by Dr Melvyn Novas   History of Present Illness  11/13/2015 1st [facility] office visit/ [facility]   Chief Complaint  Patient presents with  . Pulmonary Consult    Referred by Dr. Verlin Fester. Pt c/o cough x 3-4 wks. She was dxed with a collapsed lung recently. Her cough is prod with white sputum.   really never completely better since onset of symptoms Dec 2014 and very poor insight into how to use her meds, esp her inhalers, just changed from dpi to dulera hfa 200 2bid  X 3 d prior to OV. Had stopped using nasal irrigation until recently and much worse x 4 weeks with pnds/ nasal congestion/congested cough 24/7 but esp in am. rec Prednisone finish the taper and levaquin 500 mg daily x 10 days  For cough > mucinex dm 1200 every 12 hours and use the flutter valve and if still coughing ok to use tylenol #3 one every 4 hours as needed  Pantoprazole (protonix) 40 mg   Take  30-60 min before first meal of the day and Pepcid (famotidine)  20 mg one @  bedtime until return to office  GERD diet   Stay on dulera 200 Take 2 puffs first thing in am and then another 2 puffs about 12 hours later.  Work on inhaler technique:   levaquin 500  Mg daily x 10 d    12/05/2015  f/u ov/[facility] re: cough/ atx  Chief Complaint  Patient presents with  . Follow-up    Cough has improved some. No new co's today.   last prednisone 11/17/2015  Cough occurs with exertion/ using saba once or twice  No obvious day to day or daytime variability or assoc  excess/ purulent sputum  or mucus plugs   cp or chest tightness, subjective wheeze or overt sinus or hb symptoms. No unusual exp hx or h/o childhood pna/ asthma or knowledge of premature birth.  Sleeping ok without nocturnal  or early am exacerbation  of respiratory  c/o's or need for noct saba. Also denies any obvious fluctuation of symptoms with weather or environmental changes or other aggravating or alleviating factors except as outlined above   Current Medications, Allergies, Complete Past Medical History, Past Surgical History, Family History, and Social History were reviewed in [employer] record.  ROS  The following are not active complaints unless bolded sore throat, dysphagia, dental problems, itching, sneezing,  nasal congestion or excess/ purulent secretions, ear ache,   fever, chills, sweats, unintended wt loss, classically pleuritic or exertional cp, hemoptysis,  orthopnea pnd or leg swelling, presyncope, palpitations, abdominal pain, anorexia, nausea, vomiting, diarrhea  or change in bowel or bladder habits, change in stools or urine, dysuria,hematuria,  rash, arthralgias, visual complaints, headache, numbness, weakness or ataxia or problems with walking or coordination,  change in mood/affect or memory.  Objective:   Physical Exam  Anxious but quite pleasant amb thin wf nad min congested sounding cough     12/05/2015        123   11/13/15 121 lb 9.6 oz (55.157 kg)  11/10/15 120 lb 9.6 oz (54.704 kg)  11/09/13 133 lb (60.328 kg)    Vital signs reviewed  HEENT: nl dentition, turbinates, and oropharynx. Nl external ear canals without cough reflex   NECK :  without JVD/Nodes/TM/ nl carotid upstrokes bilaterally   LUNGS: no acc muscle use,  Nl contour chest which is clear to A and P bilaterally without cough on insp or exp maneuvers   CV:  RRR  no s3 or murmur or increase in P2, no edema   ABD:  soft and nontender with nl inspiratory excursion  in the supine position. No bruits or organomegaly, bowel sounds nl  MS:  Nl gait/ ext warm without deformities, calf tenderness, cyanosis or clubbing No obvious joint restrictions   SKIN: warm and dry without lesions    NEURO:  alert, approp, nl sensorium with  no motor deficits      CXR PA and Lateral:   12/05/2015 :    I personally reviewed images and agree with radiology impression as follows:    Persistent atelectasis/consolidation in right upper lobe medially. No pulmonary edema. Left lung is clear.    Assessment & Plan:

## 2015-12-05 NOTE — Patient Instructions (Signed)
Try dulera 100 Take 2 puffs first thing in am and then another 2 puffs about 12 hours later.   Work on inhaler technique:  relax and gently blow all the way out then take a nice smooth deep breath back in, triggering the inhaler at same time you start breathing in.  Hold for up to 5 seconds if you can. Blow out thru nose. Rinse and gargle with water when done   Keep head of bed elevated   Come to outpatient registration at [facility] (behind the ER) at 715 am Wednesday April 12th with nothing to eat or drink after midnight Tuesday for Bronchoscopy

## 2015-12-08 NOTE — Assessment & Plan Note (Signed)
Improved on dulera 200 2bid > no change rx

## 2015-12-08 NOTE — Assessment & Plan Note (Signed)
No better at all - mucus plug is most likely dx though unsual location so need to r/o carcinooid or some other benign reason in this never smoker  Discussed in detail all the  indications, usual  risks and alternatives  relative to the benefits with patient who agrees to proceed with bronchoscopy with biopsy. If needed

## 2015-12-09 NOTE — H&P (Signed)
Subjective:   Patient ID: Judy Ramos, female DOB: 11/24/1960, MRN: [id number]   Brief patient profile:  76 yowf never smoker Dec 2014 with acute onset cough requiring albuterol > Paz added advair breathing better but cough persisted attributed to sinus dz > ENT [place] > neg allergy testing > sept 2016 [facility] for dx of sinusitis but not much better > [facility] eval 11/08/14 > CXR > CT chest > referred to pulmonary clinic 11/13/2015 by Dr Melvyn Novas   History of Present Illness  11/13/2015 1st [place] [facility] office visit/ Kristen Fromm  Chief Complaint  Patient presents with  . Pulmonary Consult    Referred by Dr. Verlin Fester. Pt c/o cough x 3-4 wks. She was dxed with a collapsed lung recently. Her cough is prod with white sputum.   really never completely better since onset of symptoms Dec 2014 and very poor insight into how to use her meds, esp her inhalers, just changed from dpi to dulera hfa 200 2bid X 3 d prior to OV. Had stopped using nasal irrigation until recently and much worse x 4 weeks with pnds/ nasal congestion/congested cough 24/7 but esp in am. rec Prednisone finish the taper and levaquin 500 mg daily x 10 days  For cough > mucinex dm 1200 every 12 hours and use the flutter valve and if still coughing ok to use tylenol #3 one every 4 hours as needed  Pantoprazole (protonix) 40 mg Take 30-60 min before first meal of the day and Pepcid (famotidine) 20 mg one @ bedtime until return to office  GERD diet  Stay on dulera 200 Take 2 puffs first thing in am and then another 2 puffs about 12 hours later.  Work on inhaler technique:  levaquin 500 Mg daily x 10 d    12/05/2015 f/u ov/[facility] re: cough/ atx  Chief Complaint  Patient presents with  . Follow-up    Cough has improved some. No new co's today.   last prednisone 11/17/2015  Cough occurs with exertion/ using saba once or twice  No obvious day to day or daytime variability or assoc  excess/ purulent sputum or mucus plugs cp or chest tightness, subjective wheeze or overt sinus or hb symptoms. No unusual exp hx or h/o childhood pna/ asthma or knowledge of premature birth.  Sleeping ok without nocturnal or early am exacerbation of respiratory c/o's or need for noct saba. Also denies any obvious fluctuation of symptoms with weather or environmental changes or other aggravating or alleviating factors except as outlined above   Current Medications, Allergies, Complete Past Medical History, Past Surgical History, Family History, and Social History were reviewed in [employer] record.  ROS The following are not active complaints unless bolded sore throat, dysphagia, dental problems, itching, sneezing, nasal congestion or excess/ purulent secretions, ear ache, fever, chills, sweats, unintended wt loss, classically pleuritic or exertional cp, hemoptysis, orthopnea pnd or leg swelling, presyncope, palpitations, abdominal pain, anorexia, nausea, vomiting, diarrhea or change in bowel or bladder habits, change in stools or urine, dysuria,hematuria, rash, arthralgias, visual complaints, headache, numbness, weakness or ataxia or problems with walking or coordination, change in mood/affect or memory.                 Objective:  Physical Exam  Anxious but quite pleasant amb thin wf nad min congested sounding cough   12/05/2015 123   11/13/15 121 lb 9.6 oz (55.157 kg)  11/10/15 120 lb 9.6 oz (54.704 kg)  11/09/13 133 lb (60.328 kg)  Vital signs reviewed  HEENT: nl dentition, turbinates, and oropharynx. Nl external ear canals without cough reflex   NECK : without JVD/Nodes/TM/ nl carotid upstrokes bilaterally   LUNGS: no acc muscle use, Nl contour chest which is clear to A and P bilaterally without cough on insp or exp maneuvers   CV: RRR no s3 or murmur or increase in P2, no edema   ABD: soft and  nontender with nl inspiratory excursion in the supine position. No bruits or organomegaly, bowel sounds nl  MS: Nl gait/ ext warm without deformities, calf tenderness, cyanosis or clubbing No obvious joint restrictions   SKIN: warm and dry without lesions   NEURO: alert, approp, nl sensorium with no motor deficits    CXR PA and Lateral: 12/05/2015 :  I personally reviewed images and agree with radiology impression as follows:  Persistent atelectasis/consolidation in right upper lobe medially. No pulmonary edema. Left lung is clear.   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 If needed           12/10/2015  Day of FOB: no change hx or exam    Christinia Gully, MD Pulmonary and [place] [phone] After 5:30 PM or weekends, call [phone]

## 2015-12-10 ENCOUNTER — Ambulatory Visit (HOSPITAL_COMMUNITY)
Admission: RE | Admit: 2015-12-10 | Discharge: 2015-12-10 | Disposition: A | Discharge status: Home / Self Care | Payer: BLUE CROSS/BLUE SHIELD | Source: Ambulatory Visit | Attending: Internal Medicine | Admitting: Internal Medicine

## 2015-12-10 ENCOUNTER — Encounter (HOSPITAL_COMMUNITY)
Admission: RE | Disposition: A | Discharge status: Home / Self Care | Payer: Self-pay | Source: Ambulatory Visit | Attending: Internal Medicine

## 2015-12-10 DIAGNOSIS — J9811 Atelectasis: Secondary | ICD-10-CM | POA: Diagnosis not present

## 2015-12-10 DIAGNOSIS — J209 Acute bronchitis, unspecified: Secondary | ICD-10-CM | POA: Insufficient documentation

## 2015-12-10 DIAGNOSIS — K219 Gastro-esophageal reflux disease without esophagitis: Secondary | ICD-10-CM | POA: Diagnosis not present

## 2015-12-10 DIAGNOSIS — T17990A Other foreign object in respiratory tract, part unspecified in causing asphyxiation, initial encounter: Secondary | ICD-10-CM | POA: Insufficient documentation

## 2015-12-10 DIAGNOSIS — Z79899 Other long term (current) drug therapy: Secondary | ICD-10-CM | POA: Insufficient documentation

## 2015-12-10 DIAGNOSIS — X58XXXA Exposure to other specified factors, initial encounter: Secondary | ICD-10-CM | POA: Diagnosis not present

## 2015-12-10 HISTORY — PX: VIDEO BRONCHOSCOPY: SHX5072

## 2015-12-10 LAB — BODY FLUID CELL COUNT WITH DIFFERENTIAL
EOS FL: 68 %
Lymphs, Fluid: 6 %
MONOCYTE-MACROPHAGE-SEROUS FLUID: 21 % — AB (ref 50–90)
Neutrophil Count, Fluid: 5 % (ref 0–25)
WBC FLUID: 905 uL (ref 0–1000)

## 2015-12-10 SURGERY — VIDEO BRONCHOSCOPY WITHOUT FLUORO
Anesthesia: Moderate Sedation | Laterality: Bilateral

## 2015-12-10 MED ORDER — MIDAZOLAM HCL 5 MG/ML IJ SOLN: 1.0000 mg | Freq: Once | INTRAMUSCULAR | Status: DC

## 2015-12-10 MED ORDER — PHENYLEPHRINE HCL 0.25 % NA SOLN: 1.0000 | Freq: Four times a day (QID) | NASAL | Status: DC | PRN

## 2015-12-10 MED ORDER — LIDOCAINE HCL 1 % IJ SOLN
INTRAMUSCULAR | Status: DC | PRN
  Administered 2015-12-10: 6 mL via RESPIRATORY_TRACT

## 2015-12-10 MED ORDER — MIDAZOLAM HCL 5 MG/ML IJ SOLN
INTRAMUSCULAR | Status: AC
  Filled 2015-12-10: qty 2

## 2015-12-10 MED ORDER — PHENYLEPHRINE HCL 0.25 % NA SOLN
NASAL | Status: DC | PRN
  Administered 2015-12-10: 2 via NASAL

## 2015-12-10 MED ORDER — LIDOCAINE HCL 2 % EX GEL: 1.0000 "application " | Freq: Once | CUTANEOUS | Status: DC

## 2015-12-10 MED ORDER — MEPERIDINE HCL 100 MG/ML IJ SOLN: 100.0000 mg | Freq: Once | INTRAMUSCULAR | Status: DC

## 2015-12-10 MED ORDER — MIDAZOLAM HCL 10 MG/2ML IJ SOLN
INTRAMUSCULAR | Status: DC | PRN
  Administered 2015-12-10 (×2): 2.5 mg via INTRAVENOUS

## 2015-12-10 MED ORDER — MEPERIDINE HCL 25 MG/ML IJ SOLN
INTRAMUSCULAR | Status: DC | PRN
  Administered 2015-12-10: 50 mg via INTRAVENOUS

## 2015-12-10 MED ORDER — LIDOCAINE HCL 2 % EX GEL
CUTANEOUS | Status: DC | PRN
  Administered 2015-12-10: 1

## 2015-12-10 MED ORDER — SODIUM CHLORIDE 0.9 % IV SOLN
INTRAVENOUS | Status: DC
  Administered 2015-12-10: 08:00:00 via INTRAVENOUS

## 2015-12-10 MED ORDER — MEPERIDINE HCL 100 MG/ML IJ SOLN
INTRAMUSCULAR | Status: AC
  Filled 2015-12-10: qty 2

## 2015-12-10 NOTE — Discharge Instructions (Signed)
Flexible Bronchoscopy, Care After These instructions give you information on caring for yourself after your procedure. Your doctor may also give you more specific instructions. Call your doctor if you have any problems or questions after your procedure. HOME CARE  Do not eat or drink anything for 2 hours after your procedure. If you try to eat or drink before the medicine wears off, food or drink could go into your lungs. You could also burn yourself.  After 2 hours have passed and when you can cough and gag normally, you may eat soft food and drink liquids slowly.  The day after the test, you may eat your normal diet.  You may do your normal activities.  Keep all doctor visits. GET HELP RIGHT AWAY IF:  You get more and more short of breath.  You get light-headed.  You feel like you are going to pass out (faint).  You have chest pain.  You have new problems that worry you.  You cough up more than a little blood.  You cough up more blood than before. MAKE SURE YOU:  Understand these instructions.  Will watch your condition.  Will get help right away if you are not doing well or get worse.   This information is not intended to replace advice given to you by your health care provider. Make sure you discuss any questions you have with your health care provider.   Document Released: 06/13/2009 Document Revised: 08/21/2013 Document Reviewed: 04/20/2013 Elsevier Interactive Patient Education 2016 [place].  Nothing to eat or drink until 10:30   am    Today  12/10/2015

## 2015-12-10 NOTE — Progress Notes (Signed)
Video Bronchoscopy done Intervention Bronchial Washing done 

## 2015-12-10 NOTE — Op Note (Signed)
[facility] Cardiopulmonary Patient Name: Judy Ramos Procedure Date: 12/10/2015 MRN: [id number] Attending MD: Tanda Rockers , MD Date of Birth: 1961-02-06 CSN: [id number] Age: 55 Admit Type: Outpatient Ethnicity: Not Hispanic or Latino Procedure:            Bronchoscopy Indications:          Atelectasis of the right upper lobe Providers:            Legrand Como B. Melvyn Novas, MD, Andre Lefort RRT,RCP, Ashley Mariner RRT,RCP Referring MD:          Medicines:            Lidocaine applied to nares and subglottic space,                        Meperidine 50 mg IV, Midazolam 5 mg IV Complications:        No immediate complications Estimated Blood Loss: Estimated blood loss: none. Procedure:      Pre-Anesthesia Assessment:      - Pre-procedure physical examination revealed no contraindications to       sedation.      After obtaining informed consent, the bronchoscope was passed under       direct vision. Throughout the procedure, the patient's blood pressure,       pulse, and oxygen saturations were monitored continuously. the [id number]       [id number]) scope was introduced through the right nostril and advanced to       the tracheobronchial tree of both lungs. The procedure was accomplished       without difficulty. The patient tolerated the procedure well. Findings:      Trachea/Carina Abnormalities: Small to medium sized non-obstructing       multiple polypoid lesions were found in the middle to distal portion in       the trachea.      Right Lung Abnormalities: Mucous, plugging the airway, was found in the       right upper lobe. The mucous was mucopurulent and tenacious. The       underlying mucosa is inflamed and edematous and airways did not open       well on inspiration.      Bronchoalveolar lavage was performed in the right upper lobe of the lung       and sent for cell count, bacterial culture, viral smears & culture,       fungal analysis  and cytology for immunocompromised host protocol. 120 mL       of fluid were instilled. 25 mL were returned. The return was       mucopurulent. Mucous plugs were present in the return fluid. Multiple       specimens were obtained, and each sent for analysis. 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 significance       (see picture)      - A mucous plug was found in the right upper lobe.      - Bronchoalveolar lavage was performed. Moderate Sedation:      Moderate (conscious) sedation was personally administered by the       endoscopist. The following parameters were monitored: oxygen saturation,  heart rate, blood pressure, respiratory rate, EKG, adequacy of pulmonary       ventilation, and response to care. Total physician intraservice time was       5 minutes. Recommendation:      - Await BAL results. Procedure Code(s):      --- Professional ---      [phone], Bronchoscopy, rigid or flexible, including fluoroscopic guidance,       when performed; with bronchial alveolar lavage Diagnosis Code(s):      --- Professional ---      J98.11, Atelectasis      J39.8, Other specified diseases of upper respiratory tract      T17.990A, Other foreign object in respiratory tract, part unspecified in       causing asphyxiation, initial encounter CPT copyright 2016 American Medical Association. All rights reserved. The codes documented in this report are preliminary and upon coder review may  be revised to meet current compliance requirements. Christinia Gully, MD Tanda Rockers, MD 12/10/2015 8:47:51 AM This report has been signed electronically. Number of Addenda: 0 Scope In: 8:22:54 AM Scope Out: 8:33:12 AM

## 2015-12-13 LAB — CULTURE, RESPIRATORY W GRAM STAIN: Culture: NORMAL

## 2015-12-13 LAB — CULTURE, RESPIRATORY: SPECIAL REQUESTS: NORMAL

## 2015-12-14 LAB — CULTURE, RESPIRATORY W GRAM STAIN: Special Requests: NORMAL

## 2015-12-14 LAB — CULTURE, RESPIRATORY

## 2015-12-15 ENCOUNTER — Telehealth: Payer: Self-pay | Admitting: *Deleted

## 2015-12-15 ENCOUNTER — Telehealth: Payer: Self-pay | Admitting: Internal Medicine

## 2015-12-15 MED ORDER — LEVOFLOXACIN 500 MG PO TABS: 500.0000 mg | ORAL_TABLET | Freq: Every day | ORAL | Status: DC

## 2015-12-15 NOTE — Telephone Encounter (Signed)
-----   Message from Tanda Rockers, MD sent at 12/15/2015  3:31 PM EDT ----- levaquin 500 mg daily x 30 days

## 2015-12-15 NOTE — Telephone Encounter (Signed)
Patient cb wanting to know if portion of lung that is collapsed will eventually open back up and function again, [phone]

## 2015-12-15 NOTE — Telephone Encounter (Signed)
Called and spoke to pt. Pt states she already spoke with MW after initial message was sent at 12:16pm and concerns were addressed. Pt now questioning if her lungs will return to her "normal" or baseline after the procedure and after mucus plug removal.   Dr. Melvyn Novas please advise. Thanks.

## 2015-12-15 NOTE — Telephone Encounter (Signed)
Called and spoke to pt. Pt questioning what her next step is after the bronchoscopy. Pt states her SOB and cough has slightly worsened since the bronch and she is questioning if she should be doing something different. Pt states she is still doing her anti-reflux regime and the [facility] as prescribed. Pt also c/o chest heaviness but states this is not new.   Dr. Melvyn Novas please advise. Thanks.   Patient Instructions       Try dulera 100 Take 2 puffs first thing in am and then another 2 puffs about 12 hours later.   Work on inhaler technique:  relax and gently blow all the way out then take a nice smooth deep breath back in, triggering the inhaler at same time you start breathing in.  Hold for up to 5 seconds if you can. Blow out thru nose. Rinse and gargle with water when done   Keep head of bed elevated   Come to outpatient registration at [facility] (behind the ER) at 715 am Wednesday April 12th with nothing to eat or drink after midnight Tuesday for Bronchoscopy

## 2015-12-15 NOTE — Telephone Encounter (Signed)
Discussed with pt, prognosis depends on dx which we still lack

## 2015-12-15 NOTE — Telephone Encounter (Signed)
Pt returning call.Judy Ramos ° °

## 2015-12-15 NOTE — Telephone Encounter (Signed)
Rx sent and per MW, pt already aware

## 2015-12-15 NOTE — Telephone Encounter (Signed)
LMTCB

## 2015-12-16 ENCOUNTER — Encounter (HOSPITAL_COMMUNITY): Payer: Self-pay | Admitting: Internal Medicine

## 2015-12-17 ENCOUNTER — Telehealth: Payer: Self-pay | Admitting: Allergy and Immunology

## 2015-12-17 NOTE — Telephone Encounter (Signed)
I spoke with Judy Ramos to discuss her case further.  She informed me that Dr. Melvyn Novas found strep and the mucus culture from her bronchoscopy and was started on a course of levofloxacin 500 mg daily for 30 days.  She is interested in being referred on to a [place] such as [facility] or [facility].  I will call Dr. Melvyn Novas tomorrow to discuss further and find out if he still is interested in having the serum IgE level drawn.

## 2015-12-18 ENCOUNTER — Telehealth: Payer: Self-pay | Admitting: Allergy and Immunology

## 2015-12-18 NOTE — Telephone Encounter (Signed)
I spoke with Annye today to discuss with her the possibility of starting mepolizomab and a prolonged prednisone taper.  I have given her the option of this treatment plan or referral to a multispecialty center.  She is amenable to the mepolizomab with prednisone taper.  I will call Dr. Melvyn Novas to find out if you would prefer that  she has completed her current course of antibiotics prior to starting this therapy.

## 2015-12-22 ENCOUNTER — Other Ambulatory Visit: Payer: Self-pay

## 2015-12-22 ENCOUNTER — Telehealth: Payer: Self-pay | Admitting: Allergy and Immunology

## 2015-12-22 ENCOUNTER — Telehealth: Payer: Self-pay

## 2015-12-22 DIAGNOSIS — J4550 Severe persistent asthma, uncomplicated: Secondary | ICD-10-CM

## 2015-12-22 MED ORDER — PREDNISONE 10 MG PO TABS: ORAL_TABLET | ORAL | Status: DC

## 2015-12-22 NOTE — Telephone Encounter (Signed)
Pt went to pick up [facility] and it Cost $291.00. She is wondering is there something else she can be put on or a coupon.  Please Advise  Thanks   Bobbitt- 11/10/2015

## 2015-12-22 NOTE — Telephone Encounter (Signed)
Please advise 

## 2015-12-22 NOTE — Telephone Encounter (Signed)
Is there a decent coupon for [facility]? If so, send it to her please. If not, see if her insurance covers Breo 200/25, 1 inhalation daily. If not, maybe the coupon would help. Thanks.

## 2015-12-22 NOTE — Telephone Encounter (Signed)
I spoke with Ivin Booty. We will order total serum IgE level (per Dr. Gustavus Bryant request) at [facility] laboratory. She will start mepolizumab and a prolonged steroid taper (prednisone 40mg  daily x 7 days, 30 per day x 7 days, then 20 mg daily x 30 days). She will stay on Dulera 200, 2 inhalations via spacer device twice a day, for now. She will follow up with me 2 weeks after starting mepolizumab. I answered her questions and she agrees with this plan.

## 2015-12-23 ENCOUNTER — Other Ambulatory Visit: Payer: Self-pay | Admitting: Allergy and Immunology

## 2015-12-23 ENCOUNTER — Telehealth: Payer: Self-pay | Admitting: Allergy and Immunology

## 2015-12-23 DIAGNOSIS — J4550 Severe persistent asthma, uncomplicated: Secondary | ICD-10-CM | POA: Diagnosis not present

## 2015-12-23 NOTE — Telephone Encounter (Signed)
She had blood work ordered. Can she get a physical copy of the order to take by [facility] this afternoon to have the labs drawn? Can you please call her when ready? Thanks

## 2015-12-23 NOTE — Telephone Encounter (Signed)
Is the free trial coupon and $15 coupon for [facility]? If so, please inform the patient and provide her with those coupons. Thanks.

## 2015-12-23 NOTE — Telephone Encounter (Signed)
Strong work. Thanks.

## 2015-12-23 NOTE — Telephone Encounter (Signed)
YES BOTH ARE DULERA I WILL INFORM PT AND HAVE THEM WAITING UP FRONT FOR HER

## 2015-12-23 NOTE — Telephone Encounter (Signed)
There is a free trial offer coupon and a coupon that would make it around $15

## 2015-12-23 NOTE — Telephone Encounter (Signed)
Offer her to mail them. She is a Careers adviser patient.  Also, did her IgE lab get ordered appropriately and her extended prednisone taper get sent to the pharmacy?  Thanks.

## 2015-12-23 NOTE — Telephone Encounter (Signed)
HAVE PRINTED IT OUT AND WILL HAVE IT FOR HER

## 2015-12-23 NOTE — Telephone Encounter (Signed)
YES AND YES.

## 2015-12-24 LAB — IGE: IgE (Immunoglobulin E), Serum: 237 kU/L — ABNORMAL HIGH (ref <115)

## 2015-12-29 ENCOUNTER — Other Ambulatory Visit: Payer: Self-pay

## 2015-12-29 NOTE — Telephone Encounter (Signed)
Patient was calling to See if Dr. Verlin Fester wanted to be the one to refill her Synthroid. She use to go to [facility] and really doesn't want to go back.  Please Advise  Thanks

## 2015-12-29 NOTE — Telephone Encounter (Signed)
She will need a PCP to follow/treat her thyroid issues. If she prefers not to return to [facility], she will need a new PCP for general medical issues, including thyroid. Thanks.

## 2015-12-29 NOTE — Telephone Encounter (Signed)
Please advise 

## 2015-12-30 NOTE — Telephone Encounter (Signed)
Spoke with patient. Patient advised. MAH 

## 2016-01-02 DIAGNOSIS — J4550 Severe persistent asthma, uncomplicated: Secondary | ICD-10-CM | POA: Diagnosis not present

## 2016-01-05 DIAGNOSIS — E039 Hypothyroidism, unspecified: Secondary | ICD-10-CM | POA: Diagnosis not present

## 2016-01-05 DIAGNOSIS — M301 Polyarteritis with lung involvement [Churg-Strauss]: Secondary | ICD-10-CM

## 2016-01-05 DIAGNOSIS — J4520 Mild intermittent asthma, uncomplicated: Secondary | ICD-10-CM | POA: Diagnosis not present

## 2016-01-05 DIAGNOSIS — E785 Hyperlipidemia, unspecified: Secondary | ICD-10-CM | POA: Insufficient documentation

## 2016-01-05 DIAGNOSIS — J45909 Unspecified asthma, uncomplicated: Secondary | ICD-10-CM | POA: Insufficient documentation

## 2016-01-05 DIAGNOSIS — D7218 Eosinophilia in diseases classified elsewhere: Secondary | ICD-10-CM | POA: Insufficient documentation

## 2016-01-07 ENCOUNTER — Telehealth: Payer: Self-pay | Admitting: Allergy and Immunology

## 2016-01-07 NOTE — Telephone Encounter (Signed)
Pt called and said that she is on prednisone for 30 days or longer and that her vision is getting blurring and want to know what to do.

## 2016-01-07 NOTE — Telephone Encounter (Signed)
Please advise 

## 2016-01-07 NOTE — Telephone Encounter (Signed)
The patient.  She has just dropped the dose of prednisone from 30 mg to 20 mg.  She has no history of clot, but I recommended that she go and see an ophthalmologist or optometrist as soon as possible to have her intraocular pressure checked.  She will inform us of those results. Apparently, her mepolizomab is being sent to the [facility] office, however it be more convenient for her to receive the injection in the [facility] office.    ** Please arrange for the mepolizomab to be sent to the [facility] office.  Thanks.

## 2016-01-08 LAB — FUNGAL ORGANISM REFLEX

## 2016-01-08 LAB — FUNGUS CULTURE WITH STAIN

## 2016-01-08 LAB — FUNGUS CULTURE RESULT

## 2016-01-08 NOTE — Telephone Encounter (Signed)
Spoke with [name] who was trying to change shipping address to [facility].

## 2016-01-09 ENCOUNTER — Telehealth: Payer: Self-pay | Admitting: Allergy and Immunology

## 2016-01-09 DIAGNOSIS — H16213 Exposure keratoconjunctivitis, bilateral: Secondary | ICD-10-CM | POA: Diagnosis not present

## 2016-01-09 NOTE — Telephone Encounter (Signed)
Pt called to let dr Verlin Fester know that she went to the eye dr and he told her she has super dry eyes.

## 2016-01-09 NOTE — Telephone Encounter (Signed)
SPOKE WITH PT AND SHE WILL CONTINUE WITH COURSE OF THERAPHY

## 2016-01-09 NOTE — Telephone Encounter (Signed)
Continue prednisone as instructed, start Nucala when available, and use drop for dry eyes per ophthalmologist/optometrist recommendation. Thanks.

## 2016-01-16 ENCOUNTER — Ambulatory Visit (INDEPENDENT_AMBULATORY_CARE_PROVIDER_SITE_OTHER): Payer: BLUE CROSS/BLUE SHIELD

## 2016-01-16 DIAGNOSIS — J4550 Severe persistent asthma, uncomplicated: Secondary | ICD-10-CM | POA: Diagnosis not present

## 2016-01-16 MED ORDER — MEPOLIZUMAB 100 MG ~~LOC~~ SOLR
150.0000 mg | SUBCUTANEOUS | Status: DC
  Administered 2016-01-16 – 2016-11-22 (×5): 150 mg via SUBCUTANEOUS

## 2016-01-16 MED ORDER — EPINEPHRINE 0.3 MG/0.3ML IJ SOAJ: 0.3000 mg | Freq: Once | INTRAMUSCULAR | Status: DC

## 2016-01-16 NOTE — Telephone Encounter (Signed)
Started Nucala on 5/19 in [place] and will move to [place] after today.

## 2016-01-20 ENCOUNTER — Telehealth: Payer: Self-pay | Admitting: Allergy and Immunology

## 2016-01-20 NOTE — Telephone Encounter (Signed)
Please advise. Patient is still on 20 mg

## 2016-01-20 NOTE — Telephone Encounter (Signed)
May decrease to 15 mg daily (to be taken first thing in the morning). In addition have her try a Unisom tablet at bedtime +/- sublingual melatonin spray at bedtime and if she awakens in the middle of the night.

## 2016-01-20 NOTE — Telephone Encounter (Signed)
Requst to speak with Dr. Dennison Mascot nurse. She is on Prednisone. Since she has been taking it she has not been able to sleep well. She has tried multiple things to help but nothing has helped so far. She wants to know if you have any suggestions as to what may help her.

## 2016-01-20 NOTE — Telephone Encounter (Signed)
SPOKE WITH PATIENT ADVISED AS WRITTEN BELOW ADVISE IF NOT BETTER CALL BACK PATIENT VERBALIZED UNDERSTANDING

## 2016-01-20 NOTE — Telephone Encounter (Signed)
Left message to return call 

## 2016-01-27 ENCOUNTER — Telehealth: Payer: Self-pay | Admitting: Allergy and Immunology

## 2016-01-27 NOTE — Telephone Encounter (Signed)
Pt is on 15mg  once a day.   She is still having swelling around mouth, eye lids, and under chin. She said it started all of a sudden.

## 2016-01-27 NOTE — Telephone Encounter (Signed)
As Nucala was administered approximately 2 weeks ago, the swelling is more likely due to the prednisone.  Decrease prednisone to 10 mg daily.  Please ask her to inform us if her upper or lower respiratory symptoms increase after making this change.  Thanks.

## 2016-01-27 NOTE — Telephone Encounter (Signed)
She noticed this morning that she is having some swelling around her mouth and under her chin. She is not sure if it is from the [place] or Prednisone. She just wanted to inform you and wants to know if there is anything that she needs to do.

## 2016-01-27 NOTE — Telephone Encounter (Signed)
What dose of prednisone is she currently taking?

## 2016-01-29 NOTE — Telephone Encounter (Signed)
Patient agree that she believes that it could be due to the Prednisone. She will decrease to 10 mg and let us know if her symptoms start to flare. Patient has noticed slight congestion since dropping down to 15 mg and I told her to let use know if things get worse. She will give update in 2 weeks at next [facility] appointment if not sooner.

## 2016-01-30 DIAGNOSIS — J4550 Severe persistent asthma, uncomplicated: Secondary | ICD-10-CM | POA: Diagnosis not present

## 2016-02-02 DIAGNOSIS — Z8371 Family history of colonic polyps: Secondary | ICD-10-CM | POA: Insufficient documentation

## 2016-02-04 ENCOUNTER — Telehealth: Payer: Self-pay

## 2016-02-04 NOTE — Telephone Encounter (Signed)
Please have patient increase prednisone to 15mg  daily until she discusses with Dr. Verlin Fester when he returns to clinic.

## 2016-02-04 NOTE — Telephone Encounter (Signed)
Patient is now experiencing shortness of breath and increase congestion since Dr. Verlin Fester Decreased her prednisone to 10mg . She is wondering should she increase it back up.  Please Advise  Thanks

## 2016-02-04 NOTE — Telephone Encounter (Signed)
Spoke with pt she will be calling back next week to make an appointment with dr bobbit, will increase prednisone at this time to 15mg  daily per dr Lyndon Code recommendation.

## 2016-02-04 NOTE — Telephone Encounter (Signed)
Can you please advise since Dr Starling Manns is out of office.

## 2016-02-10 ENCOUNTER — Other Ambulatory Visit: Payer: Self-pay | Admitting: Allergy and Immunology

## 2016-02-12 ENCOUNTER — Ambulatory Visit (INDEPENDENT_AMBULATORY_CARE_PROVIDER_SITE_OTHER): Payer: BLUE CROSS/BLUE SHIELD | Admitting: *Deleted

## 2016-02-12 DIAGNOSIS — J4550 Severe persistent asthma, uncomplicated: Secondary | ICD-10-CM

## 2016-03-04 DIAGNOSIS — K648 Other hemorrhoids: Secondary | ICD-10-CM | POA: Diagnosis not present

## 2016-03-04 DIAGNOSIS — Z8371 Family history of colonic polyps: Secondary | ICD-10-CM | POA: Diagnosis not present

## 2016-03-04 DIAGNOSIS — Z1211 Encounter for screening for malignant neoplasm of colon: Secondary | ICD-10-CM | POA: Diagnosis not present

## 2016-03-05 DIAGNOSIS — J4550 Severe persistent asthma, uncomplicated: Secondary | ICD-10-CM | POA: Diagnosis not present

## 2016-03-11 ENCOUNTER — Ambulatory Visit (INDEPENDENT_AMBULATORY_CARE_PROVIDER_SITE_OTHER): Payer: BLUE CROSS/BLUE SHIELD | Admitting: *Deleted

## 2016-03-11 DIAGNOSIS — J4550 Severe persistent asthma, uncomplicated: Secondary | ICD-10-CM | POA: Diagnosis not present

## 2016-03-12 ENCOUNTER — Telehealth: Payer: Self-pay

## 2016-03-12 NOTE — Telephone Encounter (Signed)
Pt was wondering since she has had her 3rd Nucala injection if she can lower or come off her prednisone? Pt states that the nucala is making her feel better but she is not sure if it is the prednisone or the nucala. She states her brahting is doing better. She is having a lot of prednisone side effets like weight gain, sweating, fluid build up not being able to sleep at night.

## 2016-03-12 NOTE — Telephone Encounter (Signed)
She may drop down to 10 mg daily.  Thanks.

## 2016-03-12 NOTE — Telephone Encounter (Signed)
She is taking 15mg  daily

## 2016-03-12 NOTE — Telephone Encounter (Signed)
How much prednisone is she currently taking?

## 2016-03-12 NOTE — Telephone Encounter (Signed)
Informed pt to cut down the prednisone to 10mg 

## 2016-03-17 ENCOUNTER — Other Ambulatory Visit: Payer: Self-pay | Admitting: Allergy

## 2016-03-17 DIAGNOSIS — D2339 Other benign neoplasm of skin of other parts of face: Secondary | ICD-10-CM | POA: Diagnosis not present

## 2016-03-17 MED ORDER — PREDNISONE 10 MG PO TABS: ORAL_TABLET | ORAL | Status: DC

## 2016-03-17 NOTE — Telephone Encounter (Signed)
PT. IS REQUESTING REFILL FOR PREDNISONE 10MG (60) PHARMACY IS [facility]. IS IT OK TO REFILL PRESCRIPTION?

## 2016-03-17 NOTE — Telephone Encounter (Signed)
Prednisone sent in to pharmacy. Left message informing patient refill sent in

## 2016-03-17 NOTE — Telephone Encounter (Signed)
Yes, please refill

## 2016-04-08 ENCOUNTER — Ambulatory Visit (INDEPENDENT_AMBULATORY_CARE_PROVIDER_SITE_OTHER): Payer: BLUE CROSS/BLUE SHIELD

## 2016-04-08 DIAGNOSIS — J4550 Severe persistent asthma, uncomplicated: Secondary | ICD-10-CM | POA: Diagnosis not present

## 2016-04-15 ENCOUNTER — Telehealth: Payer: Self-pay | Admitting: Allergy and Immunology

## 2016-04-15 NOTE — Telephone Encounter (Signed)
Pt is on Prednisone & Nucala injections and she wants to know if there is a way to check if the esinophils are down or not. She is starting to feel more congested with the Prednisone and wants to know if she can get off of it. Please call pt back soon as possible.

## 2016-04-15 NOTE — Telephone Encounter (Signed)
Spoke to patient and we discussed to make an appointment to talk with Dr. Verlin Fester regarding her eosinophil count to see if her eosinophil count has come down. Dr.Bobbitt stated she can increase her prednisone to 15mg  per day to help with her nasal congestion.

## 2016-04-17 ENCOUNTER — Other Ambulatory Visit: Payer: Self-pay | Admitting: Allergy and Immunology

## 2016-04-26 ENCOUNTER — Encounter: Payer: Self-pay | Admitting: Allergy and Immunology

## 2016-04-26 ENCOUNTER — Ambulatory Visit (INDEPENDENT_AMBULATORY_CARE_PROVIDER_SITE_OTHER): Payer: BLUE CROSS/BLUE SHIELD | Admitting: Allergy and Immunology

## 2016-04-26 VITALS — BP 110/70 | HR 80 | Temp 98.3°F | Resp 16

## 2016-04-26 DIAGNOSIS — J4540 Moderate persistent asthma, uncomplicated: Secondary | ICD-10-CM

## 2016-04-26 DIAGNOSIS — D721 Eosinophilia, unspecified: Secondary | ICD-10-CM

## 2016-04-26 DIAGNOSIS — J310 Chronic rhinitis: Secondary | ICD-10-CM | POA: Diagnosis not present

## 2016-04-26 MED ORDER — PREDNISONE 5 MG PO TABS: ORAL_TABLET | ORAL | 3 refills | Status: DC

## 2016-04-26 NOTE — Assessment & Plan Note (Signed)
   Continue fluticasone nasal spray and/or nasal saline irrigation as needed.

## 2016-04-26 NOTE — Assessment & Plan Note (Signed)
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   Continue new college injections every 4 weeks as prescribed and as tolerated.  Continue slow/gradual prednisone taper.  Judy Ramos's PCP will order dexa-scan and follow, for now continue calcium/vit D supplementation.

## 2016-04-26 NOTE — Patient Instructions (Signed)
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   Continue new college injections every 4 weeks as prescribed and as tolerated.  Continue slow/gradual prednisone taper.  Judy Ramos's PCP will order dexa-scan and follow, for now continue calcium/vit D supplementation.  Moderate persistent asthma Subjective and objective improvement on the current treatment plan.  For now, continue Nucala, Dulera 100/5 g, 2 inhalations via spacer device twice a day, and albuterol every 4-6 hours as needed.  Chronic rhinitis  Continue fluticasone nasal spray and/or nasal saline irrigation as needed.   Return in about 4 months (around 08/26/2016), or if symptoms worsen or fail to improve.

## 2016-04-26 NOTE — Assessment & Plan Note (Addendum)
Subjective and objective improvement on the current treatment plan.  For now, continue Nucala, Dulera 100/5 g, 2 inhalations via spacer device twice a day, and albuterol every 4-6 hours as needed.

## 2016-04-26 NOTE — Progress Notes (Signed)
Follow-up Note  RE: Judy Ramos MRN: [id number] DOB: 1961/01/20 Date of Office Visit: 04/26/2016  Primary care provider: Chesley Mires, MD Referring provider: Chesley Mires, MD  History of present illness: Judy Ramos is a 55 y.o. female with presumed [place]-Strauss on Nucala. She is most recently seen in this clinic for her initial evaluation on 11/10/2015. Her symptoms have improved dramatically since starting Nucala. She was able to taper the prednisone to 10 mg daily for a few weeks but due to some return of symptoms, increased to the dose to 15 mg daily with adequate symptom control. She has had 4 Nucala injections without complications.   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   Continue new college injections every 4 weeks as prescribed and as tolerated.  Continue slow/gradual prednisone taper.  Judy Ramos's PCP will order dexa-scan and follow, for now continue calcium/vit D supplementation.  Moderate persistent asthma Subjective and objective improvement on the current treatment plan.  For now, continue Nucala, Dulera 100/5 g, 2 inhalations via spacer device twice a day, and albuterol every 4-6 hours as needed.  Chronic rhinitis  Continue fluticasone nasal spray and/or nasal saline irrigation as needed.   Meds ordered this encounter  Medications  . predniSONE (DELTASONE) 5 MG tablet    Sig: Use as directed    Dispense:  30 tablet    Refill:  3    Diagnositics: Spirometry:  Normal with an FEV1 of 111% predicted.  Please see scanned spirometry results for details.    Physical examination: Blood pressure 110/70, pulse 80, temperature 98.3 F (36.8 C), temperature source Oral, resp. rate 16.  General: Alert, interactive, in no acute distress. HEENT: TMs pearly gray, turbinates mildly edematous without discharge, post-pharynx moderately erythematous. Neck: Supple without lymphadenopathy. Lungs: Clear to  auscultation without wheezing, rhonchi or rales. CV: Normal S1, S2 without murmurs. Skin: Warm and dry, without lesions or rashes.  The following portions of the patient's history were reviewed and updated as appropriate: allergies, current medications, past family history, past medical history, past social history, past surgical history and problem list.    Medication List       Accurate as of 04/26/16  6:16 PM. Always use your most recent med list.          acetaminophen-codeine 300-30 MG tablet Commonly known as:  TYLENOL #3 One every 4 hours as needed for cough   albuterol 108 (90 Base) MCG/ACT inhaler Commonly known as:  PROVENTIL HFA;VENTOLIN HFA Inhale 2 puffs into the lungs every 6 (six) hours as needed for wheezing or shortness of breath.   Azelastine HCl 0.15 % Soln Place 2 sprays into both nostrils 2 (two) times daily as needed.   Biotin 10000 MCG Tabs Take by mouth.   BLISOVI 24 FE 1-20 MG-MCG(24) tablet Generic drug:  Norethindrone Acetate-Ethinyl Estrad-FE Take 1 tablet by mouth daily.   CALCIUM CITRATE + D3 PO Take by mouth.   EPINEPHrine 0.3 mg/0.3 mL Soaj injection Commonly known as:  EPIPEN 2-PAK Inject 0.3 mLs (0.3 mg total) into the muscle once.   fluticasone 50 MCG/ACT nasal spray Commonly known as:  FLONASE Place 2 sprays into both nostrils daily. Reported on 11/10/2015   levothyroxine 50 MCG tablet Commonly known as:  SYNTHROID, LEVOTHROID TAKE ONE TABLET BY MOUTH ONCE DAILY   Magnesium 500 MG Tabs Take by mouth.   mometasone-formoterol 100-5 MCG/ACT Aero Commonly known as:  DULERA Inhale 2 puffs into the lungs 2 (two) times daily.  NUCALA 100 MG Solr Generic drug:  Mepolizumab Inject into the skin.   pravastatin 40 MG tablet Commonly known as:  PRAVACHOL Take 1 tablet (40 mg total) by mouth daily.   predniSONE 5 MG tablet Commonly known as:  DELTASONE Use as directed       Allergies  Allergen Reactions  . Amoxil  [Amoxicillin]   . Penicillins Rash    I appreciate the opportunity to take part in [name]'s care. Please do not hesitate to contact me with questions.  Sincerely,   R. Edgar Frisk, MD

## 2016-05-11 DIAGNOSIS — J4550 Severe persistent asthma, uncomplicated: Secondary | ICD-10-CM | POA: Diagnosis not present

## 2016-05-17 ENCOUNTER — Ambulatory Visit (INDEPENDENT_AMBULATORY_CARE_PROVIDER_SITE_OTHER): Payer: BLUE CROSS/BLUE SHIELD

## 2016-05-17 DIAGNOSIS — J4540 Moderate persistent asthma, uncomplicated: Secondary | ICD-10-CM | POA: Diagnosis not present

## 2016-05-24 ENCOUNTER — Telehealth: Payer: Self-pay | Admitting: *Deleted

## 2016-05-24 NOTE — Telephone Encounter (Signed)
PT IS WONDERING IF IT IS SAFE FOR HER TO RECEIVE THE FLU SHOT.

## 2016-05-24 NOTE — Telephone Encounter (Signed)
Yes, if she hasn't had problems in the past with it. Thanks.

## 2016-05-24 NOTE — Telephone Encounter (Signed)
Informed patient it is fine for her to receive flu shot if never had problems.

## 2016-05-27 ENCOUNTER — Other Ambulatory Visit: Payer: Self-pay | Admitting: Allergy

## 2016-05-27 MED ORDER — MOMETASONE FURO-FORMOTEROL FUM 100-5 MCG/ACT IN AERO: 2.0000 | INHALATION_SPRAY | Freq: Two times a day (BID) | RESPIRATORY_TRACT | 2 refills | Status: DC

## 2016-05-28 ENCOUNTER — Telehealth: Payer: Self-pay

## 2016-05-28 NOTE — Telephone Encounter (Signed)
[facility] dulera 273mcg/5mcg. Pt.'s med list states she's taking dulera 124mcg/5mcg. Called pt. To ask which strength she was taking and she didn't know will call us back on Monday to let us know which strength she is taking. Pt. States she has plenty of dulera.

## 2016-05-31 ENCOUNTER — Telehealth: Payer: Self-pay | Admitting: Allergy

## 2016-05-31 ENCOUNTER — Other Ambulatory Visit: Payer: Self-pay | Admitting: Allergy

## 2016-05-31 ENCOUNTER — Telehealth: Payer: Self-pay | Admitting: Allergy and Immunology

## 2016-05-31 MED ORDER — MOMETASONE FURO-FORMOTEROL FUM 200-5 MCG/ACT IN AERO: 2.0000 | INHALATION_SPRAY | Freq: Two times a day (BID) | RESPIRATORY_TRACT | 2 refills | Status: DC

## 2016-05-31 NOTE — Telephone Encounter (Signed)
Talked with patient. Said she was on Dulera 200 mcg/74mcg. Two puffs twice a day. [facility]

## 2016-05-31 NOTE — Telephone Encounter (Signed)
Pt was suppose to call and give a update for how she is doing. She still having some problems with her hands and with bruise.please call pt at [phone].

## 2016-06-01 ENCOUNTER — Encounter: Payer: Self-pay | Admitting: Allergy and Immunology

## 2016-06-01 DIAGNOSIS — Z7952 Long term (current) use of systemic steroids: Secondary | ICD-10-CM | POA: Diagnosis not present

## 2016-06-01 DIAGNOSIS — Z13820 Encounter for screening for osteoporosis: Secondary | ICD-10-CM | POA: Diagnosis not present

## 2016-06-01 NOTE — Telephone Encounter (Signed)
I tried to call her but no answer.  I left a message asking her to call me back during office hours at her convenience tomorrow or Thursday.

## 2016-06-03 ENCOUNTER — Telehealth: Payer: Self-pay | Admitting: Allergy and Immunology

## 2016-06-03 NOTE — Telephone Encounter (Signed)
Please call pt back regarding her hands cramping and body bruising that she is experiencing and she thinks it may be due to her meds that were prescribed by Dr. Verlin Fester. She is asking for any suggestions.

## 2016-06-03 NOTE — Telephone Encounter (Signed)
Hand cramping for 1 month. This cramping is 1-2 times a week, and bruising more easily. Her nucala injections are monthly. Started this in April or may. She was 10 days late on last injection. Patient wanted to inform you of these syptoms, she doesn't know if it is a side effect from a medication. She is taking all meds that are listed in chart. She stated that she has sent you a Mychart message.

## 2016-06-03 NOTE — Telephone Encounter (Signed)
I had a lengthy discussion with the patient regarding the side effects that she is experiencing.  Muscle spasm is a potential side effect of mepolizomab.  We discussed the possibility of switching to another biologic agent, such as Sinquair.  However, she has done very well with Nucala regarding the sinus and pulmonary issues.  She has met her insurance deductible for the year and is interested in evaluation at a [employer].  She will most likely get an appointment with [facility] allergy department.  In the meantime, she will alter her diet to make sure that she is getting plenty of electrolytes.

## 2016-06-07 ENCOUNTER — Encounter: Payer: Self-pay | Admitting: Allergy and Immunology

## 2016-06-08 ENCOUNTER — Telehealth: Payer: Self-pay | Admitting: *Deleted

## 2016-06-08 DIAGNOSIS — J4550 Severe persistent asthma, uncomplicated: Secondary | ICD-10-CM | POA: Diagnosis not present

## 2016-06-08 NOTE — Telephone Encounter (Signed)
Made in error

## 2016-06-14 ENCOUNTER — Ambulatory Visit: Payer: BLUE CROSS/BLUE SHIELD

## 2016-06-14 ENCOUNTER — Ambulatory Visit (INDEPENDENT_AMBULATORY_CARE_PROVIDER_SITE_OTHER): Payer: BLUE CROSS/BLUE SHIELD

## 2016-06-14 DIAGNOSIS — J4540 Moderate persistent asthma, uncomplicated: Secondary | ICD-10-CM

## 2016-06-15 DIAGNOSIS — Z7952 Long term (current) use of systemic steroids: Secondary | ICD-10-CM | POA: Diagnosis not present

## 2016-06-15 DIAGNOSIS — J4550 Severe persistent asthma, uncomplicated: Secondary | ICD-10-CM | POA: Diagnosis not present

## 2016-06-15 DIAGNOSIS — Z7951 Long term (current) use of inhaled steroids: Secondary | ICD-10-CM | POA: Diagnosis not present

## 2016-06-18 ENCOUNTER — Telehealth: Payer: Self-pay

## 2016-06-18 ENCOUNTER — Encounter: Payer: Self-pay | Admitting: Allergy and Immunology

## 2016-06-18 NOTE — Telephone Encounter (Signed)
Message from pt  I went to [facility] on Tuesday and they said that they felt that I do not have Erick Alley but more than likely Adult Onset severe asthma. Dr. Marcelline Deist recommended that I go down from 12.5 mgs of Prednisone to 10 mgs, which I have.  He also recommended that I see a rheumatologist. Can you recommend a rheumatologist? I need a referral. Dr. Marcelline Deist referred me to [facility] but the first appointment is not until Feb 2nd.

## 2016-06-21 NOTE — Telephone Encounter (Signed)
Please put in a referral to rheumatology. Thanks.

## 2016-06-21 NOTE — Telephone Encounter (Signed)
Please refer

## 2016-06-22 ENCOUNTER — Encounter: Payer: Self-pay | Admitting: Allergy and Immunology

## 2016-06-22 ENCOUNTER — Telehealth: Payer: Self-pay

## 2016-06-22 NOTE — Telephone Encounter (Signed)
Noted. Thanks.

## 2016-06-22 NOTE — Telephone Encounter (Signed)
Informed patient will refer to a rheumatologist.

## 2016-06-22 NOTE — Telephone Encounter (Signed)
Message from pt   Hello - at my visit to [place], Dr. Marcelline Deist and his assistant doctor concluded that I may just have Adult onset Asthma. That not all my test results fell into place for Churg Struass Syndrome. He also recommended that I see a rheumatologist. He referred me to [facility] and the first appointment is not until February 2nd. Do you have a rheumatologist that you would recommend?     Thanks,  Judy Ramos     Please advise

## 2016-06-22 NOTE — Telephone Encounter (Signed)
Sent request to [facility] to make referral.

## 2016-06-22 NOTE — Telephone Encounter (Signed)
I responded to this yesterday. Please make a referral to a rheumatologist. Thanks.

## 2016-06-23 NOTE — Telephone Encounter (Signed)
I have sent a message to the patient to let her know I sent a referral to [facility] and they will contact her within 1 to 2 business days.

## 2016-07-01 ENCOUNTER — Encounter: Payer: Self-pay | Admitting: Allergy and Immunology

## 2016-07-06 DIAGNOSIS — J4550 Severe persistent asthma, uncomplicated: Secondary | ICD-10-CM | POA: Diagnosis not present

## 2016-07-12 ENCOUNTER — Encounter: Payer: Self-pay | Admitting: Allergy and Immunology

## 2016-07-12 ENCOUNTER — Ambulatory Visit (INDEPENDENT_AMBULATORY_CARE_PROVIDER_SITE_OTHER): Payer: BLUE CROSS/BLUE SHIELD

## 2016-07-12 DIAGNOSIS — J4550 Severe persistent asthma, uncomplicated: Secondary | ICD-10-CM | POA: Diagnosis not present

## 2016-07-14 DIAGNOSIS — R5383 Other fatigue: Secondary | ICD-10-CM | POA: Diagnosis not present

## 2016-07-14 DIAGNOSIS — M301 Polyarteritis with lung involvement [Churg-Strauss]: Secondary | ICD-10-CM | POA: Diagnosis not present

## 2016-07-14 DIAGNOSIS — R0602 Shortness of breath: Secondary | ICD-10-CM | POA: Diagnosis not present

## 2016-07-14 DIAGNOSIS — R0981 Nasal congestion: Secondary | ICD-10-CM | POA: Diagnosis not present

## 2016-07-14 NOTE — Telephone Encounter (Signed)
Will notify Dr. Verlin Fester.  Thank you.

## 2016-07-20 ENCOUNTER — Telehealth: Payer: Self-pay | Admitting: Allergy and Immunology

## 2016-07-29 ENCOUNTER — Telehealth: Payer: Self-pay | Admitting: *Deleted

## 2016-07-29 DIAGNOSIS — R0981 Nasal congestion: Secondary | ICD-10-CM | POA: Diagnosis not present

## 2016-07-29 DIAGNOSIS — M301 Polyarteritis with lung involvement [Churg-Strauss]: Secondary | ICD-10-CM | POA: Diagnosis not present

## 2016-07-29 DIAGNOSIS — R0602 Shortness of breath: Secondary | ICD-10-CM | POA: Diagnosis not present

## 2016-07-29 DIAGNOSIS — M150 Primary generalized (osteo)arthritis: Secondary | ICD-10-CM | POA: Diagnosis not present

## 2016-07-29 NOTE — Telephone Encounter (Signed)
Patient called and advised that her Rheumatologist want her to stop Nucala and start another biologic to treat her vasculitis.  I told her I would D/C herr rx for [facility]

## 2016-07-29 NOTE — Telephone Encounter (Signed)
Noted. Thanks.

## 2016-07-30 DIAGNOSIS — H16213 Exposure keratoconjunctivitis, bilateral: Secondary | ICD-10-CM | POA: Diagnosis not present

## 2016-08-03 ENCOUNTER — Telehealth: Payer: Self-pay | Admitting: Family Medicine

## 2016-08-03 NOTE — Telephone Encounter (Signed)
Fine with me-- I have not seen her in 2 years

## 2016-08-03 NOTE — Telephone Encounter (Signed)
Patient would like to transfer care from Dr. Carollee Herter to Dr. Lorelei Pont.

## 2016-08-03 NOTE — Telephone Encounter (Signed)
ok 

## 2016-08-05 DIAGNOSIS — M301 Polyarteritis with lung involvement [Churg-Strauss]: Secondary | ICD-10-CM | POA: Diagnosis not present

## 2016-08-09 ENCOUNTER — Ambulatory Visit: Payer: BLUE CROSS/BLUE SHIELD

## 2016-08-12 ENCOUNTER — Ambulatory Visit: Payer: BLUE CROSS/BLUE SHIELD | Admitting: Allergy and Immunology

## 2016-08-12 DIAGNOSIS — M301 Polyarteritis with lung involvement [Churg-Strauss]: Secondary | ICD-10-CM | POA: Diagnosis not present

## 2016-08-19 ENCOUNTER — Ambulatory Visit (INDEPENDENT_AMBULATORY_CARE_PROVIDER_SITE_OTHER): Payer: BLUE CROSS/BLUE SHIELD | Admitting: Allergy and Immunology

## 2016-08-19 ENCOUNTER — Encounter: Payer: Self-pay | Admitting: Allergy and Immunology

## 2016-08-19 VITALS — BP 98/64 | HR 80 | Temp 98.4°F | Resp 12

## 2016-08-19 DIAGNOSIS — D721 Eosinophilia, unspecified: Secondary | ICD-10-CM

## 2016-08-19 DIAGNOSIS — J310 Chronic rhinitis: Secondary | ICD-10-CM | POA: Diagnosis not present

## 2016-08-19 DIAGNOSIS — J4540 Moderate persistent asthma, uncomplicated: Secondary | ICD-10-CM | POA: Diagnosis not present

## 2016-08-19 NOTE — Assessment & Plan Note (Addendum)
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   Continue azathioprine as prescribed.  Continue prednisone, tapering to the lowest effective dose.  Follow up with rheumatologist as directed.  Edin's PCP will follow bone density, for now continue calcium/vit D supplementation.

## 2016-08-19 NOTE — Patient Instructions (Addendum)
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   Continue azathioprine as prescribed.  Continue prednisone, tapering to the lowest effective dose.  Follow up with rheumatologist as directed.  Kacie's PCP will follow bone density, for now continue calcium/vit D supplementation.  Moderate persistent asthma  For now, continue Dulera, 2 inhalations via spacer device twice a day, and albuterol HFA, 1-2 inhalations every 4-6 hours as needed.  If subjective and objective measures of pulmonary function remain stable, we will consider stepping down therapy on the next visit.  Chronic rhinitis  Continue fluticasone nasal spray as needed and nasal saline irrigation as needed.   Return in about 4 months (around 12/18/2016), or if symptoms worsen or fail to improve.

## 2016-08-19 NOTE — Assessment & Plan Note (Signed)
   Continue fluticasone nasal spray as needed and nasal saline irrigation as needed.

## 2016-08-19 NOTE — Assessment & Plan Note (Signed)
   For now, continue Dulera, 2 inhalations via spacer device twice a day, and albuterol HFA, 1-2 inhalations every 4-6 hours as needed.  If subjective and objective measures of pulmonary function remain stable, we will consider stepping down therapy on the next visit.

## 2016-08-19 NOTE — Progress Notes (Signed)
Follow-up Note  RE: Judy Ramos MRN: [id number] DOB: 03-04-1961 Date of Office Visit: 08/19/2016  Primary care provider: Chesley Mires, MD Referring provider: Chesley Mires, MD  History of present illness: Judy Ramos is a 55 y.o. female with presumed eosinophilic granulomatosis with polyangiitis, moderate persistent asthma, and chronic rhinitis presenting today for follow up.  She reports that her symptoms had been relatively well-controlled with mepolizomab and prednisone 10-15 mg daily.  She was unable to taper below prednisone 10 mg daily without recurrence of symptoms.  Mepolizomab was discontinued and she was started on azathioprine 3 weeks ago by her rheumatologist, Dr. Gavin Pound.  Initially she started at 50 mg azathioprine and was increased to 100 mg.  She states that yesterday she had to increase the prednisone dose to 20 mg daily the other day in order to control symptoms. She will follow up with Dr. Trudie Reed on January 3rd.   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   Continue azathioprine as prescribed.  Continue prednisone, tapering to the lowest effective dose.  Follow up with rheumatologist as directed.  Luretta's PCP will follow bone density, for now continue calcium/vit D supplementation.  Moderate persistent asthma  For now, continue Dulera, 2 inhalations via spacer device twice a day, and albuterol HFA, 1-2 inhalations every 4-6 hours as needed.  If subjective and objective measures of pulmonary function remain stable, we will consider stepping down therapy on the next visit.  Chronic rhinitis  Continue fluticasone nasal spray as needed and nasal saline irrigation as needed.   Diagnostics: Spirometry:  Normal with an FEV1 of 97% predicted.  Please see scanned spirometry results for details.    Physical examination: Blood pressure 98/64, pulse 80, temperature 98.4 F (36.9 C), temperature source  Oral, resp. rate 12.  General: Alert, interactive, in no acute distress. HEENT: TMs pearly gray, turbinates mildly edematous without discharge, post-pharynx unremarkable. Neck: Supple without lymphadenopathy. Lungs: Clear to auscultation without wheezing, rhonchi or rales. CV: Normal S1, S2 without murmurs. Skin: Warm and dry, without lesions or rashes.  The following portions of the patient's history were reviewed and updated as appropriate: allergies, current medications, past family history, past medical history, past social history, past surgical history and problem list.  Allergies as of 08/19/2016      Reactions   Amoxil [amoxicillin]    Penicillins Rash      Medication List       Accurate as of 08/19/16  7:36 PM. Always use your most recent med list.          acetaminophen-codeine 300-30 MG tablet Commonly known as:  TYLENOL #3 One every 4 hours as needed for cough   albuterol 108 (90 Base) MCG/ACT inhaler Commonly known as:  PROVENTIL HFA;VENTOLIN HFA Inhale into the lungs.   azaTHIOprine 50 MG tablet Commonly known as:  IMURAN   Azelastine HCl 0.15 % Soln Place 2 sprays into both nostrils 2 (two) times daily as needed.   Biotin 10000 MCG Tabs Take by mouth.   BLISOVI 24 FE 1-20 MG-MCG(24) tablet Generic drug:  Norethindrone Acetate-Ethinyl Estrad-FE Take 1 tablet by mouth daily.   Norethindrone Acetate-Ethinyl Estrad-FE 1-20 MG-MCG(24) tablet Commonly known as:  LOESTRIN 24 FE Take by mouth.   fluticasone 50 MCG/ACT nasal spray Commonly known as:  FLONASE Place 2 sprays into both nostrils daily. Reported on 11/10/2015   levofloxacin 750 MG tablet Commonly known as:  LEVAQUIN   levothyroxine 50 MCG tablet Commonly known as:  SYNTHROID,  LEVOTHROID Take by mouth.   Magnesium 500 MG Tabs Take by mouth.   mometasone-formoterol 100-5 MCG/ACT Aero Commonly known as:  DULERA Inhale 2 puffs into the lungs 2 (two) times daily.   mometasone-formoterol  200-5 MCG/ACT Aero Commonly known as:  DULERA Inhale 2 puffs into the lungs 2 (two) times daily.   OSCAL 500/200 D-3 500-200 MG-UNIT tablet Generic drug:  calcium-vitamin D Take 1 tablet by mouth.   pravastatin 40 MG tablet Commonly known as:  PRAVACHOL TAKE 1 TABLET DAILY.   predniSONE 20 MG tablet Commonly known as:  DELTASONE Take 20 mg by mouth.       Allergies  Allergen Reactions  . Amoxil [Amoxicillin]   . Penicillins Rash    I appreciate the opportunity to take part in Ariele's care. Please do not hesitate to contact me with questions.  Sincerely,   R. Edgar Frisk, MD

## 2016-08-20 ENCOUNTER — Other Ambulatory Visit: Payer: Self-pay | Admitting: *Deleted

## 2016-08-20 MED ORDER — ALBUTEROL SULFATE HFA 108 (90 BASE) MCG/ACT IN AERS: 2.0000 | INHALATION_SPRAY | RESPIRATORY_TRACT | 3 refills | Status: DC | PRN

## 2016-09-01 ENCOUNTER — Telehealth: Payer: Self-pay | Admitting: Behavioral Health

## 2016-09-01 ENCOUNTER — Telehealth: Payer: Self-pay | Admitting: Allergy

## 2016-09-01 DIAGNOSIS — M301 Polyarteritis with lung involvement [Churg-Strauss]: Secondary | ICD-10-CM | POA: Diagnosis not present

## 2016-09-01 DIAGNOSIS — R0981 Nasal congestion: Secondary | ICD-10-CM | POA: Diagnosis not present

## 2016-09-01 DIAGNOSIS — M818 Other osteoporosis without current pathological fracture: Secondary | ICD-10-CM | POA: Diagnosis not present

## 2016-09-01 DIAGNOSIS — M150 Primary generalized (osteo)arthritis: Secondary | ICD-10-CM | POA: Diagnosis not present

## 2016-09-01 NOTE — Telephone Encounter (Signed)
Patient called.Put Nucala  on hold in December. Said there was something different she could start. You spoke with  Dr. Trudie Reed this week. Patient wants to go ahead and start what you recommend .Patient would like for you to call her. Phone number is [phone].

## 2016-09-01 NOTE — Telephone Encounter (Signed)
Unable to reach patient at time of Pre-Visit Call.  Left message for patient to return call when available.    

## 2016-09-01 NOTE — Telephone Encounter (Signed)
Please submit paperwork to initiate approval for benralizumab. I spoke with the patient this evening and her rheumatologist earlier today.  The plan will be to restart Imuran and, if she has no side effects to Imuran, give benralizumab concomitantly.  With accommodation, if she is able to discontinue prednisone, we will make a decision regarding dropping one medication and continuing the other.

## 2016-09-02 ENCOUNTER — Ambulatory Visit (INDEPENDENT_AMBULATORY_CARE_PROVIDER_SITE_OTHER): Payer: BLUE CROSS/BLUE SHIELD | Admitting: Family Medicine

## 2016-09-02 ENCOUNTER — Encounter: Payer: Self-pay | Admitting: Family Medicine

## 2016-09-02 VITALS — BP 118/72 | HR 84 | Temp 98.0°F | Ht 66.0 in | Wt 142.4 lb

## 2016-09-02 DIAGNOSIS — M810 Age-related osteoporosis without current pathological fracture: Secondary | ICD-10-CM | POA: Insufficient documentation

## 2016-09-02 DIAGNOSIS — E039 Hypothyroidism, unspecified: Secondary | ICD-10-CM

## 2016-09-02 DIAGNOSIS — M301 Polyarteritis with lung involvement [Churg-Strauss]: Secondary | ICD-10-CM | POA: Diagnosis not present

## 2016-09-02 DIAGNOSIS — M818 Other osteoporosis without current pathological fracture: Secondary | ICD-10-CM

## 2016-09-02 DIAGNOSIS — E782 Mixed hyperlipidemia: Secondary | ICD-10-CM | POA: Diagnosis not present

## 2016-09-02 LAB — LIPID PANEL
CHOLESTEROL: 251 mg/dL — AB (ref 0–200)
HDL: 61.2 mg/dL (ref >39.00)
LDL Cholesterol: 172 mg/dL — ABNORMAL HIGH (ref 0–99)
NONHDL: 190.09
Total CHOL/HDL Ratio: 4
Triglycerides: 91 mg/dL (ref 0.0–149.0)
VLDL: 18.2 mg/dL (ref 0.0–40.0)

## 2016-09-02 NOTE — Progress Notes (Signed)
Pre visit review using our clinic review tool, if applicable. No additional management support is needed unless otherwise documented below in the visit note. 

## 2016-09-02 NOTE — Progress Notes (Addendum)
Judy Ramos at [facility] [address], [place], [place] [address] [phone]  Date:  09/02/2016   Name:  Judy Ramos   DOB:  May 04, 1961   MRN:  [id number]  PCP:  Chesley Mires, MD    Chief Complaint: Establish Care (Pt here to est care. Transferring from Dr. Etter Sjogren. )   History of Present Illness:  Judy Ramos is a 56 y.o. very pleasant female patient who presents with the following:  She is here today to establish care with [place] She sees Dr. Trudie Reed at [facility] for her autoimmune disorder- Erick Alley syndrome- she is on prednisone 20 mg daily now.  She was able to taper down from a higher dose.   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  It took a while to figure out what was wrong  She also has asthma and allergies- she is managed by Dr. 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  She is on synthroid and pravachol for her cholesterol- however she has been out of her cholesterol medication for about one month  She is not fasting right now.  However had a light soup for lunch so we should be able to go ahead with a cholesterol check She has 2 sons- 5 and 67 yo, and is married She works 2 part time jobs She enjoys walking, tennis for exercise.  Her lungs do not generally hold her back from exercise No fever, chills, GI upset  Lab Results  Component Value Date   TSH 0.58 10/03/2013    Lab Results  Component Value Date   CHOL 187 10/03/2013   CHOL 168 11/24/2012   CHOL 198 05/17/2011   Lab Results  Component Value Date   HDL 63.20 10/03/2013   HDL 65.20 11/24/2012   HDL 67.00 05/17/2011   Lab Results  Component Value Date   LDLCALC 100 (H) 10/03/2013   LDLCALC 92 11/24/2012   LDLCALC 119  (H) 05/17/2011   Lab Results  Component Value Date   TRIG 117.0 10/03/2013   TRIG 56.0 11/24/2012   TRIG 61.0 05/17/2011   Lab Results  Component Value Date   CHOLHDL 3 10/03/2013   CHOLHDL 3 11/24/2012   CHOLHDL 3 05/17/2011   Lab Results  Component Value Date   LDLDIRECT 162.6 09/30/2006      Patient Active Problem List   Diagnosis Date Noted  . Eosinophilia 04/26/2016  . Chronic rhinitis 04/26/2016  . Moderate persistent asthma 11/13/2015  . Atelectasis RUL 11/13/2015  . Perennial allergic rhinitis with a nonallergic component 11/10/2015  . Persistent cough 11/10/2015  . Esophageal reflux 11/10/2015  . Chronic maxillary sinusitis 11/10/2015  . History of asthma 11/10/2015  . Acute sinusitis treated with antibiotics in the past 60 days 10/22/2013  . Rash 12/23/2010  . IRREGULAR MENSES 01/20/2010  . VARICOSE VEINS LOWER EXTREMITIES W/INFLAMMATION 10/16/2009  . CERVICAL POLYP 10/16/2009  . INSOMNIA 09/05/2008  . HYPOTHYROIDISM 10/23/2007  . THYROID NODULE 02/08/2007    Past Medical History:  Diagnosis Date  . Gallstone   . Hypothyroidism   . Thyroid nodule     Past Surgical History:  Procedure Laterality Date  . BREAST ENHANCEMENT SURGERY  2001  . CESAREAN SECTION  x 2  . CHOLECYSTECTOMY  05/1999  . DILATION AND CURETTAGE OF UTERUS  2011  . LASIK    . uterine ablation  2011  . VEIN LIGATION AND STRIPPING    . VIDEO BRONCHOSCOPY Bilateral 12/10/2015   Procedure: VIDEO BRONCHOSCOPY WITHOUT FLUORO;  Surgeon: Tanda Rockers, MD;  Location: [facility];  Service: Endoscopy;  Laterality: Bilateral;    Social History  Substance Use Topics  . Smoking status: Never Smoker  . Smokeless tobacco: Never Used  . Alcohol use 8.4 oz/week    14 Glasses of wine per week    Family History  Problem Relation Age of Onset  . Hypertension Mother   . Arthritis Mother   . Hypertension Father   . Arthritis Father   . Neuropathy    . Lymphoma Paternal Uncle   .  Cancer Paternal Grandfather   . Stroke Paternal Grandmother     Allergies  Allergen Reactions  . Amoxil [Amoxicillin]   . Penicillins Rash    Medication list has been reviewed and updated.  Current Outpatient Prescriptions on File Prior to Visit  Medication Sig Dispense Refill  . albuterol (PROVENTIL HFA;VENTOLIN HFA) 108 (90 Base) MCG/ACT inhaler Inhale 2 puffs into the lungs every 4 (four) hours as needed for wheezing or shortness of breath. 1 Inhaler 3  . azaTHIOprine (IMURAN) 50 MG tablet Take two 50 mg tablets (total 100 mg) by mouth once daily.  1  . Biotin 10000 MCG TABS Take by mouth.    Marland Kitchen BLISOVI 24 FE 1-20 MG-MCG(24) tablet Take 1 tablet by mouth daily.  3  . calcium-vitamin D (OSCAL 500/200 D-3) 500-200 MG-UNIT tablet Take 1 tablet by mouth.    . levothyroxine (SYNTHROID, LEVOTHROID) 50 MCG tablet Take by mouth.    . Magnesium 500 MG TABS Take by mouth.    . mometasone-formoterol (DULERA) 200-5 MCG/ACT AERO Inhale 2 puffs into the lungs 2 (two) times daily. 3 Inhaler 2  . pravastatin (PRAVACHOL) 40 MG tablet TAKE 1 TABLET DAILY.    Marland Kitchen predniSONE (DELTASONE) 20 MG tablet Take 20 mg by mouth.     Current Facility-Administered Medications on File Prior to Visit  Medication Dose Route Frequency Provider Last Rate Last Dose  . Mepolizumab SOLR 150 mg  150 mg Subcutaneous Q28 days Adelina Mings, MD   150 mg at 07/12/16 1348    Review of Systems:  As per HPI- otherwise negative.   Physical Examination: Vitals:   09/02/16 1357  BP: 118/72  Pulse: 84  Temp: 98 F (36.7 C)   Vitals:   09/02/16 1357  Weight: 142 lb 6.4 oz (64.6 kg)  Height: 5\' 6"  (1.676 m)   Body mass index is 22.98 kg/m. Ideal Body Weight: Weight in (lb) to have BMI = 25: 154.6  GEN: WDWN, NAD, Non-toxic, A & O x 3, appears healthy and younger than age 6: Atraumatic, Normocephalic. Neck supple. No masses, No LAD.  Bilateral TM wnl, oropharynx normal.  PEERL,EOMI.   Ears and Nose: No  external deformity. CV: RRR, No M/G/R. No JVD. No thrill. No extra heart sounds. PULM: CTA B, no wheezes, crackles, rhonchi. No retractions. No resp. distress. No accessory muscle use. ABD: S, NT, ND EXTR: No c/c/e NEURO Normal gait.  PSYCH: Normally interactive. Conversant. Not depressed or anxious appearing.  Calm demeanor.    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   Signed Lamar Blinks, MD   Labs 1/5  Results for orders placed or performed in visit on 09/02/16  TSH  Result Value Ref Range   TSH 0.63 0.35 - 4.50 uIU/mL  Lipid panel  Result Value Ref Range   Cholesterol 251 (H) 0 - 200 mg/dL   Triglycerides 91.0 0.0 - 149.0 mg/dL   HDL 61.20 >39.00 mg/dL   VLDL 18.2 0.0 - 40.0 mg/dL   LDL Cholesterol 172 (H) 0 - 99 mg/dL   Total CHOL/HDL Ratio 4    NonHDL 190.09    Message to pt Hi Judy Ramos- your thyroid looks fine.   Your cholesterol is a bit higher than is usual for you. This is probably due to being out of your cholesterol medication. Certainly your cholesterol is not terrible, but it will look better back on your medication.  I will go ahead and refill this for you and we can plan to repeat your level in one year.    Meds ordered this encounter  Medications  . alendronate (FOSAMAX) 70 MG tablet    Sig: Take 70 mg by mouth once a week. Take with a full glass of water on an empty stomach.  . doxylamine, Sleep, (UNISOM) 25 MG tablet    Sig: Take 25 mg by mouth at bedtime as needed.  . pravastatin (PRAVACHOL) 40 MG tablet    Sig: Take  1 tablet (40 mg total) by mouth daily.    Dispense:  90 tablet    Refill:  3

## 2016-09-02 NOTE — Telephone Encounter (Signed)
Noted  

## 2016-09-02 NOTE — Telephone Encounter (Signed)
[name] please submit.

## 2016-09-02 NOTE — Telephone Encounter (Signed)
I will go ahead with submission

## 2016-09-02 NOTE — Patient Instructions (Signed)
It was very nice to see you today- I will be in touch with your thyroid and cholesterol levels asap

## 2016-09-03 LAB — TSH: TSH: 0.63 u[IU]/mL (ref 0.35–4.50)

## 2016-09-03 MED ORDER — PRAVASTATIN SODIUM 40 MG PO TABS: 40.0000 mg | ORAL_TABLET | Freq: Every day | ORAL | 3 refills | Status: DC

## 2016-09-03 NOTE — Addendum Note (Signed)
Addended by: Lamar Blinks C on: 09/03/2016 01:17 PM   Modules accepted: Orders

## 2016-09-20 DIAGNOSIS — M301 Polyarteritis with lung involvement [Churg-Strauss]: Secondary | ICD-10-CM | POA: Diagnosis not present

## 2016-09-23 ENCOUNTER — Ambulatory Visit (INDEPENDENT_AMBULATORY_CARE_PROVIDER_SITE_OTHER): Payer: BLUE CROSS/BLUE SHIELD

## 2016-09-23 DIAGNOSIS — J4550 Severe persistent asthma, uncomplicated: Secondary | ICD-10-CM

## 2016-09-23 MED ORDER — BENRALIZUMAB 30 MG/ML ~~LOC~~ SOSY
30.0000 mg | PREFILLED_SYRINGE | Freq: Once | SUBCUTANEOUS | Status: AC
  Administered 2016-09-23: 30 mg via SUBCUTANEOUS

## 2016-09-26 ENCOUNTER — Encounter: Payer: Self-pay | Admitting: Allergy and Immunology

## 2016-09-27 ENCOUNTER — Telehealth: Payer: Self-pay

## 2016-09-27 NOTE — Telephone Encounter (Signed)
Hi Dr. Starling Manns - [name] is still working on getting the [place] approved. In the meantime, she set me up with a sample injection which I had on Thursday, January 25th. When do you think I can start lowering the Prednisone dosage? I am currently taking 20 mgs.     Thanks, Judy Ramos     Please advise

## 2016-09-27 NOTE — Telephone Encounter (Signed)
Attempt slow/gradual tapering of prednisone 7-10 days after her initial injection if symptoms seem to be well-controlled/stable.

## 2016-09-28 NOTE — Telephone Encounter (Signed)
Spoke to pt. She will start tapering her prednisone starting Sunday. She will take 15mg  x's 1 wk, then 10mg  x's 1 wk, then 5mg  x's 1 wk, then 2 1/2mg  x's 1 week, then stop. Does this sound like an appropriate schedule?

## 2016-09-28 NOTE — Telephone Encounter (Signed)
Please inform the patient that I recommend going from 15 mg to 12.5 mg to 10 mg to 7.5 mg to 5 mg to 2.5 mg with reduction in dose occurring every 1-2 weeks.  However, she needs to be careful to slow down/stop the taper if she begins to experience symptoms.  Thanks.

## 2016-09-28 NOTE — Telephone Encounter (Signed)
Pt. Informed regarding new instructions on tapering down her prednisone.

## 2016-10-01 ENCOUNTER — Encounter: Payer: Self-pay | Admitting: Allergy and Immunology

## 2016-10-04 ENCOUNTER — Encounter: Payer: Self-pay | Admitting: Allergy and Immunology

## 2016-10-04 ENCOUNTER — Telehealth: Payer: Self-pay

## 2016-10-04 DIAGNOSIS — Z6823 Body mass index (BMI) 23.0-23.9, adult: Secondary | ICD-10-CM | POA: Diagnosis not present

## 2016-10-04 DIAGNOSIS — Z01419 Encounter for gynecological examination (general) (routine) without abnormal findings: Secondary | ICD-10-CM | POA: Diagnosis not present

## 2016-10-04 DIAGNOSIS — E6609 Other obesity due to excess calories: Secondary | ICD-10-CM | POA: Diagnosis not present

## 2016-10-04 DIAGNOSIS — Z1231 Encounter for screening mammogram for malignant neoplasm of breast: Secondary | ICD-10-CM | POA: Diagnosis not present

## 2016-10-04 NOTE — Telephone Encounter (Signed)
Patient informed about staying an hour after her nect injection.

## 2016-10-04 NOTE — Telephone Encounter (Signed)
Hello Dr. Verlin Fester - I wanted to let you know that I am experiencing some swelling in my face - nothing major but definitely evident to me and my husband. My airway is fine at the moment. I was not sure if this was something you wished to know.  Not sure if it has to do with the new injection or just me... I have not changed any other medications.  Judy Ramos  Hello - I just wanted to up date you on my previous email about swelling of the face. The swelling has gone down - I took some Benadryl and I believe that may have helped. Not sure what caused it.  Thank you, Judy Ramos

## 2016-10-04 NOTE — Telephone Encounter (Signed)
Please plan to stay in the waiting room for at least 1 hour after the next injection. Please inform me if this, or any other, problem occurs after the next injection.  Thanks.

## 2016-10-06 DIAGNOSIS — J4550 Severe persistent asthma, uncomplicated: Secondary | ICD-10-CM | POA: Diagnosis not present

## 2016-10-18 DIAGNOSIS — Z780 Asymptomatic menopausal state: Secondary | ICD-10-CM | POA: Diagnosis not present

## 2016-10-20 ENCOUNTER — Ambulatory Visit (INDEPENDENT_AMBULATORY_CARE_PROVIDER_SITE_OTHER): Payer: BLUE CROSS/BLUE SHIELD | Admitting: *Deleted

## 2016-10-20 DIAGNOSIS — J4550 Severe persistent asthma, uncomplicated: Secondary | ICD-10-CM | POA: Diagnosis not present

## 2016-10-20 MED ORDER — BENRALIZUMAB 30 MG/ML ~~LOC~~ SOSY
30.0000 mg | PREFILLED_SYRINGE | Freq: Once | SUBCUTANEOUS | Status: AC
  Administered 2016-10-20: 30 mg via SUBCUTANEOUS

## 2016-10-26 ENCOUNTER — Encounter: Payer: Self-pay | Admitting: Allergy and Immunology

## 2016-11-02 ENCOUNTER — Telehealth: Payer: Self-pay

## 2016-11-02 NOTE — Telephone Encounter (Signed)
Hello Dr. Verlin Fester - Just a quick update on my symptoms. I received the 2nd Faserna injection last Wednesday, February 21st. So far I have had no reactions. I have been reducing my Prednisone dosage by 2.5mg s - I am now down to 7.5 mgs as of Sunday, February 25th. My symptoms are stable and I am feeling pretty good. This is the lowest dosage I have been able to go without having issues like, congestion, cough and breathing impaired. Very exciting!! I think this medicine is working. I am still taking the [facility] twice a day - which I believe is still needed. Whenever I forget to take it at night - I can tell.    Thanks, Judy Ramos     Please advise

## 2016-11-02 NOTE — Telephone Encounter (Signed)
Please tell Gearlene that I am very glad to hear the good news and please encourage her to keep me updated. Thanks.

## 2016-11-10 DIAGNOSIS — J4550 Severe persistent asthma, uncomplicated: Secondary | ICD-10-CM | POA: Diagnosis not present

## 2016-11-22 ENCOUNTER — Ambulatory Visit (INDEPENDENT_AMBULATORY_CARE_PROVIDER_SITE_OTHER): Payer: BLUE CROSS/BLUE SHIELD

## 2016-11-22 ENCOUNTER — Ambulatory Visit: Payer: Self-pay

## 2016-11-22 DIAGNOSIS — J4550 Severe persistent asthma, uncomplicated: Secondary | ICD-10-CM | POA: Diagnosis not present

## 2016-11-25 MED ORDER — BENRALIZUMAB 30 MG/ML ~~LOC~~ SOSY
30.0000 mg | PREFILLED_SYRINGE | SUBCUTANEOUS | Status: DC
  Administered 2017-01-13: 30 mg via SUBCUTANEOUS

## 2016-11-25 NOTE — Addendum Note (Signed)
Addended by: Carin Hock on: 11/25/2016 12:05 PM   Modules accepted: Orders

## 2016-12-02 DIAGNOSIS — M150 Primary generalized (osteo)arthritis: Secondary | ICD-10-CM | POA: Diagnosis not present

## 2016-12-02 DIAGNOSIS — M7062 Trochanteric bursitis, left hip: Secondary | ICD-10-CM | POA: Diagnosis not present

## 2016-12-02 DIAGNOSIS — M301 Polyarteritis with lung involvement [Churg-Strauss]: Secondary | ICD-10-CM | POA: Diagnosis not present

## 2016-12-02 DIAGNOSIS — M7061 Trochanteric bursitis, right hip: Secondary | ICD-10-CM | POA: Diagnosis not present

## 2016-12-14 DIAGNOSIS — M7061 Trochanteric bursitis, right hip: Secondary | ICD-10-CM | POA: Diagnosis not present

## 2016-12-14 DIAGNOSIS — M25552 Pain in left hip: Secondary | ICD-10-CM | POA: Diagnosis not present

## 2016-12-14 DIAGNOSIS — M7062 Trochanteric bursitis, left hip: Secondary | ICD-10-CM | POA: Diagnosis not present

## 2016-12-14 DIAGNOSIS — M25551 Pain in right hip: Secondary | ICD-10-CM | POA: Diagnosis not present

## 2016-12-17 DIAGNOSIS — M7062 Trochanteric bursitis, left hip: Secondary | ICD-10-CM | POA: Diagnosis not present

## 2016-12-17 DIAGNOSIS — M25551 Pain in right hip: Secondary | ICD-10-CM | POA: Diagnosis not present

## 2016-12-17 DIAGNOSIS — M25552 Pain in left hip: Secondary | ICD-10-CM | POA: Diagnosis not present

## 2016-12-17 DIAGNOSIS — M7061 Trochanteric bursitis, right hip: Secondary | ICD-10-CM | POA: Diagnosis not present

## 2016-12-20 DIAGNOSIS — M7062 Trochanteric bursitis, left hip: Secondary | ICD-10-CM | POA: Diagnosis not present

## 2016-12-20 DIAGNOSIS — M7061 Trochanteric bursitis, right hip: Secondary | ICD-10-CM | POA: Diagnosis not present

## 2016-12-20 DIAGNOSIS — M25552 Pain in left hip: Secondary | ICD-10-CM | POA: Diagnosis not present

## 2016-12-20 DIAGNOSIS — M25551 Pain in right hip: Secondary | ICD-10-CM | POA: Diagnosis not present

## 2016-12-22 DIAGNOSIS — M7062 Trochanteric bursitis, left hip: Secondary | ICD-10-CM | POA: Diagnosis not present

## 2016-12-22 DIAGNOSIS — M25551 Pain in right hip: Secondary | ICD-10-CM | POA: Diagnosis not present

## 2016-12-22 DIAGNOSIS — M25552 Pain in left hip: Secondary | ICD-10-CM | POA: Diagnosis not present

## 2016-12-22 DIAGNOSIS — M7061 Trochanteric bursitis, right hip: Secondary | ICD-10-CM | POA: Diagnosis not present

## 2016-12-27 DIAGNOSIS — M25551 Pain in right hip: Secondary | ICD-10-CM | POA: Diagnosis not present

## 2016-12-27 DIAGNOSIS — M7062 Trochanteric bursitis, left hip: Secondary | ICD-10-CM | POA: Diagnosis not present

## 2016-12-27 DIAGNOSIS — M7061 Trochanteric bursitis, right hip: Secondary | ICD-10-CM | POA: Diagnosis not present

## 2016-12-27 DIAGNOSIS — M25552 Pain in left hip: Secondary | ICD-10-CM | POA: Diagnosis not present

## 2016-12-29 ENCOUNTER — Encounter: Payer: Self-pay | Admitting: Family Medicine

## 2016-12-29 DIAGNOSIS — M7061 Trochanteric bursitis, right hip: Secondary | ICD-10-CM | POA: Diagnosis not present

## 2016-12-29 DIAGNOSIS — M25551 Pain in right hip: Secondary | ICD-10-CM | POA: Diagnosis not present

## 2016-12-29 DIAGNOSIS — M7062 Trochanteric bursitis, left hip: Secondary | ICD-10-CM | POA: Diagnosis not present

## 2016-12-29 DIAGNOSIS — M25552 Pain in left hip: Secondary | ICD-10-CM | POA: Diagnosis not present

## 2016-12-31 MED ORDER — LEVOTHYROXINE SODIUM 50 MCG PO TABS: 50.0000 ug | ORAL_TABLET | Freq: Every day | ORAL | 2 refills | Status: DC

## 2017-01-04 DIAGNOSIS — M25552 Pain in left hip: Secondary | ICD-10-CM | POA: Diagnosis not present

## 2017-01-04 DIAGNOSIS — M7061 Trochanteric bursitis, right hip: Secondary | ICD-10-CM | POA: Diagnosis not present

## 2017-01-04 DIAGNOSIS — M7062 Trochanteric bursitis, left hip: Secondary | ICD-10-CM | POA: Diagnosis not present

## 2017-01-04 DIAGNOSIS — M25551 Pain in right hip: Secondary | ICD-10-CM | POA: Diagnosis not present

## 2017-01-05 ENCOUNTER — Encounter: Payer: Self-pay | Admitting: Allergy and Immunology

## 2017-01-07 DIAGNOSIS — M25552 Pain in left hip: Secondary | ICD-10-CM | POA: Diagnosis not present

## 2017-01-07 DIAGNOSIS — J4550 Severe persistent asthma, uncomplicated: Secondary | ICD-10-CM | POA: Diagnosis not present

## 2017-01-07 DIAGNOSIS — M7062 Trochanteric bursitis, left hip: Secondary | ICD-10-CM | POA: Diagnosis not present

## 2017-01-07 DIAGNOSIS — M7061 Trochanteric bursitis, right hip: Secondary | ICD-10-CM | POA: Diagnosis not present

## 2017-01-07 DIAGNOSIS — M25551 Pain in right hip: Secondary | ICD-10-CM | POA: Diagnosis not present

## 2017-01-10 ENCOUNTER — Telehealth: Payer: Self-pay

## 2017-01-10 DIAGNOSIS — N760 Acute vaginitis: Secondary | ICD-10-CM | POA: Diagnosis not present

## 2017-01-10 DIAGNOSIS — N941 Unspecified dyspareunia: Secondary | ICD-10-CM | POA: Diagnosis not present

## 2017-01-10 NOTE — Telephone Encounter (Signed)
Called patient.  Gave info per Dr. Verlin Fester and also sent to patients MY CHART. Message: Consider increasing prednisone to 12.5 or 15 mg daily to help with symptoms until you receive your next Fasenra injection. If symptoms improve after the injection but begin to return over the next 4-6 weeks after the injection we may consider changing the dosing interval to every 4 weeks. Continue Dulera 200-5 g, 2 inhalations via spacer device twice a day. Keep me posted on your progress.

## 2017-01-13 ENCOUNTER — Ambulatory Visit (INDEPENDENT_AMBULATORY_CARE_PROVIDER_SITE_OTHER): Payer: BLUE CROSS/BLUE SHIELD | Admitting: *Deleted

## 2017-01-13 DIAGNOSIS — J4550 Severe persistent asthma, uncomplicated: Secondary | ICD-10-CM

## 2017-01-13 MED ORDER — BENRALIZUMAB 30 MG/ML ~~LOC~~ SOSY
30.0000 mg | PREFILLED_SYRINGE | SUBCUTANEOUS | Status: DC
  Administered 2017-03-10: 30 mg via SUBCUTANEOUS

## 2017-01-17 ENCOUNTER — Ambulatory Visit: Payer: Self-pay

## 2017-01-26 ENCOUNTER — Encounter: Payer: Self-pay | Admitting: Allergy and Immunology

## 2017-01-27 ENCOUNTER — Telehealth: Payer: Self-pay

## 2017-01-27 NOTE — Telephone Encounter (Signed)
Symbicort 169, 2 puff via spacer twice a day. Give her the savings card so she can get it for free. Thanks.

## 2017-01-27 NOTE — Telephone Encounter (Signed)
I was wondering if there are any other inhalers I can try. With the new year, my deductible is back. To refill the [facility] at [facility] is costs $620 and through [facility] it is around $315. I am currently doing the 264mcg/5mcg dosage - two puffs twice a day. I feel like it lasts maybe two months? I really am not sure though.... Thank you!     PS - I am currently on 13 mgs of Prednisone - doing okay but still having some coughing issues at night - nothing major though.    Please advise

## 2017-01-28 MED ORDER — BUDESONIDE-FORMOTEROL FUMARATE 160-4.5 MCG/ACT IN AERO: 2.0000 | INHALATION_SPRAY | Freq: Two times a day (BID) | RESPIRATORY_TRACT | 3 refills | Status: DC

## 2017-01-28 NOTE — Telephone Encounter (Signed)
symbicort 160 inhalers placed up front x2 for patient to pick up

## 2017-01-28 NOTE — Addendum Note (Signed)
Addended by: Lucrezia Starch I on: 01/28/2017 09:49 AM   Modules accepted: Orders

## 2017-01-28 NOTE — Telephone Encounter (Signed)
Prescription has been sent. Patient is aware. I am going to have [place] pull 2 samples and card for her to pick up today or Monday.

## 2017-01-28 NOTE — Telephone Encounter (Signed)
FYI

## 2017-02-01 DIAGNOSIS — J4550 Severe persistent asthma, uncomplicated: Secondary | ICD-10-CM | POA: Diagnosis not present

## 2017-02-24 ENCOUNTER — Telehealth: Payer: Self-pay | Admitting: *Deleted

## 2017-02-24 NOTE — Telephone Encounter (Signed)
Patient just wanted to update you on her situation. She's on 9mg  of prednisone and her symptoms are starting to return (stuffy nose, lack of smell,coughing, shortness of breath) next Fasenra injection is scheduled for July 12th.

## 2017-02-24 NOTE — Telephone Encounter (Signed)
This will be a first for me but I looked in chart and think we can try for this.  I will let you know how the progress goes and contact patient once I get approval and Rx ready to go.  I will call you if I run into any problems.  I am curious how this dose will work for her too.

## 2017-02-24 NOTE — Telephone Encounter (Signed)
Judy Ramos was previously on mepo 100 or 150 mg q 4 weeks. She has done somewhat better after having switched to benralizumab but is still having some issues. Thinking about switching her to the EGPA dose of mepolizumab (300mg  q 4 weeks).  She is willing to try this, however is concerned that if the mepo does not work out that she would have trouble getting the same 0 co-pay with the benralizumab if she has to switch back again.  Thoughts? Somewhat convoluted - call me if needed.

## 2017-02-24 NOTE — Telephone Encounter (Signed)
Great - thanks

## 2017-03-07 ENCOUNTER — Encounter: Payer: Self-pay | Admitting: Allergy and Immunology

## 2017-03-07 ENCOUNTER — Telehealth: Payer: Self-pay

## 2017-03-07 DIAGNOSIS — J4550 Severe persistent asthma, uncomplicated: Secondary | ICD-10-CM | POA: Diagnosis not present

## 2017-03-07 NOTE — Telephone Encounter (Signed)
As [name] is out of the office this week, I do think that she should proceed with the Fasenra injection this Thursday to cover things until the insurance coverage is sorted out with the EGPA dose of Nucala.

## 2017-03-07 NOTE — Telephone Encounter (Signed)
Hello Dr. Verlin Fester - just touching base on the medicine switch back to Nucala. I have not heard from [employer] - I called today and she is out of the office this week. I have an appointment scheduled there at the [facility] office for this Thursday, July 12th at 9:00 am to receive my Fasenra injection. I assume you want me to get it and then figure out the Nucala? Or do you want me to hold off? According to my insurance, I have met my deductible - they applied one of the injection amounts to it. So not sure what is going on.... I am up to 12 mg of Prednsione with some continued conditions like congestion and that weird smell in my nose - but coughing has stayed the same - which is not too bad. Just let me know.    Thanks,    Please advise

## 2017-03-09 NOTE — Telephone Encounter (Signed)
Informed patient of Dr. Mariane Masters note.

## 2017-03-10 ENCOUNTER — Ambulatory Visit (INDEPENDENT_AMBULATORY_CARE_PROVIDER_SITE_OTHER): Payer: BLUE CROSS/BLUE SHIELD

## 2017-03-10 DIAGNOSIS — J4550 Severe persistent asthma, uncomplicated: Secondary | ICD-10-CM | POA: Diagnosis not present

## 2017-03-14 ENCOUNTER — Encounter: Payer: Self-pay | Admitting: Allergy and Immunology

## 2017-03-15 ENCOUNTER — Telehealth: Payer: Self-pay

## 2017-03-15 NOTE — Telephone Encounter (Signed)
I advised patient of instructions and talked to her about denial.  I am going to try to locate [employer] and go forward with appeal.  I did send them quite a bit info including Rheumatology notes, labs and past hx of Nucala and Fasenra. I was not surprised of denial but will let you know if I need you to do peer to peer or anything else.

## 2017-03-15 NOTE — Telephone Encounter (Signed)
Noted  

## 2017-03-15 NOTE — Telephone Encounter (Signed)
Thanks for the update. It is ok to titrate up to 15mg  of pred if needed for symptom relief. I had heard that things were moving in the right direction regarding insurance, but will forward this note to [name] and have her contact you regarding the progress.

## 2017-03-15 NOTE — Telephone Encounter (Signed)
Hello Dr. Verlin Fester - I wanted to let you know that I received the Fasenra injection last Thursday morning and as of now do not feel like much has changed symptom-wise. In fact, my sense of smell and taste are gone except for salt and spicy foods - basically just tasting the those - not the flavor of the food. I have been taking Benadryl and Tylenol PM to get me through the night. The congestion in the morning is pretty heavy. This usually tells me the [place] is showing itself. My breathing is not terrible -just labored a bit when walking stairs but no major coughing. I am still at 12 mgs of Prednisone - trying not to increase it - but if my sense of smell etc does not improve - I might go back up to 15 and see what happens.     It appears the insurance rejected the first attempt to try the Nucala at the higher dose - not sure if [name] is aware of this. Any ideas on what we should do??    Thanks,  Judy Ramos

## 2017-03-16 ENCOUNTER — Other Ambulatory Visit: Payer: Self-pay

## 2017-03-16 MED ORDER — PREDNISONE 5 MG PO TABS: ORAL_TABLET | ORAL | 0 refills | Status: DC

## 2017-03-24 ENCOUNTER — Telehealth: Payer: Self-pay | Admitting: *Deleted

## 2017-03-24 NOTE — Telephone Encounter (Signed)
Called and advised patient that appeal I sent to [facility] was approved. Will move forward with Nucala 300mg  every 28 days and will be sending Rx to specialty pharmacy and she will fax me form today for copay for program.

## 2017-03-24 NOTE — Telephone Encounter (Signed)
Noted. Thanks.

## 2017-03-27 ENCOUNTER — Encounter: Payer: Self-pay | Admitting: Allergy and Immunology

## 2017-03-28 ENCOUNTER — Telehealth: Payer: Self-pay

## 2017-03-28 NOTE — Telephone Encounter (Signed)
My hope is that the increased dose of Nucala will be helpful. I am not opposed at all to going for another opinion, especially since this is an on-going problem. I was disappointed with the last trip to a multi-specialty center, but [facility] is in a league of it's own.

## 2017-03-28 NOTE — Telephone Encounter (Signed)
Lm for pt to call us back  

## 2017-03-28 NOTE — Telephone Encounter (Signed)
Spoke with pt and informed her of what dr bobbitt has stated

## 2017-03-28 NOTE — Telephone Encounter (Signed)
Hi Dr. Verlin Fester - I wanted to let you know that I have had to go up to 20 mgs of Prednisone - which is not great - just to minimize the high congestion and cough. At 20 mgs - I am not as congested but am not able to smell - only that weird sick smell inside my nose - and taste is minimal.     [name] has me approved for the 300 mgs of Nucala -which I am thankful for... [name] is great!     Now - here is something that has been bouncing around my head.... we really do not have a true diagnosis of what I have... what do you think of getting a biopsy? I know you were tentative about this but part of me just wants to know a definitive answer -- so we know what we are dealing with. Its been a long haul with this crap to be honest. I have had some people suggest [facility] - but that is a pretty big step... but at this point .... I am open to anything.  Let me know your thoughts.      Thanks! Judy Ramos   Please advise

## 2017-03-29 DIAGNOSIS — M150 Primary generalized (osteo)arthritis: Secondary | ICD-10-CM | POA: Diagnosis not present

## 2017-03-29 DIAGNOSIS — M7062 Trochanteric bursitis, left hip: Secondary | ICD-10-CM | POA: Diagnosis not present

## 2017-03-29 DIAGNOSIS — M7061 Trochanteric bursitis, right hip: Secondary | ICD-10-CM | POA: Diagnosis not present

## 2017-03-29 DIAGNOSIS — M301 Polyarteritis with lung involvement [Churg-Strauss]: Secondary | ICD-10-CM | POA: Diagnosis not present

## 2017-03-30 ENCOUNTER — Encounter: Payer: Self-pay | Admitting: Allergy and Immunology

## 2017-04-04 DIAGNOSIS — M301 Polyarteritis with lung involvement [Churg-Strauss]: Secondary | ICD-10-CM | POA: Diagnosis not present

## 2017-04-06 ENCOUNTER — Ambulatory Visit (INDEPENDENT_AMBULATORY_CARE_PROVIDER_SITE_OTHER): Payer: BLUE CROSS/BLUE SHIELD | Admitting: *Deleted

## 2017-04-06 DIAGNOSIS — M301 Polyarteritis with lung involvement [Churg-Strauss]: Secondary | ICD-10-CM

## 2017-04-06 DIAGNOSIS — D7218 Eosinophilia in diseases classified elsewhere: Secondary | ICD-10-CM

## 2017-04-07 MED ORDER — MEPOLIZUMAB 100 MG ~~LOC~~ SOLR
300.0000 mg | SUBCUTANEOUS | Status: AC
  Administered 2017-04-06 – 2019-04-12 (×27): 300 mg via SUBCUTANEOUS

## 2017-04-08 ENCOUNTER — Other Ambulatory Visit: Payer: Self-pay | Admitting: Allergy and Immunology

## 2017-04-15 DIAGNOSIS — J4550 Severe persistent asthma, uncomplicated: Secondary | ICD-10-CM | POA: Diagnosis not present

## 2017-04-29 DIAGNOSIS — M9901 Segmental and somatic dysfunction of cervical region: Secondary | ICD-10-CM | POA: Diagnosis not present

## 2017-04-29 DIAGNOSIS — R51 Headache: Secondary | ICD-10-CM | POA: Diagnosis not present

## 2017-04-29 DIAGNOSIS — M791 Myalgia: Secondary | ICD-10-CM | POA: Diagnosis not present

## 2017-04-29 DIAGNOSIS — M9902 Segmental and somatic dysfunction of thoracic region: Secondary | ICD-10-CM | POA: Diagnosis not present

## 2017-05-02 ENCOUNTER — Encounter: Payer: Self-pay | Admitting: Allergy and Immunology

## 2017-05-03 ENCOUNTER — Telehealth: Payer: Self-pay

## 2017-05-03 DIAGNOSIS — M301 Polyarteritis with lung involvement [Churg-Strauss]: Secondary | ICD-10-CM | POA: Diagnosis not present

## 2017-05-03 DIAGNOSIS — M791 Myalgia: Secondary | ICD-10-CM | POA: Diagnosis not present

## 2017-05-03 DIAGNOSIS — M9901 Segmental and somatic dysfunction of cervical region: Secondary | ICD-10-CM | POA: Diagnosis not present

## 2017-05-03 DIAGNOSIS — R51 Headache: Secondary | ICD-10-CM | POA: Diagnosis not present

## 2017-05-03 DIAGNOSIS — M9902 Segmental and somatic dysfunction of thoracic region: Secondary | ICD-10-CM | POA: Diagnosis not present

## 2017-05-03 NOTE — Telephone Encounter (Signed)
I'm glad to hear that you have been able to decrease the prednisone after switching to the higher dose of Nucala.  I am unaware of any of those medications causing indigestion. Prednisone can cause abdominal discomfort for some, but I would have expected this problem to have occurred earlier in the course of prednisone and at the higher rather than lower doses. You can try over the counter omeprazole (Prilosec OTC) to seek if the sx recede or resolve. If the GI symptoms persist or progress discuss with PCP or consider GI referral.

## 2017-05-03 NOTE — Telephone Encounter (Signed)
Hello - I wanted to see what you thought about this ; I am experiencing severe indigestion several times a week. I have not changed my diet, exercise or sleeping habits. Do you think this could be caused by any of the medicines I am currently taking?     I am on 10 mgs of Prednisone - I came down from 20 mgs - going from 20 to 15 to 13 to 11 to 10 mgs over the past 3 weeks. I am also taking the Symbicort 160/4.5  2 puffs/twice a day and then Nucala 300 mgs (I am getting my second injection on Thursday).     For the indigestion - which feels like a knife in my back and chest (right side) I have to take the following; 4 -6 tums, 2 ibuprofen for the pain, Tagamet. Today - I have been dealing with it since 11:30 in the morning and it is now 8:30 pm. I have taken the previous mentioned stuff twice today. It will go away and then flare up.     Let me know your thoughts or if there is something else I can take that might work better.     Thanks,  Judy Ramos    Please advise

## 2017-05-04 ENCOUNTER — Ambulatory Visit: Payer: Self-pay

## 2017-05-04 DIAGNOSIS — R51 Headache: Secondary | ICD-10-CM | POA: Diagnosis not present

## 2017-05-04 DIAGNOSIS — M791 Myalgia: Secondary | ICD-10-CM | POA: Diagnosis not present

## 2017-05-04 DIAGNOSIS — M9901 Segmental and somatic dysfunction of cervical region: Secondary | ICD-10-CM | POA: Diagnosis not present

## 2017-05-04 DIAGNOSIS — M9902 Segmental and somatic dysfunction of thoracic region: Secondary | ICD-10-CM | POA: Diagnosis not present

## 2017-05-04 NOTE — Telephone Encounter (Signed)
Lm for pt to call us back  

## 2017-05-04 NOTE — Telephone Encounter (Signed)
Informed pt of what dr bobbitt has stated.

## 2017-05-05 ENCOUNTER — Ambulatory Visit (INDEPENDENT_AMBULATORY_CARE_PROVIDER_SITE_OTHER): Payer: BLUE CROSS/BLUE SHIELD

## 2017-05-05 ENCOUNTER — Ambulatory Visit: Payer: Self-pay

## 2017-05-05 DIAGNOSIS — M301 Polyarteritis with lung involvement [Churg-Strauss]: Secondary | ICD-10-CM | POA: Diagnosis not present

## 2017-05-05 DIAGNOSIS — J4550 Severe persistent asthma, uncomplicated: Secondary | ICD-10-CM

## 2017-05-10 DIAGNOSIS — M9901 Segmental and somatic dysfunction of cervical region: Secondary | ICD-10-CM | POA: Diagnosis not present

## 2017-05-10 DIAGNOSIS — M9902 Segmental and somatic dysfunction of thoracic region: Secondary | ICD-10-CM | POA: Diagnosis not present

## 2017-05-10 DIAGNOSIS — R51 Headache: Secondary | ICD-10-CM | POA: Diagnosis not present

## 2017-05-10 DIAGNOSIS — M791 Myalgia: Secondary | ICD-10-CM | POA: Diagnosis not present

## 2017-05-11 DIAGNOSIS — M9902 Segmental and somatic dysfunction of thoracic region: Secondary | ICD-10-CM | POA: Diagnosis not present

## 2017-05-11 DIAGNOSIS — R51 Headache: Secondary | ICD-10-CM | POA: Diagnosis not present

## 2017-05-11 DIAGNOSIS — M9901 Segmental and somatic dysfunction of cervical region: Secondary | ICD-10-CM | POA: Diagnosis not present

## 2017-05-11 DIAGNOSIS — M791 Myalgia: Secondary | ICD-10-CM | POA: Diagnosis not present

## 2017-05-16 DIAGNOSIS — R51 Headache: Secondary | ICD-10-CM | POA: Diagnosis not present

## 2017-05-16 DIAGNOSIS — M9901 Segmental and somatic dysfunction of cervical region: Secondary | ICD-10-CM | POA: Diagnosis not present

## 2017-05-16 DIAGNOSIS — M9902 Segmental and somatic dysfunction of thoracic region: Secondary | ICD-10-CM | POA: Diagnosis not present

## 2017-05-16 DIAGNOSIS — M791 Myalgia: Secondary | ICD-10-CM | POA: Diagnosis not present

## 2017-05-26 ENCOUNTER — Other Ambulatory Visit: Payer: Self-pay | Admitting: Allergy and Immunology

## 2017-06-02 ENCOUNTER — Telehealth: Payer: Self-pay

## 2017-06-02 ENCOUNTER — Ambulatory Visit: Payer: Self-pay

## 2017-06-02 NOTE — Telephone Encounter (Signed)
Patient was scheduled today, 06/02/17 to get her Nucala 100 mg x 3 vials (300 mg total).  Her Berna Bue is here but patient states Dr. Starling Manns changed her back to Nucala 300 mg back in July and d/c Fasenra. Her Nucala is not here.  Please order Nucala for patient and let her know when it arrives to reschedule appointment. Thanks.

## 2017-06-06 DIAGNOSIS — M301 Polyarteritis with lung involvement [Churg-Strauss]: Secondary | ICD-10-CM | POA: Diagnosis not present

## 2017-06-06 NOTE — Telephone Encounter (Signed)
Unfortunately someone had listed it there and it was not and for some reason they had not filed her Berna Bue to inactive and maybe that was the reason.  Hopefully it is now filed to inactive to keep that from happening again?  I did call and order for same to arrive tomorrow and advised patient to check with office in th pm to check arrival and get injs.

## 2017-06-08 ENCOUNTER — Ambulatory Visit (INDEPENDENT_AMBULATORY_CARE_PROVIDER_SITE_OTHER): Payer: BLUE CROSS/BLUE SHIELD | Admitting: Allergy and Immunology

## 2017-06-08 ENCOUNTER — Ambulatory Visit (INDEPENDENT_AMBULATORY_CARE_PROVIDER_SITE_OTHER): Payer: BLUE CROSS/BLUE SHIELD | Admitting: *Deleted

## 2017-06-08 ENCOUNTER — Ambulatory Visit: Payer: Self-pay | Admitting: *Deleted

## 2017-06-08 ENCOUNTER — Encounter: Payer: Self-pay | Admitting: Allergy and Immunology

## 2017-06-08 VITALS — BP 118/68 | HR 75 | Temp 98.1°F | Resp 16 | Ht 65.5 in | Wt 142.0 lb

## 2017-06-08 DIAGNOSIS — D721 Eosinophilia, unspecified: Secondary | ICD-10-CM

## 2017-06-08 DIAGNOSIS — J3089 Other allergic rhinitis: Secondary | ICD-10-CM

## 2017-06-08 DIAGNOSIS — J4540 Moderate persistent asthma, uncomplicated: Secondary | ICD-10-CM | POA: Diagnosis not present

## 2017-06-08 MED ORDER — FLUTICASONE PROPIONATE 93 MCG/ACT NA EXHU: 2.0000 | INHALANT_SUSPENSION | Freq: Two times a day (BID) | NASAL | 5 refills | Status: DC

## 2017-06-08 MED ORDER — CARBINOXAMINE MALEATE 6 MG PO TABS: 1.0000 | ORAL_TABLET | Freq: Three times a day (TID) | ORAL | 5 refills | Status: DC

## 2017-06-08 NOTE — Assessment & Plan Note (Signed)
Judy Ramos's nasal/sinus symptoms currently seem to be limiting factors with regards to decreasing the prednisone dose.  A prescription has been provided for [facility], 2 actuations per nostril twice a day. Proper technique has been discussed and demonstrated.  Nasal saline lavage (NeilMed) has been recommended as needed and prior to medicated nasal sprays along with instructions for proper administration.  A prescription has been provided for RyVent (carbinoxamine maleate) 6mg  every 6-8 hours as needed.

## 2017-06-08 NOTE — Telephone Encounter (Signed)
Nucala here and patient scheduled today to receive injection.

## 2017-06-08 NOTE — Patient Instructions (Signed)
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   Continue mepolizomab (Nucala) 300 mg every 28 days as prescribed.  Continue prednisone, with slow/gradual tapering to the lowest effective dose.  Follow up with rheumatologist, Dr. Trudie Reed, as directed.  Judy Ramos's bone density is being followed.  Continue Fosamax as prescribed as well as calcium/vit D supplementation.  Moderate persistent asthma Currently well controlled.  Continue mepolizomab (as above), Symbicort 160-4.5 g, 2 inhalations via spacer device twice a day, and albuterol every 4-6 hours if needed.  If subjective and objective measures of pulmonary function remain stable, we will consider stepping down therapy on the next visit.  Perennial allergic rhinitis with a nonallergic component Judy Ramos's nasal/sinus symptoms currently seem to be limiting factors with regards to decreasing the prednisone dose.  A prescription has been provided for [facility], 2 actuations per nostril twice a day. Proper technique has been discussed and demonstrated.  Nasal saline lavage (NeilMed) has been recommended as needed and prior to medicated nasal sprays along with instructions for proper administration.  A prescription has been provided for RyVent (carbinoxamine maleate) 6mg  every 6-8 hours as needed.   Return in about 5 months (around 11/06/2017), or if symptoms worsen or fail to improve.

## 2017-06-08 NOTE — Assessment & Plan Note (Signed)
Currently well controlled.  Continue mepolizomab (as above), Symbicort 160-4.5 g, 2 inhalations via spacer device twice a day, and albuterol every 4-6 hours if needed.  If subjective and objective measures of pulmonary function remain stable, we will consider stepping down therapy on the next visit.

## 2017-06-08 NOTE — Progress Notes (Signed)
Follow-up Note  RE: Judy Ramos MRN: [id number] DOB: [birth year] Date of Office Visit: 06/08/2017  Primary care provider: Darreld Mclean, MD Referring provider: Darreld Mclean, MD  History of present illness: Judy Ramos is a 56 y.o. 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  She was last seen in this clinic in December 2017.  Today she received her third round of mepolizomab (Nucala) at the 300 mg dose.  Over this past month she has required 15 mg of prednisone daily to keep her symptoms under control.  She would like to decrease her prednisone dose, however at 10 mg of prednisone daily she had been experiencing nasal congestion, anosmia, and sinus pressure.  She admits that she has not been using medicated nasal spray.  Her lower respiratory symptoms have been well-controlled regardless of the prednisone dose while on benralizumab or mepolizomab.  She is currently taking Symbicort 160-4.5 g, 2 inhalations via spacer device twice a day.  She rarely requires albuterol rescue and does not experience nocturnal awakenings due to lower respiratory symptoms.   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   Continue mepolizomab (Nucala) 300 mg every 28 days as prescribed.  Continue prednisone, with slow/gradual tapering to the lowest effective dose.  Follow up with rheumatologist, Dr. Trudie Reed, as directed.  [place]'s bone density is being followed.  Continue Fosamax as prescribed as well as calcium/vit D supplementation.  Moderate persistent asthma Currently well controlled.  Continue mepolizomab (as above), Symbicort 160-4.5 g, 2 inhalations via spacer device twice a day, and albuterol every 4-6 hours if needed.  If subjective and objective measures of pulmonary function remain stable, we will consider stepping down  therapy on the next visit.  Perennial allergic rhinitis with a nonallergic component Judy Ramos's nasal/sinus symptoms currently seem to be limiting factors with regards to decreasing the prednisone dose.  A prescription has been provided for [facility], 2 actuations per nostril twice a day. Proper technique has been discussed and demonstrated.  Nasal saline lavage (NeilMed) has been recommended as needed and prior to medicated nasal sprays along with instructions for proper administration.  A prescription has been provided for RyVent (carbinoxamine maleate) 6mg  every 6-8 hours as needed.   Meds ordered this encounter  Medications  . Fluticasone Propionate (XHANCE) 93 MCG/ACT EXHU    Sig: Place 2 sprays into the nose 2 (two) times daily.    Dispense:  32 mL    Refill:  5  . Carbinoxamine Maleate (RYVENT) 6 MG TABS    Sig: Take 1 tablet by mouth 3 (three) times daily.    Dispense:  60 tablet    Refill:  5    Diagnostics: Spirometry:  Normal with an FEV1 of 109% predicted.  Please see scanned spirometry results for details.    Physical examination: Blood pressure 118/68, pulse 75, temperature 98.1 F (36.7 C), temperature source Oral, resp. rate 16, height 5' 5.5" (1.664 m), weight 142 lb (64.4 kg), SpO2 95 %.  General: Alert, interactive, in no acute distress. HEENT: TMs pearly gray, turbinates mildly edematous without discharge, post-pharynx mildly erythematous. Neck: Supple without lymphadenopathy. Lungs: Clear to auscultation without wheezing, rhonchi or rales. CV: Normal S1, S2 without murmurs. Skin: Warm and dry, without lesions or rashes.  The following portions of the patient's history were reviewed and updated as appropriate: allergies, current medications, past family history, past medical history, past social history, past surgical history and  problem list.  Allergies as of 06/08/2017      Reactions   Amoxil [amoxicillin]    Imuran [azathioprine] Rash   fever    Penicillins Rash      Medication List       Accurate as of 06/08/17  6:50 PM. Always use your most recent med list.          albuterol 108 (90 Base) MCG/ACT inhaler Commonly known as:  PROVENTIL HFA;VENTOLIN HFA Inhale 2 puffs into the lungs every 4 (four) hours as needed for wheezing or shortness of breath.   alendronate 70 MG tablet Commonly known as:  FOSAMAX Take 70 mg by mouth once a week. Take with a full glass of water on an empty stomach.   azaTHIOprine 50 MG tablet Commonly known as:  IMURAN Take two 50 mg tablets (total 100 mg) by mouth once daily.   Biotin 10000 MCG Tabs Take by mouth.   budesonide-formoterol 160-4.5 MCG/ACT inhaler Commonly known as:  SYMBICORT Inhale 2 puffs into the lungs 2 (two) times daily. With spacer   Carbinoxamine Maleate 6 MG Tabs Commonly known as:  RYVENT Take 1 tablet by mouth 3 (three) times daily.   Fluticasone Propionate 93 MCG/ACT Exhu Commonly known as:  XHANCE Place 2 sprays into the nose 2 (two) times daily.   levothyroxine 50 MCG tablet Commonly known as:  SYNTHROID, LEVOTHROID Take 1 tablet (50 mcg total) by mouth daily before breakfast.   pravastatin 40 MG tablet Commonly known as:  PRAVACHOL Take 1 tablet (40 mg total) by mouth daily.   predniSONE 5 MG tablet Commonly known as:  DELTASONE TAKE AS DIRECTED       Allergies  Allergen Reactions  . Amoxil [Amoxicillin]   . Imuran [Azathioprine] Rash    fever  . Penicillins Rash   Review of systems: Review of systems negative except as noted in HPI / PMHx or noted below: Constitutional: Negative.  HENT: Negative.   Eyes: Negative.  Respiratory: Negative.   Cardiovascular: Negative.  Gastrointestinal: Negative.  Genitourinary: Negative.  Musculoskeletal: Negative.  Neurological: Negative.  Endo/Heme/Allergies: Negative.  Cutaneous: Negative.  Past Medical History:  Diagnosis Date  . Gallstone   . Hypothyroidism   . Thyroid nodule     Family  History  Problem Relation Age of Onset  . Hypertension Mother   . Arthritis Mother   . Hypertension Father   . Arthritis Father   . Neuropathy Unknown   . Lymphoma Paternal Uncle   . Cancer Paternal Grandfather   . Stroke Paternal Grandmother     Social History   Social History  . Marital status: Married    Spouse name: N/A  . Number of children: N/A  . Years of education: N/A   Occupational History  .  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 Smoking status: Never Smoker  . Smokeless tobacco: Never Used  . Alcohol use 8.4 oz/week    14 Glasses of wine per week  . Drug use: No  . Sexual activity: Yes    Partners: Male   Other Topics Concern  . Not on file   Social History Narrative   Regular exercise- yes     I appreciate the opportunity to take part in Aariah's care. Please do not hesitate to contact me with questions.  Sincerely,   R. Edgar Frisk, MD

## 2017-06-08 NOTE — Assessment & Plan Note (Addendum)
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   Continue mepolizomab (Nucala) 300 mg every 28 days as prescribed.  Continue prednisone, with slow/gradual tapering to the lowest effective dose.  Follow up with rheumatologist, Dr. Trudie Reed, as directed.  [place]'s bone density is being followed.  Continue Fosamax as prescribed as well as calcium/vit D supplementation.

## 2017-06-13 ENCOUNTER — Other Ambulatory Visit: Payer: Self-pay | Admitting: Allergy

## 2017-06-13 DIAGNOSIS — J3089 Other allergic rhinitis: Secondary | ICD-10-CM

## 2017-06-13 MED ORDER — CARBINOXAMINE MALEATE 6 MG PO TABS: 1.0000 | ORAL_TABLET | Freq: Three times a day (TID) | ORAL | 5 refills | Status: DC

## 2017-06-16 ENCOUNTER — Telehealth: Payer: Self-pay | Admitting: *Deleted

## 2017-06-16 MED ORDER — PREDNISONE 5 MG PO TABS: ORAL_TABLET | ORAL | 3 refills | Status: DC

## 2017-06-16 NOTE — Telephone Encounter (Signed)
Pt out of pred refills, per dr bobbitt ok to refill. She takes 15 mg per day, [facility].

## 2017-07-04 DIAGNOSIS — M301 Polyarteritis with lung involvement [Churg-Strauss]: Secondary | ICD-10-CM | POA: Diagnosis not present

## 2017-07-06 ENCOUNTER — Ambulatory Visit (INDEPENDENT_AMBULATORY_CARE_PROVIDER_SITE_OTHER): Payer: BLUE CROSS/BLUE SHIELD

## 2017-07-06 DIAGNOSIS — M301 Polyarteritis with lung involvement [Churg-Strauss]: Secondary | ICD-10-CM | POA: Diagnosis not present

## 2017-07-06 DIAGNOSIS — D7218 Eosinophilia in diseases classified elsewhere: Secondary | ICD-10-CM

## 2017-07-14 DIAGNOSIS — M301 Polyarteritis with lung involvement [Churg-Strauss]: Secondary | ICD-10-CM | POA: Diagnosis not present

## 2017-07-14 DIAGNOSIS — R0602 Shortness of breath: Secondary | ICD-10-CM | POA: Diagnosis not present

## 2017-07-14 DIAGNOSIS — R0981 Nasal congestion: Secondary | ICD-10-CM | POA: Diagnosis not present

## 2017-07-14 DIAGNOSIS — M150 Primary generalized (osteo)arthritis: Secondary | ICD-10-CM | POA: Diagnosis not present

## 2017-07-29 ENCOUNTER — Other Ambulatory Visit: Payer: Self-pay | Admitting: Allergy and Immunology

## 2017-08-01 DIAGNOSIS — M301 Polyarteritis with lung involvement [Churg-Strauss]: Secondary | ICD-10-CM | POA: Diagnosis not present

## 2017-08-03 ENCOUNTER — Ambulatory Visit (INDEPENDENT_AMBULATORY_CARE_PROVIDER_SITE_OTHER): Payer: BLUE CROSS/BLUE SHIELD

## 2017-08-03 DIAGNOSIS — J4540 Moderate persistent asthma, uncomplicated: Secondary | ICD-10-CM

## 2017-08-03 DIAGNOSIS — M301 Polyarteritis with lung involvement [Churg-Strauss]: Secondary | ICD-10-CM | POA: Diagnosis not present

## 2017-08-08 ENCOUNTER — Other Ambulatory Visit: Payer: Self-pay | Admitting: Family Medicine

## 2017-08-31 ENCOUNTER — Ambulatory Visit: Payer: Self-pay

## 2017-08-31 DIAGNOSIS — M301 Polyarteritis with lung involvement [Churg-Strauss]: Secondary | ICD-10-CM | POA: Diagnosis not present

## 2017-09-01 ENCOUNTER — Ambulatory Visit (INDEPENDENT_AMBULATORY_CARE_PROVIDER_SITE_OTHER): Payer: BLUE CROSS/BLUE SHIELD

## 2017-09-01 DIAGNOSIS — M301 Polyarteritis with lung involvement [Churg-Strauss]: Secondary | ICD-10-CM

## 2017-09-04 ENCOUNTER — Other Ambulatory Visit: Payer: Self-pay | Admitting: Allergy and Immunology

## 2017-09-22 NOTE — Telephone Encounter (Signed)
ERROR

## 2017-09-26 ENCOUNTER — Other Ambulatory Visit: Payer: Self-pay | Admitting: Family Medicine

## 2017-09-28 DIAGNOSIS — M301 Polyarteritis with lung involvement [Churg-Strauss]: Secondary | ICD-10-CM | POA: Diagnosis not present

## 2017-09-29 ENCOUNTER — Ambulatory Visit: Payer: Self-pay

## 2017-09-29 ENCOUNTER — Ambulatory Visit (INDEPENDENT_AMBULATORY_CARE_PROVIDER_SITE_OTHER): Payer: BLUE CROSS/BLUE SHIELD

## 2017-09-29 DIAGNOSIS — J4550 Severe persistent asthma, uncomplicated: Secondary | ICD-10-CM

## 2017-09-29 DIAGNOSIS — M301 Polyarteritis with lung involvement [Churg-Strauss]: Secondary | ICD-10-CM | POA: Diagnosis not present

## 2017-10-24 DIAGNOSIS — M301 Polyarteritis with lung involvement [Churg-Strauss]: Secondary | ICD-10-CM | POA: Diagnosis not present

## 2017-10-27 ENCOUNTER — Ambulatory Visit (INDEPENDENT_AMBULATORY_CARE_PROVIDER_SITE_OTHER): Payer: BLUE CROSS/BLUE SHIELD

## 2017-10-27 DIAGNOSIS — M301 Polyarteritis with lung involvement [Churg-Strauss]: Secondary | ICD-10-CM

## 2017-10-27 DIAGNOSIS — J4540 Moderate persistent asthma, uncomplicated: Secondary | ICD-10-CM

## 2017-11-10 ENCOUNTER — Other Ambulatory Visit: Payer: Self-pay | Admitting: Emergency Medicine

## 2017-11-10 MED ORDER — PRAVASTATIN SODIUM 40 MG PO TABS: 40.0000 mg | ORAL_TABLET | Freq: Every day | ORAL | 0 refills | Status: DC

## 2017-11-18 ENCOUNTER — Telehealth: Payer: Self-pay

## 2017-11-18 MED ORDER — BUDESONIDE-FORMOTEROL FUMARATE 160-4.5 MCG/ACT IN AERO: INHALATION_SPRAY | RESPIRATORY_TRACT | 0 refills | Status: DC

## 2017-11-18 NOTE — Telephone Encounter (Signed)
Received fax for refill for Symbicort 160. Patient was last seen 06/08/2017 and was to return in 5 months. Courtesy refill sent in patient will need OV for further refills.

## 2017-11-21 DIAGNOSIS — M301 Polyarteritis with lung involvement [Churg-Strauss]: Secondary | ICD-10-CM | POA: Diagnosis not present

## 2017-11-23 ENCOUNTER — Ambulatory Visit (INDEPENDENT_AMBULATORY_CARE_PROVIDER_SITE_OTHER): Payer: BLUE CROSS/BLUE SHIELD | Admitting: *Deleted

## 2017-11-23 DIAGNOSIS — J4540 Moderate persistent asthma, uncomplicated: Secondary | ICD-10-CM | POA: Diagnosis not present

## 2017-12-19 DIAGNOSIS — M301 Polyarteritis with lung involvement [Churg-Strauss]: Secondary | ICD-10-CM | POA: Diagnosis not present

## 2017-12-19 DIAGNOSIS — Z1231 Encounter for screening mammogram for malignant neoplasm of breast: Secondary | ICD-10-CM | POA: Diagnosis not present

## 2017-12-19 DIAGNOSIS — Z6823 Body mass index (BMI) 23.0-23.9, adult: Secondary | ICD-10-CM | POA: Diagnosis not present

## 2017-12-19 DIAGNOSIS — Z01419 Encounter for gynecological examination (general) (routine) without abnormal findings: Secondary | ICD-10-CM | POA: Diagnosis not present

## 2017-12-21 ENCOUNTER — Other Ambulatory Visit: Payer: Self-pay | Admitting: Allergy and Immunology

## 2017-12-21 ENCOUNTER — Other Ambulatory Visit: Payer: Self-pay | Admitting: Allergy

## 2017-12-21 ENCOUNTER — Ambulatory Visit (INDEPENDENT_AMBULATORY_CARE_PROVIDER_SITE_OTHER): Payer: BLUE CROSS/BLUE SHIELD | Admitting: *Deleted

## 2017-12-21 DIAGNOSIS — M301 Polyarteritis with lung involvement [Churg-Strauss]: Secondary | ICD-10-CM

## 2017-12-26 ENCOUNTER — Telehealth: Payer: Self-pay | Admitting: Family Medicine

## 2017-12-26 DIAGNOSIS — Z0000 Encounter for general adult medical examination without abnormal findings: Secondary | ICD-10-CM

## 2017-12-26 NOTE — Telephone Encounter (Signed)
Please call her back- if she was immunized as a child there is generally no cause for alarm.  We are glad to do a titer (blood test to look for proof of immunity) for her if she likes, but this is not routinely necessary

## 2017-12-26 NOTE — Telephone Encounter (Signed)
Copied from [place] [id number]. Topic: Quick Communication - See Telephone Encounter >> Dec 26, 2017 10:44 AM Cleaster Corin, NT wrote: CRM for notification. See Telephone encounter for: 12/26/17.  Pt. Calling to ask if the measles vaccine is still in her system she stated that it was something on the news that said it  may not be active anymore. Pt would like to speak with nurse [phone]

## 2017-12-27 NOTE — Telephone Encounter (Signed)
I have scheduled and order patients lab for 12/30/17 per patient request.

## 2017-12-29 DIAGNOSIS — Z01419 Encounter for gynecological examination (general) (routine) without abnormal findings: Secondary | ICD-10-CM | POA: Diagnosis not present

## 2017-12-29 DIAGNOSIS — Z1231 Encounter for screening mammogram for malignant neoplasm of breast: Secondary | ICD-10-CM | POA: Diagnosis not present

## 2017-12-29 DIAGNOSIS — Z6823 Body mass index (BMI) 23.0-23.9, adult: Secondary | ICD-10-CM | POA: Diagnosis not present

## 2017-12-30 ENCOUNTER — Other Ambulatory Visit (INDEPENDENT_AMBULATORY_CARE_PROVIDER_SITE_OTHER): Payer: BLUE CROSS/BLUE SHIELD

## 2017-12-30 DIAGNOSIS — Z0000 Encounter for general adult medical examination without abnormal findings: Secondary | ICD-10-CM

## 2018-01-02 LAB — MEASLES/MUMPS/RUBELLA IMMUNITY
Mumps IgG: >300 AU/mL
Rubeola IgG: >300 AU/mL

## 2018-01-16 DIAGNOSIS — M301 Polyarteritis with lung involvement [Churg-Strauss]: Secondary | ICD-10-CM | POA: Diagnosis not present

## 2018-01-18 ENCOUNTER — Ambulatory Visit (INDEPENDENT_AMBULATORY_CARE_PROVIDER_SITE_OTHER): Payer: BLUE CROSS/BLUE SHIELD

## 2018-01-18 DIAGNOSIS — D7218 Eosinophilia in diseases classified elsewhere: Secondary | ICD-10-CM

## 2018-01-18 DIAGNOSIS — M301 Polyarteritis with lung involvement [Churg-Strauss]: Secondary | ICD-10-CM | POA: Diagnosis not present

## 2018-01-26 DIAGNOSIS — M818 Other osteoporosis without current pathological fracture: Secondary | ICD-10-CM | POA: Diagnosis not present

## 2018-01-26 DIAGNOSIS — Z79899 Other long term (current) drug therapy: Secondary | ICD-10-CM | POA: Diagnosis not present

## 2018-01-26 DIAGNOSIS — M150 Primary generalized (osteo)arthritis: Secondary | ICD-10-CM | POA: Diagnosis not present

## 2018-01-26 DIAGNOSIS — M301 Polyarteritis with lung involvement [Churg-Strauss]: Secondary | ICD-10-CM | POA: Diagnosis not present

## 2018-02-13 DIAGNOSIS — M301 Polyarteritis with lung involvement [Churg-Strauss]: Secondary | ICD-10-CM | POA: Diagnosis not present

## 2018-02-15 ENCOUNTER — Ambulatory Visit (INDEPENDENT_AMBULATORY_CARE_PROVIDER_SITE_OTHER): Payer: BLUE CROSS/BLUE SHIELD

## 2018-02-15 DIAGNOSIS — M301 Polyarteritis with lung involvement [Churg-Strauss]: Secondary | ICD-10-CM

## 2018-02-15 DIAGNOSIS — J4540 Moderate persistent asthma, uncomplicated: Secondary | ICD-10-CM

## 2018-02-20 ENCOUNTER — Other Ambulatory Visit: Payer: Self-pay | Admitting: Allergy

## 2018-02-20 MED ORDER — BUDESONIDE-FORMOTEROL FUMARATE 160-4.5 MCG/ACT IN AERO: INHALATION_SPRAY | RESPIRATORY_TRACT | 2 refills | Status: DC

## 2018-03-14 DIAGNOSIS — M301 Polyarteritis with lung involvement [Churg-Strauss]: Secondary | ICD-10-CM | POA: Diagnosis not present

## 2018-03-15 ENCOUNTER — Ambulatory Visit (INDEPENDENT_AMBULATORY_CARE_PROVIDER_SITE_OTHER): Payer: BLUE CROSS/BLUE SHIELD

## 2018-03-15 DIAGNOSIS — M301 Polyarteritis with lung involvement [Churg-Strauss]: Secondary | ICD-10-CM | POA: Diagnosis not present

## 2018-03-15 DIAGNOSIS — D7218 Eosinophilia in diseases classified elsewhere: Secondary | ICD-10-CM

## 2018-04-06 ENCOUNTER — Encounter: Payer: Self-pay | Admitting: Allergy and Immunology

## 2018-04-06 ENCOUNTER — Telehealth: Payer: Self-pay

## 2018-04-06 ENCOUNTER — Other Ambulatory Visit: Payer: Self-pay

## 2018-04-06 ENCOUNTER — Ambulatory Visit (INDEPENDENT_AMBULATORY_CARE_PROVIDER_SITE_OTHER): Payer: BLUE CROSS/BLUE SHIELD | Admitting: Allergy and Immunology

## 2018-04-06 VITALS — BP 114/78 | HR 86 | Temp 98.3°F | Resp 16 | Ht 63.0 in | Wt 139.0 lb

## 2018-04-06 DIAGNOSIS — M301 Polyarteritis with lung involvement [Churg-Strauss]: Secondary | ICD-10-CM | POA: Diagnosis not present

## 2018-04-06 DIAGNOSIS — J3089 Other allergic rhinitis: Secondary | ICD-10-CM

## 2018-04-06 DIAGNOSIS — J310 Chronic rhinitis: Secondary | ICD-10-CM

## 2018-04-06 DIAGNOSIS — J4540 Moderate persistent asthma, uncomplicated: Secondary | ICD-10-CM

## 2018-04-06 DIAGNOSIS — D7218 Eosinophilia in diseases classified elsewhere: Secondary | ICD-10-CM

## 2018-04-06 MED ORDER — FLUTICASONE PROPIONATE 93 MCG/ACT NA EXHU: 2.0000 | INHALANT_SUSPENSION | Freq: Two times a day (BID) | NASAL | 5 refills | Status: DC

## 2018-04-06 MED ORDER — AZELASTINE HCL 0.15 % NA SOLN: 2.0000 | Freq: Two times a day (BID) | NASAL | 5 refills | Status: DC

## 2018-04-06 MED ORDER — TIOTROPIUM BROMIDE MONOHYDRATE 2.5 MCG/ACT IN AERS: 2.0000 | INHALATION_SPRAY | RESPIRATORY_TRACT | 5 refills | Status: DC

## 2018-04-06 MED ORDER — ALBUTEROL SULFATE 108 (90 BASE) MCG/ACT IN AEPB: 2.0000 | INHALATION_SPRAY | RESPIRATORY_TRACT | 2 refills | Status: DC | PRN

## 2018-04-06 NOTE — Telephone Encounter (Signed)
-----   Message from Adelina Mings, MD sent at 04/06/2018  5:26 PM EDT ----- Please check and see why the patient is having to pay $100 per month for [facility] when she has [employer] and has failed other nasal sprays. Thanks.

## 2018-04-06 NOTE — Telephone Encounter (Signed)
Noted. Thanks.

## 2018-04-06 NOTE — Progress Notes (Signed)
Follow-up Note  RE: Judy Ramos MRN: [id number] DOB: 11-29-1960 Date of Office Visit: 04/06/2018  Primary care provider: Darreld Mclean, MD Referring provider: Darreld Mclean, MD  History of present illness: Judy Ramos is a 57 y.o. 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  She was last seen in this clinic in October 2018.  Overall, her upper and lower respiratory symptoms have improved while receiving Nucala 300 mg every 4 weeks.  She has been able to decrease prednisone use to 5.5 to 7 mg daily.  However, at this dose she still is experiencing nasal/sinus congestion as well as coughing, chest tightness, and occasional wheezing.  She is experiencing the chest tightness and dyspnea daily.  She is currently only using Xhance nasal spray or Flonase as needed.  She is taking Symbicort 160-4.5 g, 2 inhalations twice daily, though admits that she has not been using a spacer device.  Assessment and plan: Moderate persistent asthma Currently with suboptimal control.  A prescription has been provided for Spiriva Respimat 1.25 g, 2 inhalations daily.  Continue Symbicort 160-4.5 g, 2 inhalations twice daily.  To maximize pulmonary deposition, a spacer has been provided along with instructions for its proper administration with an HFA inhaler.  A prescription has been provided for ProAir Respiclick, 1-2 inhalations every 4-6 hours as needed.  Continue mepolizomab injections as prescribed.  The patient has been asked to contact me if her symptoms persist or progress. Otherwise, she may return for follow up in 4 months.  Chronic rhinitis Poorly controlled.  I have recommended using Xhance, 2 sprays per nostril twice daily for now.  A prescription has been provided for azelastine nasal spray, 1-2 sprays per nostril 1-2 times daily as needed.   Nasal saline spray (i.e., Simply Saline) or nasal saline lavage (i.e., NeilMed) is recommended as needed and prior to medicated  nasal sprays.  Continue RyVent (carbinoxamine maleate) 6mg  every 6-8 hours as needed.  EGPA  Treatment plan as outlined above.  Continue to titrate prednisone dose according to symptoms.   Meds ordered this encounter  Medications  . Albuterol Sulfate (PROAIR RESPICLICK) 532 (90 Base) MCG/ACT AEPB    Sig: Inhale 2 puffs into the lungs every 4 (four) hours as needed.    Dispense:  1 each    Refill:  2  . Tiotropium Bromide Monohydrate (SPIRIVA RESPIMAT) 2.5 MCG/ACT AERS    Sig: Inhale 2 puffs into the lungs 1 day or 1 dose.    Dispense:  1 Inhaler    Refill:  5  . Azelastine HCl 0.15 % SOLN    Sig: Place 2 sprays into both nostrils 2 (two) times daily.    Dispense:  30 mL    Refill:  5    Diagnostics: Prematurity reveals an FVC of 3.66 L and an FEV1 of 2.69 L (92% predicted).  Please see scanned spirometry results for details.    Physical examination: Blood pressure 114/78, pulse 86, temperature 98.3 F (36.8 C), temperature source Oral, resp. rate 16, height 5\' 3"  (1.6 m), weight 139 lb (63 kg), SpO2 99 %.  General: Alert, interactive, in no acute distress. HEENT: TMs pearly gray, turbinates moderately edematous without discharge, post-pharynx mildly erythematous. Neck: Supple without lymphadenopathy. Lungs: Clear to auscultation without wheezing, rhonchi or rales. CV: Normal S1, S2 without murmurs. Skin: Warm and dry, without lesions or rashes.  The following portions of the patient's history were reviewed and updated as appropriate: allergies, current medications, past family history, past  medical history, past social history, past surgical history and problem list.  Allergies as of 04/06/2018      Reactions   Amoxil [amoxicillin]    Imuran [azathioprine] Rash   fever   Penicillins Rash      Medication List        Accurate as of 04/06/18  5:31 PM. Always use your most recent med list.          Albuterol Sulfate 108 (90 Base) MCG/ACT Aepb Inhale 2 puffs into  the lungs every 4 (four) hours as needed.   alendronate 70 MG tablet Commonly known as:  FOSAMAX Take 70 mg by mouth once a week. Take with a full glass of water on an empty stomach.   Azelastine HCl 0.15 % Soln Place 2 sprays into both nostrils 2 (two) times daily.   budesonide-formoterol 160-4.5 MCG/ACT inhaler Commonly known as:  SYMBICORT INHALE TWO PUFFS TWICE DAILY TO PREVENT COUGH OR WHEEZE. RINSE MOUTH AFTER USE.   Collagenase Powd 1 Scoop by Does not apply route daily.   Fluticasone Propionate 93 MCG/ACT Exhu Place 2 sprays into the nose 2 (two) times daily.   levothyroxine 50 MCG tablet Commonly known as:  SYNTHROID, LEVOTHROID TAKE 1 TABLET (50 MCG TOTAL) BY MOUTH DAILY BEFORE BREAKFAST.   pravastatin 40 MG tablet Commonly known as:  PRAVACHOL Take 1 tablet (40 mg total) by mouth daily.   predniSONE 1 MG tablet Commonly known as:  DELTASONE Take 0.5 mg by mouth daily with breakfast.   predniSONE 5 MG tablet Commonly known as:  DELTASONE TAKE 3 TABLETS BY MOUTH EVERY DAY AS DIRECTED   Tiotropium Bromide Monohydrate 2.5 MCG/ACT Aers Inhale 2 puffs into the lungs 1 day or 1 dose.   VITAMIN D (ERGOCALCIFEROL) PO Take 1 tablet by mouth daily.       Allergies  Allergen Reactions  . Amoxil [Amoxicillin]   . Imuran [Azathioprine] Rash    fever  . Penicillins Rash   Review of systems: Review of systems negative except as noted in HPI / PMHx or noted below: Constitutional: Negative.  HENT: Negative.   Eyes: Negative.  Respiratory: Negative.   Cardiovascular: Negative.  Gastrointestinal: Negative.  Genitourinary: Negative.  Musculoskeletal: Negative.  Neurological: Negative.  Endo/Heme/Allergies: Negative.  Cutaneous: Negative.  Past Medical History:  Diagnosis Date  . Gallstone   . Hypothyroidism   . Thyroid nodule     Family History  Problem Relation Age of Onset  . Hypertension Mother   . Arthritis Mother   . Hypertension Father   .  Arthritis Father   . Neuropathy Unknown   . Lymphoma Paternal Uncle   . Cancer Paternal Grandfather   . Stroke Paternal Grandmother     Social History   Socioeconomic History  . Marital status: Married    Spouse name: Not on file  . Number of children: Not on file  . Years of education: Not on file  . Highest education level: Not on file  Occupational History    Employer: SELF EMPLOYED    Comment: p/t with husband  Social Needs  . Financial resource strain: Not on file  . Food insecurity:    Worry: Not on file    Inability: Not on file  . Transportation needs:    Medical: Not on file    Non-medical: Not on file  Tobacco Use  . Smoking status: Never Smoker  . Smokeless tobacco: Never Used  Substance and Sexual Activity  . Alcohol use: Yes  Alcohol/week: 14.0 standard drinks    Types: 14 Glasses of wine per week  . Drug use: No  . Sexual activity: Yes    Partners: Male  Lifestyle  . Physical activity:    Days per week: Not on file    Minutes per session: Not on file  . Stress: Not on file  Relationships  . Social connections:    Talks on phone: Not on file    Gets together: Not on file    Attends religious service: Not on file    Active member of club or organization: Not on file    Attends meetings of clubs or organizations: Not on file    Relationship status: Not on file  . Intimate partner violence:    Fear of current or ex partner: Not on file    Emotionally abused: Not on file    Physically abused: Not on file    Forced sexual activity: Not on file  Other Topics Concern  . Not on file  Social History Narrative   Regular exercise- yes     I appreciate the opportunity to take part in Deondrea's care. Please do not hesitate to contact me with questions.  Sincerely,   R. Edgar Frisk, MD

## 2018-04-06 NOTE — Patient Instructions (Addendum)
Moderate persistent asthma Currently with suboptimal control.  A prescription has been provided for Spiriva Respimat 1.25 g, 2 inhalations daily.  Continue Symbicort 160-4.5 g, 2 inhalations twice daily.  To maximize pulmonary deposition, a spacer has been provided along with instructions for its proper administration with an HFA inhaler.  A prescription has been provided for ProAir Respiclick, 1-2 inhalations every 4-6 hours as needed.  Continue mepolizomab injections as prescribed.  The patient has been asked to contact me if her symptoms persist or progress. Otherwise, she may return for follow up in 4 months.  Chronic rhinitis Poorly controlled.  I have recommended using Xhance, 2 sprays per nostril twice daily for now.  A prescription has been provided for azelastine nasal spray, 1-2 sprays per nostril 1-2 times daily as needed.   Nasal saline spray (i.e., Simply Saline) or nasal saline lavage (i.e., NeilMed) is recommended as needed and prior to medicated nasal sprays.  Continue RyVent (carbinoxamine maleate) 6mg  every 6-8 hours as needed.  EGPA  Treatment plan as outlined above.  Continue to titrate prednisone dose according to symptoms.   Return in about 4 months (around 08/06/2018), or if symptoms worsen or fail to improve.

## 2018-04-06 NOTE — Assessment & Plan Note (Addendum)
Currently with suboptimal control.  A prescription has been provided for Spiriva Respimat 1.25 g, 2 inhalations daily.  Continue Symbicort 160-4.5 g, 2 inhalations twice daily.  To maximize pulmonary deposition, a spacer has been provided along with instructions for its proper administration with an HFA inhaler.  A prescription has been provided for ProAir Respiclick, 1-2 inhalations every 4-6 hours as needed.  Continue mepolizomab injections as prescribed.  The patient has been asked to contact me if her symptoms persist or progress. Otherwise, she may return for follow up in 4 months.

## 2018-04-06 NOTE — Assessment & Plan Note (Signed)
Poorly controlled.  I have recommended using Xhance, 2 sprays per nostril twice daily for now.  A prescription has been provided for azelastine nasal spray, 1-2 sprays per nostril 1-2 times daily as needed.   Nasal saline spray (i.e., Simply Saline) or nasal saline lavage (i.e., NeilMed) is recommended as needed and prior to medicated nasal sprays.  Continue RyVent (carbinoxamine maleate) 6mg  every 6-8 hours as needed.

## 2018-04-06 NOTE — Telephone Encounter (Signed)
Prescription wa snot sent with necessary information nor to [facility]. The prescription was sent to [facility]. I have sent the prescription to the corrected pharmacy. I also called patient and advised on this as well as what number to be looking for a call from.

## 2018-04-06 NOTE — Assessment & Plan Note (Addendum)
   Treatment plan as outlined above.  Continue to titrate prednisone dose according to symptoms.

## 2018-04-11 ENCOUNTER — Telehealth: Payer: Self-pay

## 2018-04-11 DIAGNOSIS — M301 Polyarteritis with lung involvement [Churg-Strauss]: Secondary | ICD-10-CM | POA: Diagnosis not present

## 2018-04-11 MED ORDER — CARBINOXAMINE MALEATE 6 MG PO TABS: 1.0000 | ORAL_TABLET | Freq: Four times a day (QID) | ORAL | 5 refills | Status: DC

## 2018-04-11 NOTE — Telephone Encounter (Signed)
Pt asked for RF of Ryvent.  Sent in to [facility] & pt notified.

## 2018-04-12 ENCOUNTER — Ambulatory Visit (INDEPENDENT_AMBULATORY_CARE_PROVIDER_SITE_OTHER): Payer: BLUE CROSS/BLUE SHIELD

## 2018-04-12 DIAGNOSIS — M301 Polyarteritis with lung involvement [Churg-Strauss]: Secondary | ICD-10-CM | POA: Diagnosis not present

## 2018-04-12 DIAGNOSIS — D7218 Eosinophilia in diseases classified elsewhere: Secondary | ICD-10-CM

## 2018-04-17 ENCOUNTER — Emergency Department (HOSPITAL_BASED_OUTPATIENT_CLINIC_OR_DEPARTMENT_OTHER)
Admission: EM | Admit: 2018-04-17 | Discharge: 2018-04-17 | Disposition: A | Discharge status: Home / Self Care | Payer: BLUE CROSS/BLUE SHIELD | Attending: Emergency Medicine | Admitting: Emergency Medicine

## 2018-04-17 ENCOUNTER — Other Ambulatory Visit: Payer: Self-pay

## 2018-04-17 ENCOUNTER — Encounter (HOSPITAL_BASED_OUTPATIENT_CLINIC_OR_DEPARTMENT_OTHER): Payer: Self-pay | Admitting: *Deleted

## 2018-04-17 ENCOUNTER — Telehealth: Payer: Self-pay | Admitting: Allergy

## 2018-04-17 DIAGNOSIS — M301 Polyarteritis with lung involvement [Churg-Strauss]: Secondary | ICD-10-CM | POA: Diagnosis not present

## 2018-04-17 DIAGNOSIS — Z79899 Other long term (current) drug therapy: Secondary | ICD-10-CM | POA: Insufficient documentation

## 2018-04-17 DIAGNOSIS — E039 Hypothyroidism, unspecified: Secondary | ICD-10-CM | POA: Diagnosis not present

## 2018-04-17 DIAGNOSIS — J45909 Unspecified asthma, uncomplicated: Secondary | ICD-10-CM | POA: Insufficient documentation

## 2018-04-17 DIAGNOSIS — R220 Localized swelling, mass and lump, head: Secondary | ICD-10-CM | POA: Diagnosis not present

## 2018-04-17 DIAGNOSIS — T7840XA Allergy, unspecified, initial encounter: Secondary | ICD-10-CM | POA: Insufficient documentation

## 2018-04-17 MED ORDER — PREDNISONE 20 MG PO TABS: 40.0000 mg | ORAL_TABLET | Freq: Every day | ORAL | 0 refills | Status: DC

## 2018-04-17 MED ORDER — EPINEPHRINE 0.3 MG/0.3ML IJ SOAJ: 0.3000 mg | Freq: Once | INTRAMUSCULAR | 0 refills | Status: AC

## 2018-04-17 NOTE — Telephone Encounter (Signed)
Judy Ramos called and said she has had tongue and face swelling for about three days.Called [facility] and talked to [place] and he talked to Dr. Verlin Fester and he said for her to go to ED. Informed patient of message

## 2018-04-17 NOTE — ED Triage Notes (Signed)
Pt sent here by Allergist for tongue swelling x 3 days, no resp distress noted

## 2018-04-17 NOTE — Telephone Encounter (Signed)
I was told by Garlon Hatchet that patient was "having an anaphylactic reaction." That is why I recommended evaluation at the ER.

## 2018-04-17 NOTE — ED Notes (Signed)
ED Provider at bedside. 

## 2018-04-17 NOTE — Discharge Instructions (Addendum)
Continue to take Benadryl.  I would recommend you taking increased dose of prednisone, 40 mg daily for the next 3 days.  Stop taking all of the new medications.  Please follow-up with your allergist to discuss what medications they would want to to restart.  Return to emergency department if worsening symptoms.  Use EpiPen if any worsening in swelling of your tongue, throat, difficulty breathing

## 2018-04-17 NOTE — Telephone Encounter (Signed)
error 

## 2018-04-17 NOTE — ED Provider Notes (Signed)
[facility] EMERGENCY DEPARTMENT Provider Note   CSN: [id number] Arrival date & time: 04/17/18  1455     History   Chief Complaint Chief Complaint  Patient presents with  . Facial Swelling    HPI Judy Ramos is a 57 y.o. female.  HPI Judy Ramos is a 57 y.o. female with history of eosinophilic granulomatosis with polyangiitis, on chronic steroids, presents to emergency department complaining of tongue swelling.  Patient states that her allergist is working on getting her off of prednisone which she has taken for 5 years straight.  She states they just started her on some intranasal sprays and Spiriva for her breathing and chronic sinus infections.  She states she has been taking these medications for about 1 week.  She states that about 3 days ago she has noticed that her tongue started to feel more dry and thick.  She denies any difficulty breathing or swallowing.  She states that her symptoms have not gotten worse in the last 3 days.  She has tried taking Benadryl at nighttime but it did not help.  She is currently down to 5 mg of prednisone daily.  She called her allergist today and they told her to come here for evaluation of her swollen tongue.  Past Medical History:  Diagnosis Date  . Gallstone   . Hypothyroidism   . Thyroid nodule     Patient Active Problem List   Diagnosis Date Noted  . Osteoporosis 09/02/2016  . Eosinophilia 04/26/2016  . Chronic rhinitis 04/26/2016  . Family history of colonic polyps 02/02/2016  . 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 Asthmatic bronchitis 01/05/2016  . Hyperlipidemia 01/05/2016  . Moderate persistent asthma 11/13/2015  . Atelectasis RUL 11/13/2015  . Perennial allergic rhinitis with a nonallergic component 11/10/2015  . Persistent cough 11/10/2015  . Esophageal reflux 11/10/2015  . Chronic maxillary sinusitis 11/10/2015  . History of asthma 11/10/2015  . Acute sinusitis treated with antibiotics in the past 60 days 10/22/2013  . Rash  12/23/2010  . IRREGULAR MENSES 01/20/2010  . VARICOSE VEINS LOWER EXTREMITIES W/INFLAMMATION 10/16/2009  . CERVICAL POLYP 10/16/2009  . INSOMNIA 09/05/2008  . Hypothyroid 10/23/2007  . THYROID NODULE 02/08/2007    Past Surgical History:  Procedure Laterality Date  . BREAST ENHANCEMENT SURGERY  2001  . CESAREAN SECTION     x 2  . CHOLECYSTECTOMY  05/1999  . DILATION AND CURETTAGE OF UTERUS  2011  . LASIK    . uterine ablation  2011  . VEIN LIGATION AND STRIPPING    . VIDEO BRONCHOSCOPY Bilateral 12/10/2015   Procedure: VIDEO BRONCHOSCOPY WITHOUT FLUORO;  Surgeon: Tanda Rockers, MD;  Location: [facility] ENDOSCOPY;  Service: Endoscopy;  Laterality: Bilateral;     OB History   None      Home Medications    Prior to Admission medications   Medication Sig Start Date End Date Taking? Authorizing Provider  Albuterol Sulfate (PROAIR RESPICLICK) 283 (90 Base) MCG/ACT AEPB Inhale 2 puffs into the lungs every 4 (four) hours as needed. 04/06/18   Bobbitt, Sedalia Muta, MD  alendronate (FOSAMAX) 70 MG tablet Take 70 mg by mouth once a week. Take with a full glass of water on an empty stomach.    [provider]  Azelastine HCl 0.15 % SOLN Place 2 sprays into both nostrils 2 (two) times daily. 04/06/18   Bobbitt, Sedalia Muta, MD  budesonide-formoterol (SYMBICORT) 160-4.5 MCG/ACT inhaler INHALE TWO PUFFS TWICE DAILY TO PREVENT COUGH OR WHEEZE. RINSE MOUTH AFTER  USE. 02/20/18   Bobbitt, Sedalia Muta, MD  Carbinoxamine Maleate (RYVENT) 6 MG TABS Take 1 tablet by mouth every 6 (six) hours. 04/11/18   Charlies Silvers, MD  Collagenase POWD 1 Scoop by Does not apply route daily.    [provider]  Fluticasone Propionate (XHANCE) 93 MCG/ACT EXHU Place 2 sprays into the nose 2 (two) times daily. 04/06/18   Bobbitt, Sedalia Muta, MD  levothyroxine (SYNTHROID, LEVOTHROID) 50 MCG tablet TAKE 1 TABLET (50 MCG TOTAL) BY MOUTH DAILY BEFORE BREAKFAST. 09/26/17   Copland, Gay Filler, MD  pravastatin  (PRAVACHOL) 40 MG tablet Take 1 tablet (40 mg total) by mouth daily. 11/10/17   Copland, Gay Filler, MD  predniSONE (DELTASONE) 1 MG tablet Take 0.5 mg by mouth daily with breakfast.    [provider]  predniSONE (DELTASONE) 5 MG tablet TAKE 3 TABLETS BY MOUTH EVERY DAY AS DIRECTED Patient taking differently: Take 5 mg by mouth daily with breakfast. Total of 5.5 mg daily.  5 mg tab plus half tab of 1 mg tab total. 09/05/17   Bobbitt, Sedalia Muta, MD  Tiotropium Bromide Monohydrate (SPIRIVA RESPIMAT) 2.5 MCG/ACT AERS Inhale 2 puffs into the lungs 1 day or 1 dose. 04/06/18   Bobbitt, Sedalia Muta, MD  VITAMIN D, ERGOCALCIFEROL, PO Take 1 tablet by mouth daily.    [provider]    Family History Family History  Problem Relation Age of Onset  . Hypertension Mother   . Arthritis Mother   . Hypertension Father   . Arthritis Father   . Neuropathy Unknown   . Lymphoma Paternal Uncle   . Cancer Paternal Grandfather   . Stroke Paternal Grandmother     Social History Social History   Tobacco Use  . Smoking status: Never Smoker  . Smokeless tobacco: Never Used  Substance Use Topics  . Alcohol use: Yes    Alcohol/week: 14.0 standard drinks    Types: 14 Glasses of wine per week  . Drug use: No     Allergies   Amoxil [amoxicillin]; Imuran [azathioprine]; and Penicillins   Review of Systems Review of Systems  Constitutional: Negative for chills and fever.  HENT: Positive for facial swelling. Negative for sore throat, trouble swallowing and voice change.   Respiratory: Negative for cough, chest tightness and shortness of breath.   Cardiovascular: Negative for chest pain, palpitations and leg swelling.  Gastrointestinal: Negative for abdominal pain, diarrhea, nausea and vomiting.  Genitourinary: Negative for dysuria, flank pain, pelvic pain, vaginal bleeding, vaginal discharge and vaginal pain.  Musculoskeletal: Negative for arthralgias, myalgias, neck pain and neck  stiffness.  Skin: Negative for rash.  Neurological: Negative for dizziness, weakness and headaches.  All other systems reviewed and are negative.    Physical Exam Updated Vital Signs BP (!) 137/91   Pulse 80   Temp 98.3 F (36.8 C) (Oral)   Resp 16   Ht 5\' 6"  (1.676 m)   Wt 62.6 kg   LMP 08/23/2016   SpO2 98%   BMI 22.27 kg/m   Physical Exam  Constitutional: She appears well-developed and well-nourished. No distress.  HENT:  Head: Normocephalic.  Normal-appearing tongue.  No swelling under the tongue.  Normal uvula with no edema.  Oropharynx is normal.  No obvious facial swelling.  Eyes: Conjunctivae are normal.  Neck: Neck supple.  Cardiovascular: Normal rate, regular rhythm and normal heart sounds.  Pulmonary/Chest: Effort normal and breath sounds normal. No respiratory distress. She has no wheezes. She has no  rales.  Abdominal: Soft. Bowel sounds are normal. She exhibits no distension. There is no tenderness. There is no rebound.  Musculoskeletal: She exhibits no edema.  Neurological: She is alert.  Skin: Skin is warm and dry. No rash noted.  Psychiatric: She has a normal mood and affect. Her behavior is normal.  Nursing note and vitals reviewed.    ED Treatments / Results  Labs (all labs ordered are listed, but only abnormal results are displayed) Labs Reviewed - No data to display  EKG None  Radiology No results found.  Procedures Procedures (including critical care time)  Medications Ordered in ED Medications - No data to display   Initial Impression / Assessment and Plan / ED Course  I have reviewed the triage vital signs and the nursing notes.  Pertinent labs & imaging results that were available during my care of the patient were reviewed by me and considered in my medical decision making (see chart for details).     Urgency department with 3-day history of feeling like her tongue is swollen.  On the exam, I do not see any obvious signs of  angioedema, no tongue swelling, no lip swelling, uvula is normal.  Oropharynx is normal.  No stridor.  Lungs are clear.  There is no rashes or hives.  I suspect her symptoms are most likely side effect of the new medications, however I am not sure which medicine would be causing her symptoms.  Discussed with Dr. Sherry Ruffing.  We will stop all of her new medications for now.  Advised to burst 40 mg of prednisone for the next 3 days to help with the tongue swelling.  Continue Benadryl.  Follow-up with allergist to discuss further medications changes.  Patient is agreeable to the plan.  Given that she has had 3 days of this swollen sensation with no worsening in symptoms, no shortness of breath, no other concerning symptoms, she is stable for discharge home.  Will have a follow-up as soon as possible.  We did discuss strict return precautions and I will give her a prescription for EpiPen.   Vitals:   04/17/18 1458 04/17/18 1501  BP:  (!) 137/91  Pulse:  80  Resp:  16  Temp:  98.3 F (36.8 C)  TempSrc:  Oral  SpO2:  98%  Weight: 62.6 kg   Height: 5\' 6"  (1.676 m)     Final Clinical Impressions(s) / ED Diagnoses   Final diagnoses:  Allergic reaction, initial encounter    ED Discharge Orders         Ordered    EPINEPHrine (EPIPEN 2-PAK) 0.3 mg/0.3 mL IJ SOAJ injection   Once     04/17/18 1642           Jeannett Senior, PA-C 04/17/18 1643    Tegeler, Gwenyth Allegra, MD 04/18/18 [phone]

## 2018-04-19 ENCOUNTER — Telehealth: Payer: Self-pay | Admitting: Allergy

## 2018-04-19 NOTE — Telephone Encounter (Signed)
na

## 2018-04-19 NOTE — Telephone Encounter (Signed)
Talked with Judy Ramos.Said she went to ED yesterday. They gave her prednisone  40mg  for three days and told her to take Benadryl at. Night. Patient said swelling has come down some. They also told her to stop on the new medicine you had put her on. Patient would like for you to call her.Cell number [phone]

## 2018-04-19 NOTE — Telephone Encounter (Signed)
I tried to call but there was no answer and it went to voice mail. I asked her to call me back at the office.

## 2018-04-20 NOTE — Telephone Encounter (Signed)
Patient returning Dr. Barnett Hatter call.  Gave Dr. Verlin Fester patients phone number where she can be reached at today.  [phone].  Routed back to Dr. Verlin Fester.

## 2018-04-20 NOTE — Telephone Encounter (Signed)
I called and spoke with the patient on the telephone.  The lip and tongue swelling apparently was pretty mild but has improved since she was put on higher dose of prednisone.  She did not have concomitant urticaria, cardiopulmonary symptoms, or other GI symptoms.  When this happened, she discontinued Xhance, azelastine nasal spray, and Spiriva.  She had been on Xhance in the past without problems.  I told her to reintroduce [facility] and, if there are no problems, then reintroduce azelastine nasal spray.  For now she will continue to avoid Spiriva.  I asked her to call if she has any other problems or issues and she agreed to do so.

## 2018-05-10 DIAGNOSIS — M301 Polyarteritis with lung involvement [Churg-Strauss]: Secondary | ICD-10-CM | POA: Diagnosis not present

## 2018-05-11 ENCOUNTER — Ambulatory Visit (INDEPENDENT_AMBULATORY_CARE_PROVIDER_SITE_OTHER): Payer: BLUE CROSS/BLUE SHIELD | Admitting: *Deleted

## 2018-05-11 DIAGNOSIS — J309 Allergic rhinitis, unspecified: Secondary | ICD-10-CM

## 2018-05-11 DIAGNOSIS — M301 Polyarteritis with lung involvement [Churg-Strauss]: Secondary | ICD-10-CM

## 2018-05-11 DIAGNOSIS — J4550 Severe persistent asthma, uncomplicated: Secondary | ICD-10-CM

## 2018-05-12 ENCOUNTER — Other Ambulatory Visit: Payer: Self-pay | Admitting: *Deleted

## 2018-05-12 DIAGNOSIS — J3089 Other allergic rhinitis: Secondary | ICD-10-CM

## 2018-05-12 MED ORDER — FLUTICASONE PROPIONATE 93 MCG/ACT NA EXHU: 2.0000 | INHALANT_SUSPENSION | Freq: Two times a day (BID) | NASAL | 12 refills | Status: DC

## 2018-05-12 NOTE — Telephone Encounter (Signed)
Pt stated her xhance was costing her $100 after meeting her deductible. I resent script with 12 refills so she could opt into 12 month auto refills to see if that makes any difference on the price. I attempted to call [facility] but was on hold for 20 minutes, will try again.

## 2018-05-15 ENCOUNTER — Other Ambulatory Visit: Payer: Self-pay | Admitting: Allergy

## 2018-05-15 NOTE — Telephone Encounter (Signed)
Spoke with [facility] and xhance was not covered but we can do a non-formulary exception form- I am working on that now.

## 2018-05-18 NOTE — Telephone Encounter (Signed)
Spoke with insurance company her xhance had been approved. Will inform pt.

## 2018-06-02 ENCOUNTER — Other Ambulatory Visit: Payer: Self-pay | Admitting: Allergy and Immunology

## 2018-06-06 ENCOUNTER — Ambulatory Visit (INDEPENDENT_AMBULATORY_CARE_PROVIDER_SITE_OTHER): Payer: BLUE CROSS/BLUE SHIELD | Admitting: Family

## 2018-06-06 ENCOUNTER — Encounter: Payer: Self-pay | Admitting: Family

## 2018-06-06 VITALS — BP 118/68 | HR 71 | Temp 98.4°F | Resp 16 | Ht 63.0 in | Wt 137.0 lb

## 2018-06-06 DIAGNOSIS — M79644 Pain in right finger(s): Secondary | ICD-10-CM

## 2018-06-06 MED ORDER — MELOXICAM 7.5 MG PO TABS: 7.5000 mg | ORAL_TABLET | Freq: Every day | ORAL | 0 refills | Status: DC

## 2018-06-06 NOTE — Patient Instructions (Addendum)
Begin meloxicam once daily for pain. You should be contacted about your referral to sports medicine

## 2018-06-06 NOTE — Progress Notes (Signed)
Subjective:    Patient ID: Judy Ramos, female    DOB: 1961-01-12, 57 y.o.   MRN: [id number]  HPI  Patient is a 57 yr old female who presents today with chief complaint of pain at the base of the right thumb.  Pain is worsened by twisting/pinching movements as well as pushing down on her hand soap/curling iron has been occurring for 1 week. She is on the computer frequently with some typing/clicking.  She has tried aleve/ibuprofen, ice only helps briefly. She is right hand dominant.   Review of Systems See HPI  Past Medical History:  Diagnosis Date  . Gallstone   . Hypothyroidism   . Thyroid nodule      Social History   Socioeconomic History  . Marital status: Married    Spouse name: Not on file  . Number of children: Not on file  . Years of education: Not on file  . Highest education level: Not on file  Occupational History    Employer: SELF EMPLOYED    Comment: p/t with husband  Social Needs  . Financial resource strain: Not on file  . Food insecurity:    Worry: Not on file    Inability: Not on file  . Transportation needs:    Medical: Not on file    Non-medical: Not on file  Tobacco Use  . Smoking status: Never Smoker  . Smokeless tobacco: Never Used  Substance and Sexual Activity  . Alcohol use: Yes    Alcohol/week: 14.0 standard drinks    Types: 14 Glasses of wine per week  . Drug use: No  . Sexual activity: Yes    Partners: Male  Lifestyle  . Physical activity:    Days per week: Not on file    Minutes per session: Not on file  . Stress: Not on file  Relationships  . Social connections:    Talks on phone: Not on file    Gets together: Not on file    Attends religious service: Not on file    Active member of club or organization: Not on file    Attends meetings of clubs or organizations: Not on file    Relationship status: Not on file  . Intimate partner violence:    Fear of current or ex partner: Not on file    Emotionally abused: Not on file   Physically abused: Not on file    Forced sexual activity: Not on file  Other Topics Concern  . Not on file  Social History Narrative   Regular exercise- yes     Past Surgical History:  Procedure Laterality Date  . BREAST ENHANCEMENT SURGERY  2001  . CESAREAN SECTION     x 2  . CHOLECYSTECTOMY  05/1999  . DILATION AND CURETTAGE OF UTERUS  2011  . LASIK    . uterine ablation  2011  . VEIN LIGATION AND STRIPPING    . VIDEO BRONCHOSCOPY Bilateral 12/10/2015   Procedure: VIDEO BRONCHOSCOPY WITHOUT FLUORO;  Surgeon: Tanda Rockers, MD;  Location: [facility];  Service: Endoscopy;  Laterality: Bilateral;    Family History  Problem Relation Age of Onset  . Hypertension Mother   . Arthritis Mother   . Hypertension Father   . Arthritis Father   . Neuropathy Unknown   . Lymphoma Paternal Uncle   . Cancer Paternal Grandfather   . Stroke Paternal Grandmother     Allergies  Allergen Reactions  . Amoxil [Amoxicillin]   . Imuran [Azathioprine] Rash  fever  . Penicillins Rash    Current Outpatient Medications on File Prior to Visit  Medication Sig Dispense Refill  . Albuterol Sulfate (PROAIR RESPICLICK) 549 (90 Base) MCG/ACT AEPB Inhale 2 puffs into the lungs every 4 (four) hours as needed. 1 each 2  . alendronate (FOSAMAX) 70 MG tablet Take 70 mg by mouth once a week. Take with a full glass of water on an empty stomach.    . budesonide-formoterol (SYMBICORT) 160-4.5 MCG/ACT inhaler INHALE TWO PUFFS TWICE DAILY TO PREVENT COUGH OR WHEEZE. RINSE MOUTH AFTER USE. 30.6 Inhaler 0  . Collagenase POWD 1 Scoop by Does not apply route daily.    . Fluticasone Propionate (XHANCE) 93 MCG/ACT EXHU Place 2 sprays into the nose 2 (two) times daily. 32 mL 12  . levothyroxine (SYNTHROID, LEVOTHROID) 50 MCG tablet TAKE 1 TABLET (50 MCG TOTAL) BY MOUTH DAILY BEFORE BREAKFAST. 90 tablet 2  . pravastatin (PRAVACHOL) 40 MG tablet Take 1 tablet (40 mg total) by mouth daily. 90 tablet 0  . predniSONE  (DELTASONE) 1 MG tablet Take 0.5 mg by mouth daily with breakfast.    . VITAMIN D, ERGOCALCIFEROL, PO Take 1 tablet by mouth daily.     Current Facility-Administered Medications on File Prior to Visit  Medication Dose Route Frequency Provider Last Rate Last Dose  . Mepolizumab SOLR 300 mg  300 mg Subcutaneous Q28 days Bobbitt, Sedalia Muta, MD   300 mg at 05/11/18 0906    BP 118/68 (BP Location: Right Arm, Patient Position: Sitting, Cuff Size: Small)   Pulse 71   Temp 98.4 F (36.9 C) (Oral)   Resp 16   Ht 5\' 3"  (1.6 m)   Wt 137 lb (62.1 kg)   LMP 08/23/2016   SpO2 99%   BMI 24.27 kg/m       Objective:   Physical Exam  Constitutional: She appears well-developed and well-nourished.  Cardiovascular: Normal rate, regular rhythm and normal heart sounds.  No murmur heard. Pulmonary/Chest: Effort normal and breath sounds normal. No respiratory distress. She has no wheezes.  Musculoskeletal:  + pain with movement of right thumb, no swelling.   Psychiatric: She has a normal mood and affect. Her behavior is normal. Judgment and thought content normal.          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  Rx with meloxicam and refer to sports medicine for further evaluation.

## 2018-06-07 DIAGNOSIS — M301 Polyarteritis with lung involvement [Churg-Strauss]: Secondary | ICD-10-CM | POA: Diagnosis not present

## 2018-06-08 ENCOUNTER — Ambulatory Visit (INDEPENDENT_AMBULATORY_CARE_PROVIDER_SITE_OTHER): Payer: BLUE CROSS/BLUE SHIELD

## 2018-06-08 DIAGNOSIS — M301 Polyarteritis with lung involvement [Churg-Strauss]: Secondary | ICD-10-CM | POA: Diagnosis not present

## 2018-06-08 DIAGNOSIS — D7218 Eosinophilia in diseases classified elsewhere: Secondary | ICD-10-CM

## 2018-06-09 ENCOUNTER — Encounter: Payer: Self-pay | Admitting: Family Medicine

## 2018-06-09 ENCOUNTER — Ambulatory Visit (INDEPENDENT_AMBULATORY_CARE_PROVIDER_SITE_OTHER): Payer: BLUE CROSS/BLUE SHIELD | Admitting: Family Medicine

## 2018-06-09 VITALS — BP 118/70 | HR 74 | Ht 65.0 in | Wt 136.0 lb

## 2018-06-09 DIAGNOSIS — M1811 Unilateral primary osteoarthritis of first carpometacarpal joint, right hand: Secondary | ICD-10-CM

## 2018-06-09 NOTE — Patient Instructions (Signed)
Your pain is due to arthritis at the base of your thumb [facility] joint) These are the different medications you can take for this: Tylenol 500mg  1-2 tabs three times a day for pain. Capsaicin, aspercreme, or biofreeze topically up to four times a day may also help with pain. Some supplements that may help for arthritis: Boswellia extract, curcumin, pycnogenol Meloxicam 7.5mg  daily with food - take 7-10 days then as needed. Cortisone injections are an option. Thumb spica brace as often as possible to rest this. Ice 15 minutes at a time 3-4 times a day as needed to help with pain. Follow up with me in 6 weeks (or as needed if you're doing well).

## 2018-06-10 ENCOUNTER — Other Ambulatory Visit: Payer: Self-pay | Admitting: Family Medicine

## 2018-06-12 ENCOUNTER — Encounter: Payer: Self-pay | Admitting: Family Medicine

## 2018-06-12 NOTE — Progress Notes (Signed)
PCP and consultation requested by: Copland, Gay Filler, MD  Subjective:   HPI: Patient is a 57 y.o. female here for right thumb pain.  Patient reports for about 1 week she's had worse pain base of right thumb. She is right handed. Pain level 3/10, can be sharp, worse with fastening buttons, putting on jewelry, pushing on hand soap. Has been icing, taking ibuprofen, aleve. No skin changes, numbness.  Past Medical History:  Diagnosis Date  . Gallstone   . Hypothyroidism   . Thyroid nodule     Current Outpatient Medications on File Prior to Visit  Medication Sig Dispense Refill  . Albuterol Sulfate (PROAIR RESPICLICK) 644 (90 Base) MCG/ACT AEPB Inhale 2 puffs into the lungs every 4 (four) hours as needed. 1 each 2  . alendronate (FOSAMAX) 70 MG tablet Take 70 mg by mouth once a week. Take with a full glass of water on an empty stomach.    . budesonide-formoterol (SYMBICORT) 160-4.5 MCG/ACT inhaler INHALE TWO PUFFS TWICE DAILY TO PREVENT COUGH OR WHEEZE. RINSE MOUTH AFTER USE. 30.6 Inhaler 0  . Collagenase POWD 1 Scoop by Does not apply route daily.    . Fluticasone Propionate (XHANCE) 93 MCG/ACT EXHU Place 2 sprays into the nose 2 (two) times daily. 32 mL 12  . levothyroxine (SYNTHROID, LEVOTHROID) 50 MCG tablet TAKE 1 TABLET (50 MCG TOTAL) BY MOUTH DAILY BEFORE BREAKFAST. 90 tablet 2  . meloxicam (MOBIC) 7.5 MG tablet Take 1 tablet (7.5 mg total) by mouth daily. 14 tablet 0  . pravastatin (PRAVACHOL) 40 MG tablet Take 1 tablet (40 mg total) by mouth daily. 90 tablet 0  . predniSONE (DELTASONE) 1 MG tablet Take 0.5 mg by mouth daily with breakfast.    . VITAMIN D, ERGOCALCIFEROL, PO Take 1 tablet by mouth daily.     Current Facility-Administered Medications on File Prior to Visit  Medication Dose Route Frequency Provider Last Rate Last Dose  . Mepolizumab SOLR 300 mg  300 mg Subcutaneous Q28 days Bobbitt, Sedalia Muta, MD   300 mg at 06/08/18 0347    Past Surgical History:   Procedure Laterality Date  . BREAST ENHANCEMENT SURGERY  2001  . CESAREAN SECTION     x 2  . CHOLECYSTECTOMY  05/1999  . DILATION AND CURETTAGE OF UTERUS  2011  . LASIK    . uterine ablation  2011  . VEIN LIGATION AND STRIPPING    . VIDEO BRONCHOSCOPY Bilateral 12/10/2015   Procedure: VIDEO BRONCHOSCOPY WITHOUT FLUORO;  Surgeon: Tanda Rockers, MD;  Location: [facility];  Service: Endoscopy;  Laterality: Bilateral;    Allergies  Allergen Reactions  . Amoxil [Amoxicillin]   . Imuran [Azathioprine] Rash    fever  . Penicillins Rash    Social History   Socioeconomic History  . Marital status: Married    Spouse name: Not on file  . Number of children: Not on file  . Years of education: Not on file  . Highest education level: Not on file  Occupational History    Employer: SELF EMPLOYED    Comment: p/t with husband  Social Needs  . Financial resource strain: Not on file  . Food insecurity:    Worry: Not on file    Inability: Not on file  . Transportation needs:    Medical: Not on file    Non-medical: Not on file  Tobacco Use  . Smoking status: Never Smoker  . Smokeless tobacco: Never Used  Substance and Sexual Activity  .  Alcohol use: Yes    Alcohol/week: 14.0 standard drinks    Types: 14 Glasses of wine per week  . Drug use: No  . Sexual activity: Yes    Partners: Male  Lifestyle  . Physical activity:    Days per week: Not on file    Minutes per session: Not on file  . Stress: Not on file  Relationships  . Social connections:    Talks on phone: Not on file    Gets together: Not on file    Attends religious service: Not on file    Active member of club or organization: Not on file    Attends meetings of clubs or organizations: Not on file    Relationship status: Not on file  . Intimate partner violence:    Fear of current or ex partner: Not on file    Emotionally abused: Not on file    Physically abused: Not on file    Forced sexual activity: Not on  file  Other Topics Concern  . Not on file  Social History Narrative   Regular exercise- yes     Family History  Problem Relation Age of Onset  . Hypertension Mother   . Arthritis Mother   . Hypertension Father   . Arthritis Father   . Neuropathy Unknown   . Lymphoma Paternal Uncle   . Cancer Paternal Grandfather   . Stroke Paternal Grandmother     BP 118/70   Pulse 74   Ht 5\' 5"  (1.651 m)   Wt 136 lb (61.7 kg)   LMP 08/23/2016   BMI 22.63 kg/m   Review of Systems: See HPI above.     Objective:  Physical Exam:  Gen: NAD, comfortable in exam room  Right hand/wrist: No deformity, swelling, bruising. FROM with 5/5 strength but pain with all thumb motions. TTP 1st CMC joint.  No TTP 1st dorsal compartment, carpal tunnel.   Negative finkelsteins, tinels. NVI distally.  Left hand/wrist: No deformity. FROM with 5/5 strength. No tenderness to palpation. NVI distally.   Assessment & Plan:  1. 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  She has not yet started meloxicam - encouraged to do this.  Discussed tylenol, topical medications, supplements that may help.  Consider injection - declined today.  Thumb spica brace.  Icing.  F/u in 6 weeks.

## 2018-06-28 DIAGNOSIS — R0981 Nasal congestion: Secondary | ICD-10-CM | POA: Diagnosis not present

## 2018-06-28 DIAGNOSIS — R0602 Shortness of breath: Secondary | ICD-10-CM | POA: Diagnosis not present

## 2018-06-28 DIAGNOSIS — M150 Primary generalized (osteo)arthritis: Secondary | ICD-10-CM | POA: Diagnosis not present

## 2018-06-28 DIAGNOSIS — M301 Polyarteritis with lung involvement [Churg-Strauss]: Secondary | ICD-10-CM | POA: Diagnosis not present

## 2018-07-05 DIAGNOSIS — M301 Polyarteritis with lung involvement [Churg-Strauss]: Secondary | ICD-10-CM | POA: Diagnosis not present

## 2018-07-06 ENCOUNTER — Ambulatory Visit (INDEPENDENT_AMBULATORY_CARE_PROVIDER_SITE_OTHER): Payer: BLUE CROSS/BLUE SHIELD

## 2018-07-06 DIAGNOSIS — M301 Polyarteritis with lung involvement [Churg-Strauss]: Secondary | ICD-10-CM

## 2018-07-17 DIAGNOSIS — D2371 Other benign neoplasm of skin of right lower limb, including hip: Secondary | ICD-10-CM | POA: Diagnosis not present

## 2018-07-17 DIAGNOSIS — D485 Neoplasm of uncertain behavior of skin: Secondary | ICD-10-CM | POA: Diagnosis not present

## 2018-07-17 DIAGNOSIS — D2271 Melanocytic nevi of right lower limb, including hip: Secondary | ICD-10-CM | POA: Diagnosis not present

## 2018-07-17 DIAGNOSIS — D2239 Melanocytic nevi of other parts of face: Secondary | ICD-10-CM | POA: Diagnosis not present

## 2018-07-17 DIAGNOSIS — I781 Nevus, non-neoplastic: Secondary | ICD-10-CM | POA: Diagnosis not present

## 2018-07-17 DIAGNOSIS — D2339 Other benign neoplasm of skin of other parts of face: Secondary | ICD-10-CM | POA: Diagnosis not present

## 2018-07-21 ENCOUNTER — Ambulatory Visit: Payer: BLUE CROSS/BLUE SHIELD | Admitting: Family Medicine

## 2018-07-21 DIAGNOSIS — Z780 Asymptomatic menopausal state: Secondary | ICD-10-CM | POA: Diagnosis not present

## 2018-07-21 DIAGNOSIS — M818 Other osteoporosis without current pathological fracture: Secondary | ICD-10-CM | POA: Diagnosis not present

## 2018-07-21 DIAGNOSIS — M8588 Other specified disorders of bone density and structure, other site: Secondary | ICD-10-CM | POA: Diagnosis not present

## 2018-08-02 DIAGNOSIS — M301 Polyarteritis with lung involvement [Churg-Strauss]: Secondary | ICD-10-CM | POA: Diagnosis not present

## 2018-08-03 ENCOUNTER — Ambulatory Visit: Payer: BLUE CROSS/BLUE SHIELD

## 2018-08-03 ENCOUNTER — Ambulatory Visit (INDEPENDENT_AMBULATORY_CARE_PROVIDER_SITE_OTHER): Payer: BLUE CROSS/BLUE SHIELD

## 2018-08-03 DIAGNOSIS — D7218 Eosinophilia in diseases classified elsewhere: Secondary | ICD-10-CM

## 2018-08-03 DIAGNOSIS — M301 Polyarteritis with lung involvement [Churg-Strauss]: Secondary | ICD-10-CM | POA: Diagnosis not present

## 2018-08-09 ENCOUNTER — Ambulatory Visit: Payer: BLUE CROSS/BLUE SHIELD | Admitting: Allergy and Immunology

## 2018-08-14 ENCOUNTER — Telehealth: Payer: Self-pay

## 2018-08-14 NOTE — Telephone Encounter (Signed)
Hello Dr. Verlin Fester - I wanted to touch base on a couple of things that I have been noticing. I have completely come off Prednisone since early last month (November).  My current regiment is: 2 puffs of Symbicort 160/4.5 twice a day (morning and evening), 1 squirt in each nostril twice a day (morning and night) of Xhance 87mcg and 3 injections of Nucala monthly - my last injections were December 5th.   I have noticed the following: since taking Xhance - I have blood clot like things in my nostrils - not nose bleeds - I have really no need to blow my nose - but I can tell there is stuff up in my nostrils. I take a kleenex and clean out my nose - pulling the blood clots out. When I do blow my nose - there is blood. Smell is very diminished. Finally - I have noticed a tingling/tickling sensation in my throat that is causing a "dry cough" - not in my chest but my throat.   I had to reschedule my Dec appt with you but am able to come to either office if needed. Thank you!! Tymber     Please advise as Dr Verlin Fester is out of clinic

## 2018-08-15 NOTE — Telephone Encounter (Signed)
Thank you :)

## 2018-08-17 ENCOUNTER — Telehealth: Payer: Self-pay | Admitting: Family Medicine

## 2018-08-17 MED ORDER — SCOPOLAMINE 1 MG/3DAYS TD PT72: 1.0000 | MEDICATED_PATCH | TRANSDERMAL | 0 refills | Status: DC

## 2018-08-17 NOTE — Telephone Encounter (Signed)
Call patient.  She is going on a cruise, will be gone for 5 days.  She has not you scopolamine patches in the past, explained how to use them and cautioned to wash hands after touching patch.  She has no history of glaucoma.

## 2018-08-17 NOTE — Telephone Encounter (Signed)
Copied from [place] [id number]. Topic: Quick Communication - Rx Refill/Question >> Aug 17, 2018  2:46 PM Margot Ables wrote: Medication: pt is going on a cruise in January and is wanting to know if she can be prescribed patches for sea sickness.   Preferred Pharmacy (with phone number or street name): [id number] - [place], [place] - [address] [phone] (Phone) [phone] (Fax)

## 2018-08-29 DIAGNOSIS — M301 Polyarteritis with lung involvement [Churg-Strauss]: Secondary | ICD-10-CM | POA: Diagnosis not present

## 2018-08-31 ENCOUNTER — Ambulatory Visit (INDEPENDENT_AMBULATORY_CARE_PROVIDER_SITE_OTHER): Payer: BLUE CROSS/BLUE SHIELD

## 2018-08-31 DIAGNOSIS — M301 Polyarteritis with lung involvement [Churg-Strauss]: Secondary | ICD-10-CM

## 2018-08-31 DIAGNOSIS — D7218 Eosinophilia in diseases classified elsewhere: Secondary | ICD-10-CM

## 2018-09-10 ENCOUNTER — Other Ambulatory Visit: Payer: Self-pay | Admitting: Allergy and Immunology

## 2018-09-11 ENCOUNTER — Other Ambulatory Visit: Payer: Self-pay | Admitting: Allergy

## 2018-09-11 MED ORDER — BUDESONIDE-FORMOTEROL FUMARATE 160-4.5 MCG/ACT IN AERO: INHALATION_SPRAY | RESPIRATORY_TRACT | 0 refills | Status: DC

## 2018-09-20 DIAGNOSIS — N941 Unspecified dyspareunia: Secondary | ICD-10-CM | POA: Diagnosis not present

## 2018-09-20 DIAGNOSIS — N942 Vaginismus: Secondary | ICD-10-CM | POA: Diagnosis not present

## 2018-09-29 DIAGNOSIS — M301 Polyarteritis with lung involvement [Churg-Strauss]: Secondary | ICD-10-CM | POA: Diagnosis not present

## 2018-10-02 ENCOUNTER — Ambulatory Visit (INDEPENDENT_AMBULATORY_CARE_PROVIDER_SITE_OTHER): Payer: BLUE CROSS/BLUE SHIELD

## 2018-10-02 ENCOUNTER — Ambulatory Visit: Payer: Self-pay

## 2018-10-02 DIAGNOSIS — M301 Polyarteritis with lung involvement [Churg-Strauss]: Secondary | ICD-10-CM

## 2018-10-02 DIAGNOSIS — J4550 Severe persistent asthma, uncomplicated: Secondary | ICD-10-CM

## 2018-10-03 ENCOUNTER — Ambulatory Visit: Payer: BLUE CROSS/BLUE SHIELD | Attending: Obstetrics and Gynecology | Admitting: Physical Therapy

## 2018-10-03 ENCOUNTER — Other Ambulatory Visit: Payer: Self-pay

## 2018-10-03 ENCOUNTER — Encounter: Payer: Self-pay | Admitting: Physical Therapy

## 2018-10-03 DIAGNOSIS — M6281 Muscle weakness (generalized): Secondary | ICD-10-CM | POA: Diagnosis present

## 2018-10-03 DIAGNOSIS — R252 Cramp and spasm: Secondary | ICD-10-CM | POA: Insufficient documentation

## 2018-10-03 DIAGNOSIS — R279 Unspecified lack of coordination: Secondary | ICD-10-CM | POA: Insufficient documentation

## 2018-10-03 NOTE — Patient Instructions (Signed)
Moisturizers . They are used in the vagina to hydrate the mucous membrane that make up the vaginal canal. . Designed to keep a more normal acid balance (ph) . Once placed in the vagina, it will last between two to three days.  . Use 2-3 times per week at bedtime and last longer than 60 min. . Ingredients to avoid is glycerin and fragrance, can increase chance of infection . Should not be used just before sex due to causing irritation . Most are gels administered either in a tampon-shaped applicator or as a vaginal suppository. They are non-hormonal.   Types of Moisturizers . Samul Dada- drug store . Vitamin E vaginal suppositories- Whole foods, Amazon . Moist Again . Coconut oil- can break down condoms . Julva- (Do no use if on Tamoxifen) amazon . Yes moisturizer- amazon . NeuEve Silk , NeuEve Silver for menopausal or over 65 (if have severe vaginal atrophy or cancer treatments use NeuEve Silk for  1 month than move to [employer])- [employer], MapleFlower.dk . Olive and Bee intimate cream- www.oliveandbee.com.au . Mae vaginal moisturizer- Amazon  Creams to use externally on the Vulva area  Albertson's (good for for cancer patients that had radiation to the area)- [place] or [employer].FlyingBasics.com.br  V-magic cream - amazon  Julva-amazon  Vital "V Wild Yam salve ( help moisturize and help with thinning vulvar area, does have [place] by Damiva labial moisturizer ([place],    Things to avoid in the vaginal area . Do not use things to irritate the vulvar area . No lotions just specialized creams for the vulva area- Neogyn, V-magic, No soaps; can use Aveeno or Calendula cleanser if needed. Must be gentle . No deodorants . No douches . Good to sleep without underwear to let the vaginal area to air out . No scrubbing: spread the lips to let warm water rinse over labias and pat dry  STRETCHING THE PELVIC  FLOOR MUSCLES NO DILATOR  Supplies . Vaginal lubricant . Mirror (optional) . Gloves (optional) Positioning . Start in a semi-reclined position with your head propped up. Bend your knees and place your thumb or finger at the vaginal opening. Procedure . Apply a moderate amount of lubricant on the outer skin of your vagina, the labia minora.  Apply additional lubricant to your finger. Marland Kitchen Spread the skin away from the vaginal opening. Place the end of your finger at the opening. . Do a maximum contraction of the pelvic floor muscles. Tighten the vagina and the anus maximally and relax. . When you know they are relaxed, gently and slowly insert your finger into your vagina, directing your finger slightly downward, for 2-3 inches of insertion. . Relax and stretch the 6 o'clock position . Hold each stretch for _2 min__ and repeat __1_ time with rest breaks of _1__ seconds between each stretch. . Repeat the stretching in the 4 o'clock and 8 o'clock positions. . Total time should be _6__ minutes, _1__ x per day.  Note the amount of theme your were able to achieve and your tolerance to your finger in your vagina. . Once you have accomplished the techniques you may try them in standing with one foot resting on the tub, or in other positions.  This is a good stretch to do in the shower if you don't need to use lubricant.   [facility] [address], [place], [place] [address] Phone # [phone]  Fax [phone]

## 2018-10-03 NOTE — Therapy (Signed)
[facility] [address] [place], [place], [address] Phone: [phone]   Fax:  [phone]  Physical Therapy Evaluation  Patient Details  Name: Judy Ramos MRN: [id number] Date of Birth: 1961-07-13 Referring Provider (PT): Dian Queen, MD   Encounter Date: 10/03/2018  PT End of Session - 10/03/18 1440    Visit Number  1    Date for PT Re-Evaluation  11/28/18    PT Start Time  1440    PT Stop Time  1520    PT Time Calculation (min)  40 min    Activity Tolerance  Patient tolerated treatment well    Behavior During Therapy  [facility] for tasks assessed/performed       Past Medical History:  Diagnosis Date  . Gallstone   . Hypothyroidism   . Thyroid nodule     Past Surgical History:  Procedure Laterality Date  . BREAST ENHANCEMENT SURGERY  2001  . CESAREAN SECTION     x 2  . CHOLECYSTECTOMY  05/1999  . DILATION AND CURETTAGE OF UTERUS  2011  . LASIK    . uterine ablation  2011  . VEIN LIGATION AND STRIPPING    . VIDEO BRONCHOSCOPY Bilateral 12/10/2015   Procedure: VIDEO BRONCHOSCOPY WITHOUT FLUORO;  Surgeon: Tanda Rockers, MD;  Location: [facility];  Service: Endoscopy;  Laterality: Bilateral;    There were no vitals filed for this visit.   Subjective Assessment - 10/03/18 1444    Subjective  In menopause and started noticing burning after and during intercourse.  I did the [place] treatment and it didn't get much better.  Penetration is an incredible amount of burning and we don't even try anymore.  Trying intrarosa and just started that.    Patient Stated Goals  be able to have intercourse    Currently in Pain?  No/denies         [facility] PT Assessment - 10/03/18 0001      Assessment   Medical Diagnosis  N94.10 (ICD-10-CM) - Unspecified dyspareunia    Referring Provider (PT)  Dian Queen, MD    Onset Date/Surgical Date  --   1 year   Prior Therapy  No      Precautions   Precautions   None      Restrictions   Weight Bearing Restrictions  No      Balance Screen   Has the patient fallen in the past 6 months  No      [place] residence    Living Arrangements  Spouse/significant other      Prior Function   Level of Independence  Independent    Vocation  Part time employment    Engineer, mining, sitting and sometimes at [employer]   Overall Cognitive Status  Within Functional Limits for tasks assessed      Posture/Postural Control   Posture/Postural Control  Postural limitations    Postural Limitations  Rounded Shoulders;Increased thoracic kyphosis      ROM / Strength   AROM / PROM / Strength  Strength;PROM      PROM   Overall PROM Comments  hip IR and ER 75%      Strength   Overall Strength Comments  5/5 bil LE      Flexibility   Soft Tissue Assessment /Muscle Length  yes    Hamstrings  75%      Palpation  Palpation comment  lower abdominal incision restrictions up and to the left      Ambulation/Gait   Gait Pattern  Within Functional Limits                Objective measurements completed on examination: See above findings.    Pelvic Floor Special Questions - 10/03/18 0001    Prior Pelvic/Prostate Exam  Yes    Are you Pregnant or attempting pregnancy?  No    Prior Pregnancies  Yes    Number of Pregnancies  2    Number of C-Sections  2    Currently Sexually Active  Yes    Is this Painful  Yes    Marinoff Scale  pain prevents any attempts at intercourse    Urinary Leakage  No    Urinary urgency  No    Urinary frequency  No    Fecal incontinence  No   occasional   Fluid intake  64 oz    Skin Integrity  --   redness of labia, dry   Perineal Body/Introitus   Elevated    Prolapse  None    Pelvic Floor Internal Exam  pt identity confirmed and informed consent given to perform internal soft tissue assessment and treatment    Palpation  has a hard time relaxing after  contracting, high tone bulbospongeosis and levator ani muslces, responded well to stretching    Strength  good squeeze, good lift, able to hold agaisnt strong resistance    Strength # of seconds  3       [facility] Adult PT Treatment/Exercise - 10/03/18 0001      Self-Care   Self-Care  Other Self-Care Comments    Other Self-Care Comments   reviewed self massage and moisturizers, initial HEP as seen in chart, educated and performed             PT Education - 10/03/18 1549    Education Details   Access Code: [email or web address], stretch and moistruize pelvic floor    Person(s) Educated  Patient    Methods  Explanation;Demonstration;Verbal cues;Handout    Comprehension  Verbalized understanding;Returned demonstration          PT Long Term Goals - 10/03/18 1551      PT LONG TERM GOAL #1   Title  ind with advanced HEP    Time  8    Period  Weeks    Status  New    Target Date  11/28/18      PT LONG TERM GOAL #2   Title  pt will be able to relax < or = to 1 second after performing kegel    Time  8    Period  Weeks    Status  New    Target Date  11/28/18      PT LONG TERM GOAL #3   Title  pt will be ind with moisturizing and self massage for improved soft tissue health of pelvic floor    Time  8    Period  Weeks    Status  New    Target Date  11/28/18      PT LONG TERM GOAL #4   Title  reports 1/3 marinoff scale    Time  8    Period  Weeks    Status  New    Target Date  11/28/18             Plan - 10/03/18 1540    Clinical Impression Statement  Patient presents to clinic due to dyspareunia that has been ongoing for 1 year.  She also has chronic low back and hip pain.  Pt has vaginal dryness and tight muscles as noted above.  She has scar tissue adhesions, tight hamstrings, mildly kyphotic posture in thoracic spine.  She has good strength but difficulty relaxing pelvic floor after contracting.  Pt has low endurance.  She could benefit from skilled PT to address posture and  pelvic floor muscle coorination and endurance as well as improved soft tissue length and pliability for improved health and quality of life.    History and Personal Factors relevant to plan of care:  2 c-sections, menopause, chronic back pain    Clinical Presentation  Evolving    Clinical Presentation due to:  pain has been worsening    Clinical Decision Making  Low    Rehab Potential  Excellent    PT Frequency  1x / week    PT Duration  8 weeks    PT Treatment/Interventions  ADLs/Self Care Home Management;Biofeedback;Cryotherapy;Moist Heat;Therapeutic activities;Therapeutic exercise;Neuromuscular re-education;Scar mobilization;Dry needling;Manual techniques    PT Next Visit Plan  f/u on self massage and breathing with stretches, biofeedback for downtraining and contract/relax and identifying the best stretches, abdominal fascial release, thoracic extension    PT Home Exercise Plan   Access Code: [email or web address]     Recommended Other Services  eval 10/03/18    Consulted and Agree with Plan of Care  Patient       Patient will benefit from skilled therapeutic intervention in order to improve the following deficits and impairments:  Pain, Increased fascial restricitons, Decreased range of motion, Decreased strength, Decreased endurance, Impaired flexibility, Increased muscle spasms, Decreased scar mobility  Visit Diagnosis: Cramp and spasm  Unspecified lack of coordination  Muscle weakness (generalized)     Problem List Patient Active Problem List   Diagnosis Date Noted  . Osteoporosis 09/02/2016  . Eosinophilia 04/26/2016  . Chronic rhinitis 04/26/2016  . Family history of colonic polyps 02/02/2016  . 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 Asthmatic bronchitis 01/05/2016  . Hyperlipidemia 01/05/2016  . Moderate persistent asthma 11/13/2015  . Atelectasis RUL 11/13/2015  . Perennial allergic rhinitis with a nonallergic component 11/10/2015  . Persistent cough 11/10/2015  . Esophageal reflux 11/10/2015  .  Chronic maxillary sinusitis 11/10/2015  . History of asthma 11/10/2015  . Acute sinusitis treated with antibiotics in the past 60 days 10/22/2013  . Rash 12/23/2010  . IRREGULAR MENSES 01/20/2010  . VARICOSE VEINS LOWER EXTREMITIES W/INFLAMMATION 10/16/2009  . CERVICAL POLYP 10/16/2009  . INSOMNIA 09/05/2008  . Hypothyroid 10/23/2007  . THYROID NODULE 02/08/2007    Zannie Cove, PT 10/03/2018, 3:55 PM  [facility] [address] [place], [place], [address] Phone: [phone]   Fax:  [phone]  Name: Judy Ramos MRN: [id number] Date of Birth: 02/15/1961

## 2018-10-11 ENCOUNTER — Ambulatory Visit: Payer: BLUE CROSS/BLUE SHIELD | Admitting: Physical Therapy

## 2018-10-17 ENCOUNTER — Encounter: Payer: Self-pay | Admitting: Physical Therapy

## 2018-10-17 ENCOUNTER — Ambulatory Visit: Payer: BLUE CROSS/BLUE SHIELD | Admitting: Physical Therapy

## 2018-10-17 DIAGNOSIS — R252 Cramp and spasm: Secondary | ICD-10-CM

## 2018-10-17 DIAGNOSIS — R279 Unspecified lack of coordination: Secondary | ICD-10-CM

## 2018-10-17 DIAGNOSIS — M6281 Muscle weakness (generalized): Secondary | ICD-10-CM

## 2018-10-17 NOTE — Therapy (Addendum)
[facility] [address] [place], [place], [address] Phone: [phone]   Fax:  [phone]  Physical Therapy Treatment  Patient Details  Name: Judy Ramos MRN: [id number] Date of Birth: Dec 22, 1960 Referring Provider (PT): Dian Queen, MD   Encounter Date: 10/17/2018  PT End of Session - 10/17/18 1400    Visit Number  2    Date for PT Re-Evaluation  11/28/18    PT Start Time  1400    PT Stop Time  1438    PT Time Calculation (min)  38 min    Activity Tolerance  Patient tolerated treatment well    Behavior During Therapy  [facility] for tasks assessed/performed       Past Medical History:  Diagnosis Date  . Gallstone   . Hypothyroidism   . Thyroid nodule     Past Surgical History:  Procedure Laterality Date  . BREAST ENHANCEMENT SURGERY  2001  . CESAREAN SECTION     x 2  . CHOLECYSTECTOMY  05/1999  . DILATION AND CURETTAGE OF UTERUS  2011  . LASIK    . uterine ablation  2011  . VEIN LIGATION AND STRIPPING    . VIDEO BRONCHOSCOPY Bilateral 12/10/2015   Procedure: VIDEO BRONCHOSCOPY WITHOUT FLUORO;  Surgeon: Tanda Rockers, MD;  Location: [facility];  Service: Endoscopy;  Laterality: Bilateral;    There were no vitals filed for this visit.  Subjective Assessment - 10/17/18 1405    Subjective  Pt reports she was able to have intercourse and wasn't painful.  Pt is very happy with her progress and feels able to continue on her own.    Patient Stated Goals  be able to have intercourse    Currently in Pain?  No/denies                       [facility] Adult PT Treatment/Exercise - 10/17/18 0001      Self-Care   Other Self-Care Comments   reviewed and discussed how to use vitamin E and coconut oil for moisture and massage      Neuro Re-ed    Neuro Re-ed Details   circles on ball, breathing with stretches      Exercises   Exercises  Lumbar;Other Exercises    Other Exercises   reviewed all  exercises      Lumbar Exercises: Stretches   Active Hamstring Stretch  Right;Left   sitting on ball   Other Lumbar Stretch Exercise  child's pose, cobra, butterfly, side bend with child's pose             PT Education - 10/17/18 1443    Education Details   Access Code: [email or web address]     Person(s) Educated  Patient    Methods  Explanation;Demonstration;Verbal cues;Handout    Comprehension  Verbalized understanding;Returned demonstration          PT Long Term Goals - 10/17/18 1439      PT LONG TERM GOAL #1   Title  ind with advanced HEP    Status  Achieved      PT LONG TERM GOAL #2   Title  pt will be able to relax < or = to 1 second after performing kegel    Baseline  did not recheck due to patient happy with her progress on personal goals and is doing well with everything else    Status  Deferred      PT LONG TERM GOAL #  3   Title  pt will be ind with moisturizing and self massage for improved soft tissue health of pelvic floor    Status  Achieved      PT LONG TERM GOAL #4   Title  reports 1/3 marinoff scale    Baseline  no pain - 0/3    Status  Achieved            Plan - 10/17/18 1440    Clinical Impression Statement  Pt was able to perform stretches and exercises correctly and feel stretches appropriately.  Pt will discharge with HEP today.  She has been doing well with HEP and self care activities.      PT Treatment/Interventions  ADLs/Self Care Home Management;Biofeedback;Cryotherapy;Moist Heat;Therapeutic activities;Therapeutic exercise;Neuromuscular re-education;Scar mobilization;Dry needling;Manual techniques    PT Next Visit Plan  discharged    PT Home Exercise Plan   Access Code: [email or web address]     Consulted and Agree with Plan of Care  Patient       Patient will benefit from skilled therapeutic intervention in order to improve the following deficits and impairments:  Increased fascial restricitons, Decreased range of motion, Decreased strength, Decreased  endurance, Impaired flexibility, Increased muscle spasms, Decreased scar mobility  Visit Diagnosis: Cramp and spasm  Unspecified lack of coordination  Muscle weakness (generalized)     Problem List Patient Active Problem List   Diagnosis Date Noted  . Osteoporosis 09/02/2016  . Eosinophilia 04/26/2016  . Chronic rhinitis 04/26/2016  . Family history of colonic polyps 02/02/2016  . 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 Asthmatic bronchitis 01/05/2016  . Hyperlipidemia 01/05/2016  . Moderate persistent asthma 11/13/2015  . Atelectasis RUL 11/13/2015  . Perennial allergic rhinitis with a nonallergic component 11/10/2015  . Persistent cough 11/10/2015  . Esophageal reflux 11/10/2015  . Chronic maxillary sinusitis 11/10/2015  . History of asthma 11/10/2015  . Acute sinusitis treated with antibiotics in the past 60 days 10/22/2013  . Rash 12/23/2010  . IRREGULAR MENSES 01/20/2010  . VARICOSE VEINS LOWER EXTREMITIES W/INFLAMMATION 10/16/2009  . CERVICAL POLYP 10/16/2009  . INSOMNIA 09/05/2008  . Hypothyroid 10/23/2007  . THYROID NODULE 02/08/2007    Zannie Cove, PT 10/17/2018, 2:46 PM  [facility] [address], [place], [place], [address] Phone: [phone]   Fax:  [phone]  Name: Judy Ramos MRN: [id number] Date of Birth: April 23, 1961  PHYSICAL THERAPY DISCHARGE SUMMARY  Visits from Start of Care: 2  Current functional level related to goals / functional outcomes: See above details   Remaining deficits: See above   Education / Equipment: HEP  Plan: Patient agrees to discharge.  Patient goals were not met. Patient is being discharged due to being pleased with the current functional level.  ?????     [employer], PT 11/16/18 8:53 AM

## 2018-10-17 NOTE — Patient Instructions (Signed)
Access Code: [email or web address]  URL: [email or web address]  Date: 10/17/2018  Prepared by: Lovett Calender   Exercises  Hooklying Rib Cage Breathing - 5 reps - 1 sets - 3x daily - 7x weekly  Supine Piriformis Stretch - 10 reps - 3 sets - 1x daily - 7x weekly  Supine Double Knee to Chest - 3 reps - 1 sets - 30 hold - 1x daily - 7x weekly  Supine Pelvic Floor Stretch - 3 reps - 1 sets - 30 sec hold - 1x daily - 7x weekly  Sidelying Thoracic Rotation with Open Book - 3 reps - 1 sets - 30 hold - 1x daily - 7x weekly  Supine Hamstring Stretch with Strap - 3 reps - 1 sets - 30 sec hold - 1x daily - 7x weekly  Seated Hamstring Stretch on Swiss Ball - 3 reps - 1 sets - 30 sec hold - 1x daily - 7x weekly  Pelvic Circles on Swiss Ball - 10 reps - 3 sets - 1x daily - 7x weekly  Cat-Camel - 10 reps - 1 sets - 5 sec hold - 1x daily - 7x weekly  Child's Pose with Sidebending - 3 reps - 1 sets - 30 sec hold - 1x daily - 7x weekly  Baby Cobra Hands Down - 10 reps - 1 sets - 5 sec hold - 1x daily - 7x weekly  Supine Butterfly Groin Stretch - 3 reps - 1 sets - 30 sec hold - 1x daily - 7x weekly  Supine Hip Internal and External Rotation - 3 reps - 1 sets - 30 sec hold - 1x daily - 7x weekly

## 2018-10-25 DIAGNOSIS — M301 Polyarteritis with lung involvement [Churg-Strauss]: Secondary | ICD-10-CM | POA: Diagnosis not present

## 2018-10-26 ENCOUNTER — Ambulatory Visit (INDEPENDENT_AMBULATORY_CARE_PROVIDER_SITE_OTHER): Payer: BLUE CROSS/BLUE SHIELD | Admitting: Allergy and Immunology

## 2018-10-26 ENCOUNTER — Encounter: Payer: Self-pay | Admitting: Allergy and Immunology

## 2018-10-26 VITALS — BP 118/78 | HR 78 | Temp 98.1°F | Resp 16

## 2018-10-26 DIAGNOSIS — R430 Anosmia: Secondary | ICD-10-CM | POA: Diagnosis not present

## 2018-10-26 DIAGNOSIS — M301 Polyarteritis with lung involvement [Churg-Strauss]: Secondary | ICD-10-CM

## 2018-10-26 DIAGNOSIS — J4540 Moderate persistent asthma, uncomplicated: Secondary | ICD-10-CM

## 2018-10-26 DIAGNOSIS — J310 Chronic rhinitis: Secondary | ICD-10-CM

## 2018-10-26 DIAGNOSIS — D7218 Eosinophilia in diseases classified elsewhere: Secondary | ICD-10-CM

## 2018-10-26 MED ORDER — BUDESONIDE-FORMOTEROL FUMARATE 160-4.5 MCG/ACT IN AERO: INHALATION_SPRAY | RESPIRATORY_TRACT | 0 refills | Status: DC

## 2018-10-26 NOTE — Assessment & Plan Note (Signed)
   Continue mepolizomab injections and treatment plan as outlined above for asthma and chronic rhinitis. 

## 2018-10-26 NOTE — Assessment & Plan Note (Addendum)
The patient's history suggests nasal polyps.  Xhance (as above).  If this problem persists or progresses, ENT evaluation may be warranted.

## 2018-10-26 NOTE — Patient Instructions (Addendum)
Moderate persistent asthma Well-controlled.  Continue Symbicort 160-4.5 g, 2 inhalations via spacer device twice daily, and albuterol HFA, 1 to 2 inhalations every 4-6 hours if needed.  Continue mepolizomab injections as prescribed.  If subjective and objective measures of pulmonary function remain stable, we will consider stepping down therapy on the next visit.  Chronic rhinitis  Continue Xhance, 1-2 actuations per nostril twice daily.  To keep the nasal mucosa moist, use nasal saline gel or spray.  Anosmia The patient's history suggests nasal polyps.  Xhance (as above).  If this problem persists or progresses, ENT evaluation may be warranted.  EGPA  Continue mepolizomab injections and treatment plan as outlined above for asthma and chronic rhinitis.   Return in about 5-6 months or sooner if needed.

## 2018-10-26 NOTE — Assessment & Plan Note (Addendum)
Well-controlled.  Continue Symbicort 160-4.5 g, 2 inhalations via spacer device twice daily, and albuterol HFA, 1 to 2 inhalations every 4-6 hours if needed.  Continue mepolizomab injections as prescribed.  If subjective and objective measures of pulmonary function remain stable, we will consider stepping down therapy on the next visit.

## 2018-10-26 NOTE — Progress Notes (Signed)
Follow-up Note  RE: Judy Ramos MRN: [id number] DOB: 10/03/1960 Date of Office Visit: 10/26/2018  Primary care provider: Darreld Mclean, MD Referring provider: Darreld Mclean, MD  History of present illness: Judy Ramos is a 58 y.o. female with the GPA, moderate persistent asthma, and chronic rhinitis presenting today for follow-up.  She was last seen in this clinic in August 2019.  She is excited to report that she was able to taper off of prednisone 3 to 4 months ago.  She is currently taking Symbicort 160-4.5 g, 2 inhalations via spacer device twice daily.  While using the Symbicort and receiving mepolizumab injections, she rarely experiences lower respiratory symptoms and does not experience limitations in normal daily activities.  She had discontinued Xhance and in its place was using azelastine nasal spray, however began to experience nasal congestion and anosmia while on this regimen.  She states that her sense of smell and taste have been restored in the past while taking prednisone.  She restarted Xhance 2 actuations twice daily approximately 10 days ago.  Assessment and plan: Moderate persistent asthma Well-controlled.  Continue Symbicort 160-4.5 g, 2 inhalations via spacer device twice daily, and albuterol HFA, 1 to 2 inhalations every 4-6 hours if needed.  Continue mepolizomab injections as prescribed.  If subjective and objective measures of pulmonary function remain stable, we will consider stepping down therapy on the next visit.  Chronic rhinitis  Continue Xhance, 1-2 actuations per nostril twice daily.  To keep the nasal mucosa moist, use nasal saline gel or spray.  Anosmia The patient's history suggests nasal polyps.  Xhance (as above).  EGPA  Continue mepolizomab injections and treatment plan as outlined above for asthma and chronic rhinitis.   Meds ordered this encounter  Medications  . budesonide-formoterol (SYMBICORT) 160-4.5 MCG/ACT  inhaler    Sig: INHALE TWO PUFFS TWICE DAILY TO PREVENT COUGH OR WHEEZE. RINSE MOUTH AFTER USE.    Dispense:  30.6 Inhaler    Refill:  0    Diagnostics: Spirometry:  Normal with an FEV1 of 97% predicted.  Please see scanned spirometry results for details.    Physical examination: Blood pressure 118/78, pulse 78, temperature 98.1 F (36.7 C), temperature source Oral, resp. rate 16, last menstrual period 08/23/2016, SpO2 98 %.  General: Alert, interactive, in no acute distress. HEENT: TMs pearly gray, turbinates moderately edematous without discharge, post-pharynx unremarkable. Neck: Supple without lymphadenopathy. Lungs: Clear to auscultation without wheezing, rhonchi or rales. CV: Normal S1, S2 without murmurs. Skin: Warm and dry, without lesions or rashes.  The following portions of the patient's history were reviewed and updated as appropriate: allergies, current medications, past family history, past medical history, past social history, past surgical history and problem list.  Allergies as of 10/26/2018      Reactions   Amoxil [amoxicillin]    Imuran [azathioprine] Rash   fever   Penicillins Rash      Medication List       Accurate as of October 26, 2018  5:28 PM. Always use your most recent med list.        Albuterol Sulfate 108 (90 Base) MCG/ACT Aepb Commonly known as:  PROAIR RESPICLICK Inhale 2 puffs into the lungs every 4 (four) hours as needed.   Azelastine HCl 0.15 % Soln azelastine 0.15 % (205.5 mcg) nasal spray   budesonide-formoterol 160-4.5 MCG/ACT inhaler Commonly known as:  SYMBICORT INHALE TWO PUFFS TWICE DAILY TO PREVENT COUGH OR WHEEZE. RINSE MOUTH AFTER USE.  Collagenase Powd 1 Scoop by Does not apply route daily.   EPINEPHrine 0.3 mg/0.3 mL Soaj injection Commonly known as:  EPI-PEN epinephrine 0.3 mg/0.3 mL injection, auto-injector   XHANCE 93 MCG/ACT Exhu Generic drug:  Fluticasone Propionate Xhance 93 mcg/actuation breath activated  aerosol   Fluticasone Propionate 93 MCG/ACT Exhu Commonly known as:  XHANCE Place 2 sprays into the nose 2 (two) times daily.   levothyroxine 50 MCG tablet Commonly known as:  SYNTHROID, LEVOTHROID TAKE 1 TABLET (50 MCG TOTAL) BY MOUTH DAILY BEFORE BREAKFAST.   NUCALA 100 MG/ML Soaj Generic drug:  Mepolizumab Inject 300 mg into the skin every 28 (twenty-eight) days.   pravastatin 40 MG tablet Commonly known as:  PRAVACHOL Take 1 tablet (40 mg total) by mouth daily.   predniSONE 1 MG tablet Commonly known as:  DELTASONE Take 0.5 mg by mouth daily with breakfast.   VITAMIN D (ERGOCALCIFEROL) PO Take 1 tablet by mouth daily.       Allergies  Allergen Reactions  . Amoxil [Amoxicillin]   . Imuran [Azathioprine] Rash    fever  . Penicillins Rash   Review of systems: Review of systems negative except as noted in HPI / PMHx or noted below: Constitutional: Negative.  HENT: Negative.   Eyes: Negative.  Respiratory: Negative.   Cardiovascular: Negative.  Gastrointestinal: Negative.  Genitourinary: Negative.  Musculoskeletal: Negative.  Neurological: Negative.  Endo/Heme/Allergies: Negative.  Cutaneous: Negative.  Past Medical History:  Diagnosis Date  . Gallstone   . Hypothyroidism   . Thyroid nodule     Family History  Problem Relation Age of Onset  . Hypertension Mother   . Arthritis Mother   . Hypertension Father   . Arthritis Father   . Neuropathy Other   . Lymphoma Paternal Uncle   . Cancer Paternal Grandfather   . Stroke Paternal Grandmother     Social History   Socioeconomic History  . Marital status: Married    Spouse name: Not on file  . Number of children: Not on file  . Years of education: Not on file  . Highest education level: Not on file  Occupational History    Employer: SELF EMPLOYED    Comment: p/t with husband  Social Needs  . Financial resource strain: Not on file  . Food insecurity:    Worry: Not on file    Inability: Not on  file  . Transportation needs:    Medical: Not on file    Non-medical: Not on file  Tobacco Use  . Smoking status: Never Smoker  . Smokeless tobacco: Never Used  Substance and Sexual Activity  . Alcohol use: Yes    Alcohol/week: 14.0 standard drinks    Types: 14 Glasses of wine per week  . Drug use: No  . Sexual activity: Yes    Partners: Male  Lifestyle  . Physical activity:    Days per week: Not on file    Minutes per session: Not on file  . Stress: Not on file  Relationships  . Social connections:    Talks on phone: Not on file    Gets together: Not on file    Attends religious service: Not on file    Active member of club or organization: Not on file    Attends meetings of clubs or organizations: Not on file    Relationship status: Not on file  . Intimate partner violence:    Fear of current or ex partner: Not on file    Emotionally  abused: Not on file    Physically abused: Not on file    Forced sexual activity: Not on file  Other Topics Concern  . Not on file  Social History Narrative   Regular exercise- yes     I appreciate the opportunity to take part in Judy Ramos's care. Please do not hesitate to contact me with questions.  Sincerely,   R. Edgar Frisk, MD

## 2018-10-26 NOTE — Assessment & Plan Note (Addendum)
   Continue Xhance, 1-2 actuations per nostril twice daily.  To keep the nasal mucosa moist, use nasal saline gel or spray.

## 2018-10-27 ENCOUNTER — Ambulatory Visit: Payer: BLUE CROSS/BLUE SHIELD

## 2018-10-30 ENCOUNTER — Ambulatory Visit: Payer: Self-pay

## 2018-11-02 DIAGNOSIS — N952 Postmenopausal atrophic vaginitis: Secondary | ICD-10-CM | POA: Diagnosis not present

## 2018-11-11 NOTE — Progress Notes (Addendum)
[place] [facility] [address] [place], [place] [address] [phone]  Date:  11/15/2018   Name:  Judy Ramos   DOB:  [birth year]   MRN:  [id number]  PCP:  Judy Mclean, MD    Chief Complaint: Annual Exam (Pt brings in paperwork to be filled out for insurance. Pt states no concerns.)   History of Present Illness:  Judy Ramos is a 58 y.o. very pleasant female patient who presents with the following:  Here today for a CPE - I have so far seen this pt once in January of 2018 History of moderate persistent asthma, hyperlipidemia, hypothyroidism She also has Erick Alley syndrome managed at [facility] ----------- Churg-Strauss syndrome is a disorder marked by blood vessel inflammation. This inflammation can restrict blood flow to organs and tissues, sometimes permanently damaging them. This condition is also known as eosinophilic granulomatosis with polyangiitis (EGPA). Asthma is the most common sign of Churg-Strauss syndrome. The disorder can also cause other problems, such as hay fever, rash, gastrointestinal bleeding, and pain and numbness in your hands and feet.------------------ Asthma and allergies managed by Dr. Verlin Fester- her asthma and allergy sx are under pretty good control She is on nucala injection symbicort Synthroid Pravastatin  She has 2 adult sons, is married to [place] She works 2 part time jobs, enjoys walking and tennis for exercise She plays tennis as much as she is able- no CP or SOB with exercise  Her sons are at [employer] and [employer].  Her oldest is married   She is taking her pravachol still- will check her lipids today   Mammo:per GYN- last May Pap: per GYN- last May  Her rheum also did a bone density which looked good per her report  Colon: 2016 Immun: declines flu shot and tdap today Labs:  She is fasting   BP Readings from Last 3 Encounters:  11/15/18 98/60  10/26/18 118/78   06/09/18 118/70     Patient Active Problem List   Diagnosis Date Noted  . 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 Osteoporosis 09/02/2016  . Eosinophilia 04/26/2016  . Chronic rhinitis 04/26/2016  . Family history of colonic polyps 02/02/2016  . 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 Asthmatic bronchitis 01/05/2016  . Hyperlipidemia 01/05/2016  . Moderate persistent asthma 11/13/2015  . Perennial allergic rhinitis with a nonallergic component 11/10/2015  . Persistent cough 11/10/2015  . Esophageal reflux 11/10/2015  . Chronic maxillary sinusitis 11/10/2015  . History of asthma 11/10/2015  . Rash 12/23/2010  . IRREGULAR MENSES 01/20/2010  . VARICOSE VEINS LOWER EXTREMITIES W/INFLAMMATION 10/16/2009  . CERVICAL POLYP 10/16/2009  . INSOMNIA 09/05/2008  . Hypothyroid 10/23/2007  . THYROID NODULE 02/08/2007    Past Medical History:  Diagnosis Date  . Gallstone   . Hypothyroidism   . Thyroid nodule     Past Surgical History:  Procedure Laterality Date  . BREAST ENHANCEMENT SURGERY  2001  . CESAREAN SECTION     x 2  . CHOLECYSTECTOMY  05/1999  . DILATION AND CURETTAGE OF UTERUS  2011  . LASIK    . uterine ablation  2011  . VEIN LIGATION AND STRIPPING    . VIDEO BRONCHOSCOPY Bilateral 12/10/2015   Procedure: VIDEO BRONCHOSCOPY WITHOUT FLUORO;  Surgeon: Tanda Rockers, MD;  Location: [facility];  Service: Endoscopy;  Laterality: Bilateral;    Social History   Tobacco Use  . Smoking status: Never Smoker  . Smokeless tobacco: Never Used  Substance Use Topics  . Alcohol use: Yes    Alcohol/week: 14.0 standard drinks    Types: 14 Glasses of wine per week  . Drug use: No    Family History  Problem Relation Age of Onset  . Hypertension Mother   . Arthritis Mother   . Hypertension Father   . Arthritis Father   . Neuropathy Other   . Lymphoma Paternal Uncle   . Cancer Paternal Grandfather   . Stroke Paternal Grandmother     Allergies  Allergen Reactions  . Amoxil [Amoxicillin]   . Imuran  [Azathioprine] Rash    fever  . Penicillins Rash    Medication list has been reviewed and updated.  Current Outpatient Medications on File Prior to Visit  Medication Sig Dispense Refill  . budesonide-formoterol (SYMBICORT) 160-4.5 MCG/ACT inhaler INHALE TWO PUFFS TWICE DAILY TO PREVENT COUGH OR WHEEZE. RINSE MOUTH AFTER USE. 30.6 Inhaler 0  . Collagenase POWD 1 Scoop by Does not apply route daily.    Marland Kitchen EPINEPHrine 0.3 mg/0.3 mL IJ SOAJ injection epinephrine 0.3 mg/0.3 mL injection, auto-injector    . Fluticasone Propionate (XHANCE) 93 MCG/ACT EXHU Place 2 sprays into the nose 2 (two) times daily. 32 mL 12  . levothyroxine (SYNTHROID, LEVOTHROID) 50 MCG tablet TAKE 1 TABLET (50 MCG TOTAL) BY MOUTH DAILY BEFORE BREAKFAST. 90 tablet 2  . Mepolizumab (NUCALA) 100 MG/ML SOAJ Inject 300 mg into the skin every 28 (twenty-eight) days.    . Prasterone (INTRAROSA) 6.5 MG INST Intrarosa 6.5 mg vaginal insert  INSERT 1 VAG INSERT DAILY    . pravastatin (PRAVACHOL) 40 MG tablet Take 1 tablet (40 mg total) by mouth daily. 90 tablet 0  . VITAMIN D, ERGOCALCIFEROL, PO Take 1 tablet by mouth daily.     Current Facility-Administered Medications on File Prior to Visit  Medication Dose Route Frequency Provider Last Rate Last Dose  . Mepolizumab SOLR 300 mg  300 mg Subcutaneous Q28 days Bobbitt, [place] Muta, MD   300 mg at 10/26/18 1557    Review of Systems:  As per HPI- otherwise negative.  Physical Examination: Vitals:   11/15/18 1045  BP: 98/60  Pulse: 66  Resp: 18  Temp: 97.7 F (36.5 C)  SpO2: 100%   Vitals:   11/15/18 1045  Weight: 139 lb 9.6 oz (63.3 kg)  Height: 5\' 5"  (1.651 m)   Body mass index is 23.23 kg/m. Ideal Body Weight: Weight in (lb) to have BMI = 25: 149.9  GEN: WDWN, NAD, Non-toxic, A & O x 3, normal weight, looks well  HEENT: Atraumatic, Normocephalic. Neck supple. No masses, No LAD. Ears and Nose: No external deformity. CV: RRR, No M/G/R. No JVD. No thrill. No  extra heart sounds. PULM: CTA B, no wheezes, crackles, rhonchi. No retractions. No resp. distress. No accessory muscle use. ABD: S, NT, ND, +BS. No rebound. No HSM. EXTR: No c/c/e NEURO Normal gait.  PSYCH: Normally interactive. Conversant. Not depressed or anxious appearing.  Calm demeanor.    Assessment and Plan: Physical exam  Hypothyroidism, unspecified type - Plan: TSH  Hyperlipidemia, unspecified hyperlipidemia type - Plan: Lipid panel, pravastatin (PRAVACHOL) 40 MG tablet  Encounter for hepatitis C screening test for low risk patient - Plan: Hepatitis C antibody  Screening for diabetes mellitus - Plan: Comprehensive metabolic panel, Hemoglobin A1c  Screening for deficiency anemia - Plan: CBC  Complete form and fax in with labs Physical exam- doing well today Routine labs pending She declines flu/ tetanus vaccine today.  Went over indications for urgent tetanus immunization  Will plan further follow- up pending labs.   Signed Lamar Blinks, MD  Received her labs- completed her insurance ppw and message to pt  Cholesterol reduced by 60 points since 2018 Results for orders placed or performed in visit on 11/15/18  Lipid panel  Result Value Ref Range   Cholesterol 190 0 - 200 mg/dL   Triglycerides 69.0 0.0 - 149.0 mg/dL   HDL 76.50 >39.00 mg/dL   VLDL 13.8 0.0 - 40.0 mg/dL   LDL Cholesterol 100 (H) 0 - 99 mg/dL   Total CHOL/HDL Ratio 2    NonHDL 113.95   TSH  Result Value Ref Range   TSH 0.71 0.35 - 4.50 uIU/mL  CBC  Result Value Ref Range   WBC 4.5 4.0 - 10.5 K/uL   RBC 3.95 3.87 - 5.11 Mil/uL   Platelets 283.0 150.0 - 400.0 K/uL   Hemoglobin 13.2 12.0 - 15.0 g/dL   HCT 38.9 36.0 - 46.0 %   MCV 98.5 78.0 - 100.0 fl   MCHC 34.0 30.0 - 36.0 g/dL   RDW 13.1 11.5 - 15.5 %  Comprehensive metabolic panel  Result Value Ref Range   Sodium 134 (L) 135 - 145 mEq/L   Potassium 3.8 3.5 - 5.1 mEq/L   Chloride 96 96 - 112 mEq/L   CO2 29 19 - 32 mEq/L   Glucose,  Bld 98 70 - 99 mg/dL   BUN 13 6 - 23 mg/dL   Creatinine, Ser 0.77 0.40 - 1.20 mg/dL   Total Bilirubin 0.6 0.2 - 1.2 mg/dL   Alkaline Phosphatase 77 39 - 117 U/L   AST 19 0 - 37 U/L   ALT 14 0 - 35 U/L   Total Protein 7.4 6.0 - 8.3 g/dL   Albumin 4.7 3.5 - 5.2 g/dL   Calcium 9.6 8.4 - 10.5 mg/dL   GFR 77.70 >60.00 mL/min  Hemoglobin A1c  Result Value Ref Range   Hgb A1c MFr Bld 5.6 4.6 - 6.5 %

## 2018-11-13 DIAGNOSIS — D2272 Melanocytic nevi of left lower limb, including hip: Secondary | ICD-10-CM | POA: Diagnosis not present

## 2018-11-13 DIAGNOSIS — L578 Other skin changes due to chronic exposure to nonionizing radiation: Secondary | ICD-10-CM | POA: Diagnosis not present

## 2018-11-13 DIAGNOSIS — Q825 Congenital non-neoplastic nevus: Secondary | ICD-10-CM | POA: Diagnosis not present

## 2018-11-15 ENCOUNTER — Ambulatory Visit (INDEPENDENT_AMBULATORY_CARE_PROVIDER_SITE_OTHER): Payer: Commercial Managed Care - PPO | Admitting: Family Medicine

## 2018-11-15 ENCOUNTER — Encounter: Payer: Self-pay | Admitting: Family Medicine

## 2018-11-15 ENCOUNTER — Other Ambulatory Visit: Payer: Self-pay

## 2018-11-15 VITALS — BP 120/70 | HR 66 | Temp 97.7°F | Resp 18 | Ht 65.0 in | Wt 139.6 lb

## 2018-11-15 DIAGNOSIS — Z0000 Encounter for general adult medical examination without abnormal findings: Secondary | ICD-10-CM | POA: Diagnosis not present

## 2018-11-15 DIAGNOSIS — Z1159 Encounter for screening for other viral diseases: Secondary | ICD-10-CM

## 2018-11-15 DIAGNOSIS — Z131 Encounter for screening for diabetes mellitus: Secondary | ICD-10-CM | POA: Diagnosis not present

## 2018-11-15 DIAGNOSIS — E785 Hyperlipidemia, unspecified: Secondary | ICD-10-CM | POA: Diagnosis not present

## 2018-11-15 DIAGNOSIS — E039 Hypothyroidism, unspecified: Secondary | ICD-10-CM | POA: Diagnosis not present

## 2018-11-15 DIAGNOSIS — Z130 Encounter for screening for diseases of the blood and blood-forming organs and certain disorders involving the immune mechanism: Secondary | ICD-10-CM

## 2018-11-15 LAB — COMPREHENSIVE METABOLIC PANEL
ALT: 14 U/L (ref 0–35)
AST: 19 U/L (ref 0–37)
Albumin: 4.7 g/dL (ref 3.5–5.2)
Alkaline Phosphatase: 77 U/L (ref 39–117)
BUN: 13 mg/dL (ref 6–23)
CHLORIDE: 96 meq/L (ref 96–112)
CO2: 29 mEq/L (ref 19–32)
Calcium: 9.6 mg/dL (ref 8.4–10.5)
Creatinine, Ser: 0.77 mg/dL (ref 0.40–1.20)
GFR: 77.7 mL/min (ref >60.00)
Glucose, Bld: 98 mg/dL (ref 70–99)
Potassium: 3.8 mEq/L (ref 3.5–5.1)
Sodium: 134 mEq/L — ABNORMAL LOW (ref 135–145)
Total Bilirubin: 0.6 mg/dL (ref 0.2–1.2)
Total Protein: 7.4 g/dL (ref 6.0–8.3)

## 2018-11-15 LAB — LIPID PANEL
Cholesterol: 190 mg/dL (ref 0–200)
HDL: 76.5 mg/dL (ref >39.00)
LDL Cholesterol: 100 mg/dL — ABNORMAL HIGH (ref 0–99)
NonHDL: 113.95
Total CHOL/HDL Ratio: 2
Triglycerides: 69 mg/dL (ref 0.0–149.0)
VLDL: 13.8 mg/dL (ref 0.0–40.0)

## 2018-11-15 LAB — CBC
HCT: 38.9 % (ref 36.0–46.0)
Hemoglobin: 13.2 g/dL (ref 12.0–15.0)
MCHC: 34 g/dL (ref 30.0–36.0)
MCV: 98.5 fl (ref 78.0–100.0)
Platelets: 283 10*3/uL (ref 150.0–400.0)
RBC: 3.95 Mil/uL (ref 3.87–5.11)
RDW: 13.1 % (ref 11.5–15.5)
WBC: 4.5 10*3/uL (ref 4.0–10.5)

## 2018-11-15 LAB — HEMOGLOBIN A1C: HEMOGLOBIN A1C: 5.6 % (ref 4.6–6.5)

## 2018-11-15 LAB — TSH: TSH: 0.71 u[IU]/mL (ref 0.35–4.50)

## 2018-11-15 MED ORDER — PRAVASTATIN SODIUM 40 MG PO TABS: 40.0000 mg | ORAL_TABLET | Freq: Every day | ORAL | 3 refills | Status: DC

## 2018-11-15 NOTE — Patient Instructions (Signed)
Great to see you as always I will be in touch with your labs asap  Please do get your dental exam when you can Continue to stay active and try to eat a clean diet You are due for a tetanus vaccine- please have this done at your convenience.  Can be given at your drug store In any dirty wound have it done right away    Health Maintenance, Female Adopting a healthy lifestyle and getting preventive care can go a long way to promote health and wellness. Talk with your health care provider about what schedule of regular examinations is right for you. This is a good chance for you to check in with your provider about disease prevention and staying healthy. In between checkups, there are plenty of things you can do on your own. Experts have done a lot of research about which lifestyle changes and preventive measures are most likely to keep you healthy. Ask your health care provider for more information. Weight and diet Eat a healthy diet  Be sure to include plenty of vegetables, fruits, low-fat dairy products, and lean protein.  Do not eat a lot of foods high in solid fats, added sugars, or salt.  Get regular exercise. This is one of the most important things you can do for your health. ? Most adults should exercise for at least 150 minutes each week. The exercise should increase your heart rate and make you sweat (moderate-intensity exercise). ? Most adults should also do strengthening exercises at least twice a week. This is in addition to the moderate-intensity exercise. Maintain a healthy weight  Body mass index (BMI) is a measurement that can be used to identify possible weight problems. It estimates body fat based on height and weight. Your health care provider can help determine your BMI and help you achieve or maintain a healthy weight.  For females 33 years of age and older: ? A BMI below 18.5 is considered underweight. ? A BMI of 18.5 to 24.9 is normal. ? A BMI of 25 to 29.9 is  considered overweight. ? A BMI of 30 and above is considered obese. Watch levels of cholesterol and blood lipids  You should start having your blood tested for lipids and cholesterol at 56 years of age, then have this test every 5 years.  You may need to have your cholesterol levels checked more often if: ? Your lipid or cholesterol levels are high. ? You are older than 56 years of age. ? You are at high risk for heart disease. Cancer screening Lung Cancer  Lung cancer screening is recommended for adults 56-27 years old who are at high risk for lung cancer because of a history of smoking.  A yearly low-dose CT scan of the lungs is recommended for people who: ? Currently smoke. ? Have quit within the past 15 years. ? Have at least a 30-pack-year history of smoking. A pack year is smoking an average of one pack of cigarettes a day for 1 year.  Yearly screening should continue until it has been 15 years since you quit.  Yearly screening should stop if you develop a health problem that would prevent you from having lung cancer treatment. Breast Cancer  Practice breast self-awareness. This means understanding how your breasts normally appear and feel.  It also means doing regular breast self-exams. Let your health care provider know about any changes, no matter how small.  If you are in your 20s or 30s, you should have a  clinical breast exam (CBE) by a health care provider every 1-3 years as part of a regular health exam.  If you are 64 or older, have a CBE every year. Also consider having a breast X-ray (mammogram) every year.  If you have a family history of breast cancer, talk to your health care provider about genetic screening.  If you are at high risk for breast cancer, talk to your health care provider about having an MRI and a mammogram every year.  Breast cancer gene (BRCA) assessment is recommended for women who have family members with BRCA-related cancers. BRCA-related  cancers include: ? Breast. ? Ovarian. ? Tubal. ? Peritoneal cancers.  Results of the assessment will determine the need for genetic counseling and BRCA1 and BRCA2 testing. Cervical Cancer Your health care provider may recommend that you be screened regularly for cancer of the pelvic organs (ovaries, uterus, and vagina). This screening involves a pelvic examination, including checking for microscopic changes to the surface of your cervix (Pap test). You may be encouraged to have this screening done every 3 years, beginning at age 61.  For women ages 23-65, health care providers may recommend pelvic exams and Pap testing every 3 years, or they may recommend the Pap and pelvic exam, combined with testing for human papilloma virus (HPV), every 5 years. Some types of HPV increase your risk of cervical cancer. Testing for HPV may also be done on women of any age with unclear Pap test results.  Other health care providers may not recommend any screening for nonpregnant women who are considered low risk for pelvic cancer and who do not have symptoms. Ask your health care provider if a screening pelvic exam is right for you.  If you have had past treatment for cervical cancer or a condition that could lead to cancer, you need Pap tests and screening for cancer for at least 20 years after your treatment. If Pap tests have been discontinued, your risk factors (such as having a new sexual partner) need to be reassessed to determine if screening should resume. Some women have medical problems that increase the chance of getting cervical cancer. In these cases, your health care provider may recommend more frequent screening and Pap tests. Colorectal Cancer  This type of cancer can be detected and often prevented.  Routine colorectal cancer screening usually begins at 56 years of age and continues through 56 years of age.  Your health care provider may recommend screening at an earlier age if you have risk  factors for colon cancer.  Your health care provider may also recommend using home test kits to check for hidden blood in the stool.  A small camera at the end of a tube can be used to examine your colon directly (sigmoidoscopy or colonoscopy). This is done to check for the earliest forms of colorectal cancer.  Routine screening usually begins at age 19.  Direct examination of the colon should be repeated every 5-10 years through 56 years of age. However, you may need to be screened more often if early forms of precancerous polyps or small growths are found. Skin Cancer  Check your skin from head to toe regularly.  Tell your health care provider about any new moles or changes in moles, especially if there is a change in a mole's shape or color.  Also tell your health care provider if you have a mole that is larger than the size of a pencil eraser.  Always use sunscreen. Apply sunscreen liberally  and repeatedly throughout the day.  Protect yourself by wearing long sleeves, pants, a wide-brimmed hat, and sunglasses whenever you are outside. Heart disease, diabetes, and high blood pressure  High blood pressure causes heart disease and increases the risk of stroke. High blood pressure is more likely to develop in: ? People who have blood pressure in the high end of the normal range (130-139/85-89 mm Hg). ? People who are overweight or obese. ? People who are African American.  If you are 18-64 years of age, have your blood pressure checked every 3-5 years. If you are 1 years of age or older, have your blood pressure checked every year. You should have your blood pressure measured twice-once when you are at a hospital or clinic, and once when you are not at a hospital or clinic. Record the average of the two measurements. To check your blood pressure when you are not at a hospital or clinic, you can use: ? An automated blood pressure machine at a pharmacy. ? A home blood pressure  monitor.  If you are between 5 years and 71 years old, ask your health care provider if you should take aspirin to prevent strokes.  Have regular diabetes screenings. This involves taking a blood sample to check your fasting blood sugar level. ? If you are at a normal weight and have a low risk for diabetes, have this test once every three years after 57 years of age. ? If you are overweight and have a high risk for diabetes, consider being tested at a younger age or more often. Preventing infection Hepatitis B  If you have a higher risk for hepatitis B, you should be screened for this virus. You are considered at high risk for hepatitis B if: ? You were born in a country where hepatitis B is common. Ask your health care provider which countries are considered high risk. ? Your parents were born in a high-risk country, and you have not been immunized against hepatitis B (hepatitis B vaccine). ? You have HIV or AIDS. ? You use needles to inject street drugs. ? You live with someone who has hepatitis B. ? You have had sex with someone who has hepatitis B. ? You get hemodialysis treatment. ? You take certain medicines for conditions, including cancer, organ transplantation, and autoimmune conditions. Hepatitis C  Blood testing is recommended for: ? Everyone born from [birth year] through 1963. ? Anyone with known risk factors for hepatitis C. Sexually transmitted infections (STIs)  You should be screened for sexually transmitted infections (STIs) including gonorrhea and chlamydia if: ? You are sexually active and are younger than 56 years of age. ? You are older than 56 years of age and your health care provider tells you that you are at risk for this type of infection. ? Your sexual activity has changed since you were last screened and you are at an increased risk for chlamydia or gonorrhea. Ask your health care provider if you are at risk.  If you do not have HIV, but are at risk, it may be  recommended that you take a prescription medicine daily to prevent HIV infection. This is called pre-exposure prophylaxis (PrEP). You are considered at risk if: ? You are sexually active and do not regularly use condoms or know the HIV status of your partner(s). ? You take drugs by injection. ? You are sexually active with a partner who has HIV. Talk with your health care provider about whether you are at high  risk of being infected with HIV. If you choose to begin PrEP, you should first be tested for HIV. You should then be tested every 3 months for as long as you are taking PrEP. Pregnancy  If you are premenopausal and you may become pregnant, ask your health care provider about preconception counseling.  If you may become pregnant, take 400 to 800 micrograms (mcg) of folic acid every day.  If you want to prevent pregnancy, talk to your health care provider about birth control (contraception). Osteoporosis and menopause  Osteoporosis is a disease in which the bones lose minerals and strength with aging. This can result in serious bone fractures. Your risk for osteoporosis can be identified using a bone density scan.  If you are 52 years of age or older, or if you are at risk for osteoporosis and fractures, ask your health care provider if you should be screened.  Ask your health care provider whether you should take a calcium or vitamin D supplement to lower your risk for osteoporosis.  Menopause may have certain physical symptoms and risks.  Hormone replacement therapy may reduce some of these symptoms and risks. Talk to your health care provider about whether hormone replacement therapy is right for you. Follow these instructions at home:  Schedule regular health, dental, and eye exams.  Stay current with your immunizations.  Do not use any tobacco products including cigarettes, chewing tobacco, or electronic cigarettes.  If you are pregnant, do not drink alcohol.  If you are  breastfeeding, limit how much and how often you drink alcohol.  Limit alcohol intake to no more than 1 drink per day for nonpregnant women. One drink equals 12 ounces of beer, 5 ounces of wine, or 1 ounces of hard liquor.  Do not use street drugs.  Do not share needles.  Ask your health care provider for help if you need support or information about quitting drugs.  Tell your health care provider if you often feel depressed.  Tell your health care provider if you have ever been abused or do not feel safe at home. This information is not intended to replace advice given to you by your health care provider. Make sure you discuss any questions you have with your health care provider. Document Released: 03/01/2011 Document Revised: 01/22/2016 Document Reviewed: 05/20/2015 Elsevier Interactive Patient Education  2019 [employer].

## 2018-11-16 LAB — HEPATITIS C ANTIBODY
Hepatitis C Ab: NONREACTIVE
SIGNAL TO CUT-OFF: 0.01 (ref <1.00)

## 2018-11-23 ENCOUNTER — Other Ambulatory Visit: Payer: Self-pay

## 2018-11-23 ENCOUNTER — Ambulatory Visit (INDEPENDENT_AMBULATORY_CARE_PROVIDER_SITE_OTHER): Payer: Commercial Managed Care - PPO

## 2018-11-23 DIAGNOSIS — J4540 Moderate persistent asthma, uncomplicated: Secondary | ICD-10-CM

## 2018-11-23 DIAGNOSIS — M301 Polyarteritis with lung involvement [Churg-Strauss]: Secondary | ICD-10-CM | POA: Diagnosis not present

## 2018-11-27 ENCOUNTER — Telehealth: Payer: Self-pay

## 2018-11-27 NOTE — Telephone Encounter (Signed)
PA WAS DENIED FOR XHANCE 2 sprays per nostril twice daily. Marleena from [employer] said that pt can get it approved if we switch pt to 1 spray twice a day. Is it ok to change?  [email or web address]

## 2018-11-27 NOTE — Telephone Encounter (Signed)
Yes, that is fine. 

## 2018-11-28 NOTE — Telephone Encounter (Signed)
Will let marleena know

## 2018-11-30 ENCOUNTER — Other Ambulatory Visit: Payer: Self-pay

## 2018-11-30 ENCOUNTER — Telehealth: Payer: Commercial Managed Care - PPO | Admitting: Physician Assistant

## 2018-11-30 DIAGNOSIS — R21 Rash and other nonspecific skin eruption: Secondary | ICD-10-CM

## 2018-11-30 MED ORDER — FLUTICASONE PROPIONATE 93 MCG/ACT NA EXHU: 1.0000 | INHALANT_SUSPENSION | Freq: Two times a day (BID) | NASAL | 12 refills | Status: DC

## 2018-11-30 MED ORDER — PERMETHRIN 5 % EX CREA: TOPICAL_CREAM | CUTANEOUS | 0 refills | Status: DC

## 2018-11-30 NOTE — Progress Notes (Signed)
E Visit for Rash  We are sorry that you are not feeling well. Here is how we plan to help!  Based on what you have shared and the pictures sent, your rash seems more consistent with bites of some kind. It is hard to be more specific without examining in person.   Wash bed linens and used towels/clothing in hot water. Store in an Programmer, systems and re-wash in 7 days before reusing (if able).  Take an over the counter antihistamine like Benadryl (at night) and Claritin (in the morning) to help with itch. Over-the-counter Sarna Lotion can also really help with the itch.  I am sending in a prescription for permethrin cream to apply from neck down (avoiding perineal region) at night and wash off in the morning. This should help remove any mite/egg, etc left on the body.   If you have pets, watch them closely as they can be the culprits for transmission of this. Sometimes others in the household are effected as well but not always as we all do not react to the same things.   If not calming down, please follow-up with Dr. Edilia Bo.   HOME CARE:   Take cool showers and avoid direct sunlight.  Apply cool compress or wet dressings.  Take a bath in an oatmeal bath.  Sprinkle content of one Aveeno packet under running faucet with comfortably warm water.  Bathe for 15-20 minutes, 1-2 times daily.  Pat dry with a towel. Do not rub the rash.  Use hydrocortisone cream.  Take an antihistamine like Benadryl for widespread rashes that itch.  The adult dose of Benadryl is 25-50 mg by mouth 4 times daily.  Caution:  This type of medication may cause sleepiness.  Do not drink alcohol, drive, or operate dangerous machinery while taking antihistamines.  Do not take these medications if you have prostate enlargement.  Read package instructions thoroughly on all medications that you take.  GET HELP RIGHT AWAY IF:   Symptoms don't go away after treatment.  Severe itching that persists.  If you rash  spreads or swells.  If you rash begins to smell.  If it blisters and opens or develops a yellow-brown crust.  You develop a fever.  You have a sore throat.  You become short of breath.  MAKE SURE YOU:  Understand these instructions. Will watch your condition. Will get help right away if you are not doing well or get worse.  Thank you for choosing an e-visit. Your e-visit answers were reviewed by a board certified advanced clinical practitioner to complete your personal care plan. Depending upon the condition, your plan could have included both over the counter or prescription medications. Please review your pharmacy choice. Be sure that the pharmacy you have chosen is open so that you can pick up your prescription now.  If there is a problem you may message your provider in [place] [facility] to have the prescription routed to another pharmacy. Your safety is important to [place]. If you have drug allergies check your prescription carefully.  For the next 24 hours, you can use MyChart to ask questions about today's visit, request a non-urgent call back, or ask for a work or school excuse from your e-visit provider. You will get an email in the next two days asking about your experience. I hope that your e-visit has been valuable and will speed your recovery.

## 2018-11-30 NOTE — Progress Notes (Signed)
I have spent 5 minutes in review of e-visit questionnaire, review and updating patient chart, medical decision making and response to patient.   Judy Giammarco Cody Esiquio Boesen, PA-C    

## 2018-12-06 DIAGNOSIS — L508 Other urticaria: Secondary | ICD-10-CM | POA: Diagnosis not present

## 2018-12-21 ENCOUNTER — Telehealth: Payer: Self-pay

## 2018-12-21 ENCOUNTER — Ambulatory Visit (INDEPENDENT_AMBULATORY_CARE_PROVIDER_SITE_OTHER): Payer: Commercial Managed Care - PPO

## 2018-12-21 ENCOUNTER — Other Ambulatory Visit: Payer: Self-pay

## 2018-12-21 DIAGNOSIS — M301 Polyarteritis with lung involvement [Churg-Strauss]: Secondary | ICD-10-CM

## 2018-12-21 DIAGNOSIS — J4550 Severe persistent asthma, uncomplicated: Secondary | ICD-10-CM

## 2018-12-21 DIAGNOSIS — D7218 Eosinophilia in diseases classified elsewhere: Secondary | ICD-10-CM

## 2018-12-21 MED ORDER — BUDESONIDE-FORMOTEROL FUMARATE 160-4.5 MCG/ACT IN AERO: INHALATION_SPRAY | RESPIRATORY_TRACT | 3 refills | Status: DC

## 2018-12-21 NOTE — Telephone Encounter (Signed)
Left message for pt. That I sent in her Symbicort 160 mcg into the [facility], if she wants it sent into the [facility] on [address] I'll send it in there. I told pt. I sent the Symbicort 160 mcg to the last drug store that was on file in my computer.

## 2019-01-10 ENCOUNTER — Other Ambulatory Visit: Payer: Self-pay

## 2019-01-10 DIAGNOSIS — E785 Hyperlipidemia, unspecified: Secondary | ICD-10-CM

## 2019-01-10 MED ORDER — LEVOTHYROXINE SODIUM 50 MCG PO TABS: 50.0000 ug | ORAL_TABLET | Freq: Every day | ORAL | 2 refills | Status: DC

## 2019-01-10 MED ORDER — PRAVASTATIN SODIUM 40 MG PO TABS: 40.0000 mg | ORAL_TABLET | Freq: Every day | ORAL | 3 refills | Status: DC

## 2019-01-10 MED ORDER — BUDESONIDE-FORMOTEROL FUMARATE 160-4.5 MCG/ACT IN AERO: INHALATION_SPRAY | RESPIRATORY_TRACT | 0 refills | Status: DC

## 2019-01-10 NOTE — Addendum Note (Signed)
Addended by: Lucrezia Starch I on: 01/10/2019 02:17 PM   Modules accepted: Orders

## 2019-01-18 ENCOUNTER — Other Ambulatory Visit: Payer: Self-pay

## 2019-01-18 ENCOUNTER — Ambulatory Visit (INDEPENDENT_AMBULATORY_CARE_PROVIDER_SITE_OTHER): Payer: Commercial Managed Care - PPO

## 2019-01-18 DIAGNOSIS — M301 Polyarteritis with lung involvement [Churg-Strauss]: Secondary | ICD-10-CM | POA: Diagnosis not present

## 2019-01-18 DIAGNOSIS — J4550 Severe persistent asthma, uncomplicated: Secondary | ICD-10-CM

## 2019-01-30 DIAGNOSIS — M1990 Unspecified osteoarthritis, unspecified site: Secondary | ICD-10-CM | POA: Insufficient documentation

## 2019-01-31 ENCOUNTER — Other Ambulatory Visit: Payer: Self-pay | Admitting: Obstetrics and Gynecology

## 2019-01-31 DIAGNOSIS — R928 Other abnormal and inconclusive findings on diagnostic imaging of breast: Secondary | ICD-10-CM

## 2019-02-06 ENCOUNTER — Ambulatory Visit
Admission: RE | Admit: 2019-02-06 | Discharge: 2019-02-06 | Disposition: A | Discharge status: Home / Self Care | Payer: Commercial Managed Care - PPO | Source: Ambulatory Visit | Attending: Obstetrics and Gynecology | Admitting: Obstetrics and Gynecology

## 2019-02-06 ENCOUNTER — Other Ambulatory Visit: Payer: Self-pay

## 2019-02-06 ENCOUNTER — Ambulatory Visit: Payer: Self-pay

## 2019-02-06 ENCOUNTER — Other Ambulatory Visit: Payer: Self-pay | Admitting: Obstetrics and Gynecology

## 2019-02-06 DIAGNOSIS — R928 Other abnormal and inconclusive findings on diagnostic imaging of breast: Secondary | ICD-10-CM

## 2019-02-15 ENCOUNTER — Ambulatory Visit (INDEPENDENT_AMBULATORY_CARE_PROVIDER_SITE_OTHER): Payer: Commercial Managed Care - PPO

## 2019-02-15 ENCOUNTER — Other Ambulatory Visit: Payer: Self-pay

## 2019-02-15 DIAGNOSIS — J4550 Severe persistent asthma, uncomplicated: Secondary | ICD-10-CM

## 2019-02-15 DIAGNOSIS — M301 Polyarteritis with lung involvement [Churg-Strauss]: Secondary | ICD-10-CM | POA: Diagnosis not present

## 2019-02-15 NOTE — Progress Notes (Signed)
Immunotherapy   Patient Details  Name: Judy Ramos MRN: [id number] Date of Birth: [birth year]  02/15/2019  [name] Pt brought auto injectors in and was administered via nurse.  Following schedule: Nucala  Frequency: Every 4 weeks Epi-Pen: Yes  Consent signed and patient instructions given.   Isabel Caprice 02/15/2019, 2:27 PM

## 2019-03-13 ENCOUNTER — Other Ambulatory Visit: Payer: Self-pay | Admitting: Allergy and Immunology

## 2019-03-15 ENCOUNTER — Ambulatory Visit (INDEPENDENT_AMBULATORY_CARE_PROVIDER_SITE_OTHER): Payer: Commercial Managed Care - PPO

## 2019-03-15 DIAGNOSIS — J4550 Severe persistent asthma, uncomplicated: Secondary | ICD-10-CM

## 2019-03-28 ENCOUNTER — Ambulatory Visit (INDEPENDENT_AMBULATORY_CARE_PROVIDER_SITE_OTHER): Payer: Commercial Managed Care - PPO | Admitting: Allergy and Immunology

## 2019-03-28 ENCOUNTER — Encounter: Payer: Self-pay | Admitting: Allergy and Immunology

## 2019-03-28 ENCOUNTER — Other Ambulatory Visit: Payer: Self-pay

## 2019-03-28 VITALS — BP 118/68 | HR 83 | Temp 98.1°F | Resp 18 | Ht 65.8 in | Wt 141.8 lb

## 2019-03-28 DIAGNOSIS — R430 Anosmia: Secondary | ICD-10-CM

## 2019-03-28 DIAGNOSIS — M301 Polyarteritis with lung involvement [Churg-Strauss]: Secondary | ICD-10-CM | POA: Diagnosis not present

## 2019-03-28 DIAGNOSIS — J310 Chronic rhinitis: Secondary | ICD-10-CM | POA: Diagnosis not present

## 2019-03-28 DIAGNOSIS — D7218 Eosinophilia in diseases classified elsewhere: Secondary | ICD-10-CM

## 2019-03-28 DIAGNOSIS — J4540 Moderate persistent asthma, uncomplicated: Secondary | ICD-10-CM

## 2019-03-28 MED ORDER — BUDESONIDE-FORMOTEROL FUMARATE 80-4.5 MCG/ACT IN AERO: 2.0000 | INHALATION_SPRAY | Freq: Two times a day (BID) | RESPIRATORY_TRACT | 5 refills | Status: DC

## 2019-03-28 NOTE — Assessment & Plan Note (Signed)
   Nasal saline spray or rinse if needed.  If needed, add Xhance nasal spray.

## 2019-03-28 NOTE — Assessment & Plan Note (Signed)
   As this problem did not improve with prednisone or Xhance, a referral has been provided to otolaryngology, Dr. Benjamine Mola, for further evaluation.

## 2019-03-28 NOTE — Assessment & Plan Note (Signed)
   Continue mepolizomab injections and treatment plan as outlined above for asthma and chronic rhinitis.

## 2019-03-28 NOTE — Assessment & Plan Note (Deleted)
   As this problem did not improve with prednisone or Xhance, a referral has been provided to otolaryngology, Dr. Benjamine Mola, for further evaluation.

## 2019-03-28 NOTE — Progress Notes (Signed)
Follow-up Note  RE: Judy Ramos MRN: [id number] DOB: [birth year] Date of Office Visit: 03/28/2019  Primary care provider: Darreld Mclean, MD Referring provider: Darreld Mclean, MD  History of present illness: Judy Ramos is a 58 y.o. 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  She was last seen in this clinic on October 26, 2018.  She reports that in the interval since her previous visit her asthma has been well controlled with mepolizomab injections and Symbicort 160-4.5 g, 2 inhalations via spacer device twice daily.  She rarely requires albuterol rescue and does not experience limitations in normal daily activities or nocturnal awakenings due to lower respiratory symptoms.  She is concerned that she still has not regained her sense of smell.  She states that on occasion she will briefly regain her sense of smell, however then it disappears again.  She has experienced anosmia despite prolonged prednisone use and Xhance nasal spray.  She is interested in further evaluation of the anosmia.  She has discontinued Xhance nasal spray at this point because she has not had significant nasal congestion or sinus pressure and the Xhance has caused her nose to feel dry despite using saline gel as needed.  Assessment and plan: Moderate persistent asthma Well-controlled.  We will stepdown therapy at this time. A prescription has been provided for Symbicort (budesonide/formoterol) 80/4.5 g, 2 inhalations via spacer device twice a day.  If lower respiratory symptoms progress in frequency and/or severity, the patient is to resume Symbicort 160. During respiratory tract infections and asthma flares, switch to Symbicort 160-4.5 mcg, 2 inhalations via spacer device twice daily until symptoms have returned to baseline.  Continue mepolizomab injections as prescribed and albuterol HFA, 1 to 2 inhalations every 4-6 hours if needed.  Subjective  and objective measures of pulmonary function will be followed and the treatment plan will be adjusted accordingly.  Chronic rhinitis  Nasal saline spray or rinse if needed.  If needed, add Xhance nasal spray.  EGPA  Continue mepolizomab injections and treatment plan as outlined above for asthma and chronic rhinitis.  Anosmia  As this problem did not improve with prednisone or Xhance, a referral has been provided to otolaryngology, Dr. Benjamine Mola, for further evaluation.   Meds ordered this encounter  Medications  . budesonide-formoterol (SYMBICORT) 80-4.5 MCG/ACT inhaler    Sig: Inhale 2 puffs into the lungs 2 (two) times daily.    Dispense:  1 Inhaler    Refill:  5    Diagnostics:  Spirometry:  Normal with an FEV1 of 98% predicted. This study was performed while the patient was asymptomatic.  Please see scanned spirometry results for details.  Physical examination: Blood pressure 118/68, pulse 83, temperature 98.1 F (36.7 C), temperature source Oral, resp. rate 18, height 5' 5.8" (1.671 m), weight 141 lb 12.8 oz (64.3 kg), last menstrual period 08/23/2016, SpO2 97 %.  General: Alert, interactive, in no acute distress. HEENT: TMs pearly gray, turbinates mildly edematous without discharge, post-pharynx moderately erythematous. Neck: Supple without lymphadenopathy. Lungs: Clear to auscultation without wheezing, rhonchi or rales. CV: Normal S1, S2 without murmurs. Skin: Warm and dry, without lesions or rashes.  The following portions of the patient's history were reviewed and updated as appropriate: allergies, current medications, past family history, past medical history, past social history, past surgical history and problem list.  Allergies as of 03/28/2019      Reactions   Amoxil [amoxicillin]    Imuran [azathioprine] Rash  fever   Penicillins Rash      Medication List       Accurate as of March 28, 2019  9:21 PM. If you have any questions, ask your nurse or doctor.         STOP taking these medications   budesonide-formoterol 160-4.5 MCG/ACT inhaler Commonly known as: Symbicort Replaced by: budesonide-formoterol 80-4.5 MCG/ACT inhaler Stopped by: Edmonia Lynch, MD     TAKE these medications   budesonide-formoterol 80-4.5 MCG/ACT inhaler Commonly known as: Symbicort Inhale 2 puffs into the lungs 2 (two) times daily. Replaces: budesonide-formoterol 160-4.5 MCG/ACT inhaler Started by: Edmonia Lynch, MD   Collagenase Powd 1 Scoop by Does not apply route daily.   EPINEPHrine 0.3 mg/0.3 mL Soaj injection Commonly known as: EPI-PEN epinephrine 0.3 mg/0.3 mL injection, auto-injector   Fluticasone Propionate 93 MCG/ACT Exhu Commonly known as: Xhance Place 2 sprays into the nose 2 (two) times daily.   Fluticasone Propionate 93 MCG/ACT Exhu Commonly known as: Xhance Place 1 spray into the nose 2 (two) times daily.   Intrarosa 6.5 MG Inst Generic drug: Prasterone Intrarosa 6.5 mg vaginal insert  INSERT 1 VAG INSERT DAILY   levothyroxine 50 MCG tablet Commonly known as: SYNTHROID Take 1 tablet (50 mcg total) by mouth daily before breakfast.   Nucala 100 MG/ML Soaj Generic drug: Mepolizumab Inject 300 mg into the skin every 28 (twenty-eight) days.   permethrin 5 % cream Commonly known as: ELIMITE Apply thin layer from neck down excluding perineal areas at night. Wash off first thing in the morning. May repeat once in a week.   pravastatin 40 MG tablet Commonly known as: PRAVACHOL Take 1 tablet (40 mg total) by mouth daily.   VITAMIN D (ERGOCALCIFEROL) PO Take 1 tablet by mouth daily.       Allergies  Allergen Reactions  . Amoxil [Amoxicillin]   . Imuran [Azathioprine] Rash    fever  . Penicillins Rash    Review of systems: Review of systems negative except as noted in HPI / PMHx or noted below: Constitutional: Negative.  HENT: Negative.   Eyes: Negative.  Respiratory: Negative.   Cardiovascular: Negative.   Gastrointestinal: Negative.  Genitourinary: Negative.  Musculoskeletal: Negative.  Neurological: Negative.  Endo/Heme/Allergies: Negative.  Cutaneous: Negative.  Past Medical History:  Diagnosis Date  . Gallstone   . Hypothyroidism   . Thyroid nodule     Family History  Problem Relation Age of Onset  . Hypertension Mother   . Arthritis Mother   . Hypertension Father   . Arthritis Father   . Neuropathy Other   . Lymphoma Paternal Uncle   . Cancer Paternal Grandfather   . Stroke Paternal Grandmother     Social History   Socioeconomic History  . Marital status: Married    Spouse name: Not on file  . Number of children: Not on file  . Years of education: Not on file  . Highest education level: Not on file  Occupational History    Employer: SELF EMPLOYED    Comment: p/t with husband  Social Needs  . Financial resource strain: Not on file  . Food insecurity    Worry: Not on file    Inability: Not on file  . Transportation needs    Medical: Not on file    Non-medical: Not on file  Tobacco Use  . Smoking status: Never Smoker  . Smokeless tobacco: Never Used  Substance and Sexual Activity  . Alcohol use: Yes  Alcohol/week: 14.0 standard drinks    Types: 14 Glasses of wine per week  . Drug use: No  . Sexual activity: Yes    Partners: Male  Lifestyle  . Physical activity    Days per week: Not on file    Minutes per session: Not on file  . Stress: Not on file  Relationships  . Social Herbalist on phone: Not on file    Gets together: Not on file    Attends religious service: Not on file    Active member of club or organization: Not on file    Attends meetings of clubs or organizations: Not on file    Relationship status: Not on file  . Intimate partner violence    Fear of current or ex partner: Not on file    Emotionally abused: Not on file    Physically abused: Not on file    Forced sexual activity: Not on file  Other Topics Concern  . Not  on file  Social History Narrative   Regular exercise- yes     I appreciate the opportunity to take part in Duane's care. Please do not hesitate to contact me with questions.  Sincerely,   R. Edgar Frisk, MD

## 2019-03-28 NOTE — Assessment & Plan Note (Signed)
Well-controlled.  We will stepdown therapy at this time. A prescription has been provided for Symbicort (budesonide/formoterol) 80/4.5 g, 2 inhalations via spacer device twice a day.  If lower respiratory symptoms progress in frequency and/or severity, the patient is to resume Symbicort 160. During respiratory tract infections and asthma flares, switch to Symbicort 160-4.5 mcg, 2 inhalations via spacer device twice daily until symptoms have returned to baseline.  Continue mepolizomab injections as prescribed and albuterol HFA, 1 to 2 inhalations every 4-6 hours if needed.  Subjective and objective measures of pulmonary function will be followed and the treatment plan will be adjusted accordingly.

## 2019-03-28 NOTE — Patient Instructions (Addendum)
Moderate persistent asthma Well-controlled.  We will stepdown therapy at this time. A prescription has been provided for Symbicort (budesonide/formoterol) 80/4.5 g, 2 inhalations via spacer device twice a day.  If lower respiratory symptoms progress in frequency and/or severity, the patient is to resume Symbicort 160. During respiratory tract infections and asthma flares, switch to Symbicort 160-4.5 mcg, 2 inhalations via spacer device twice daily until symptoms have returned to baseline.  Continue mepolizomab injections as prescribed and albuterol HFA, 1 to 2 inhalations every 4-6 hours if needed.  Subjective and objective measures of pulmonary function will be followed and the treatment plan will be adjusted accordingly.  Chronic rhinitis  Nasal saline spray or rinse if needed.  If needed, add Xhance nasal spray.  EGPA  Continue mepolizomab injections and treatment plan as outlined above for asthma and chronic rhinitis.  Anosmia  As this problem did not improve with prednisone or Xhance, a referral has been provided to otolaryngology, Dr. Benjamine Mola, for further evaluation.   Return in about 5 months (around 08/28/2019), or if symptoms worsen or fail to improve.

## 2019-03-30 ENCOUNTER — Telehealth: Payer: Self-pay | Admitting: *Deleted

## 2019-03-30 NOTE — Telephone Encounter (Signed)
Received notes from Dr. Gavin Pound, MD, [facility] for Dr. Barnett Hatter review- placed in Dr. Barnett Hatter box.

## 2019-04-05 ENCOUNTER — Telehealth: Payer: Self-pay | Admitting: *Deleted

## 2019-04-05 NOTE — Telephone Encounter (Signed)
Has a referral to Dr. Benjamine Mola ENT been placed? PT sent my chart message today inquiring about status. Please advise.

## 2019-04-09 NOTE — Telephone Encounter (Signed)
Patient has been scheduled for 04/27/2019 @ 3:50. Patient has been informed.

## 2019-04-12 ENCOUNTER — Other Ambulatory Visit: Payer: Self-pay

## 2019-04-12 ENCOUNTER — Ambulatory Visit (INDEPENDENT_AMBULATORY_CARE_PROVIDER_SITE_OTHER): Payer: Commercial Managed Care - PPO

## 2019-04-12 DIAGNOSIS — M301 Polyarteritis with lung involvement [Churg-Strauss]: Secondary | ICD-10-CM | POA: Diagnosis not present

## 2019-04-12 DIAGNOSIS — D7218 Eosinophilia in diseases classified elsewhere: Secondary | ICD-10-CM

## 2019-05-10 ENCOUNTER — Ambulatory Visit: Payer: Self-pay

## 2019-06-20 ENCOUNTER — Encounter: Payer: Self-pay | Admitting: Otolaryngology

## 2019-06-23 IMAGING — MG DIGITAL DIAGNOSTIC UNILATERAL RIGHT MAMMOGRAM WITH TOMO AND CAD
4 series · 4 of 12 positions shown · non-contrast
Comparison: Previous exam(s).

CLINICAL DATA: The patient was called back for a right breast
asymmetry located medially.

EXAM:
DIGITAL DIAGNOSTIC UNILATERAL RIGHT MAMMOGRAM WITH CAD AND TOMO

[R CC synth-2D]
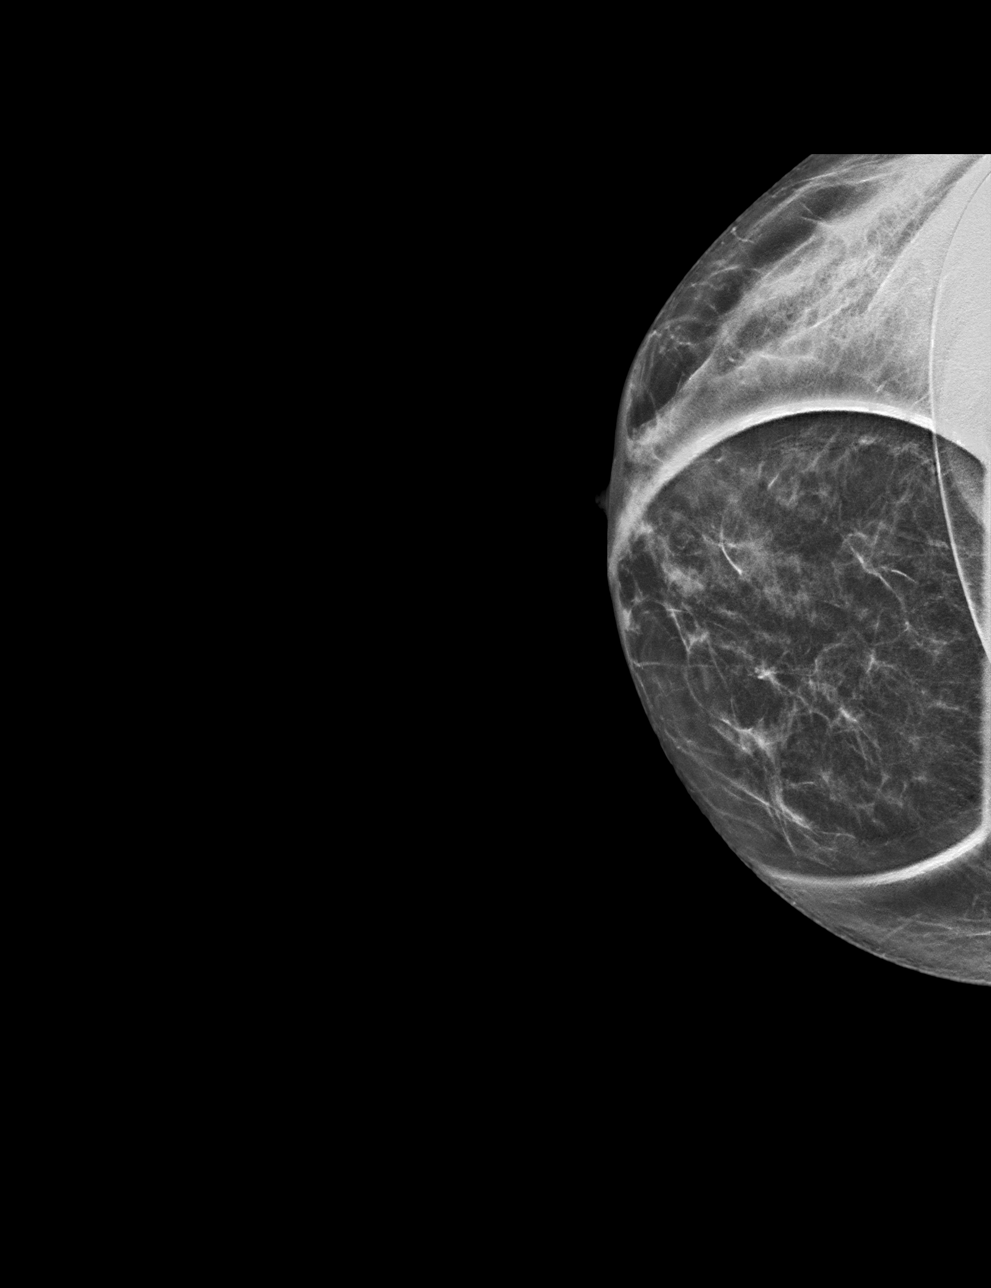

[R ML synth-2D]
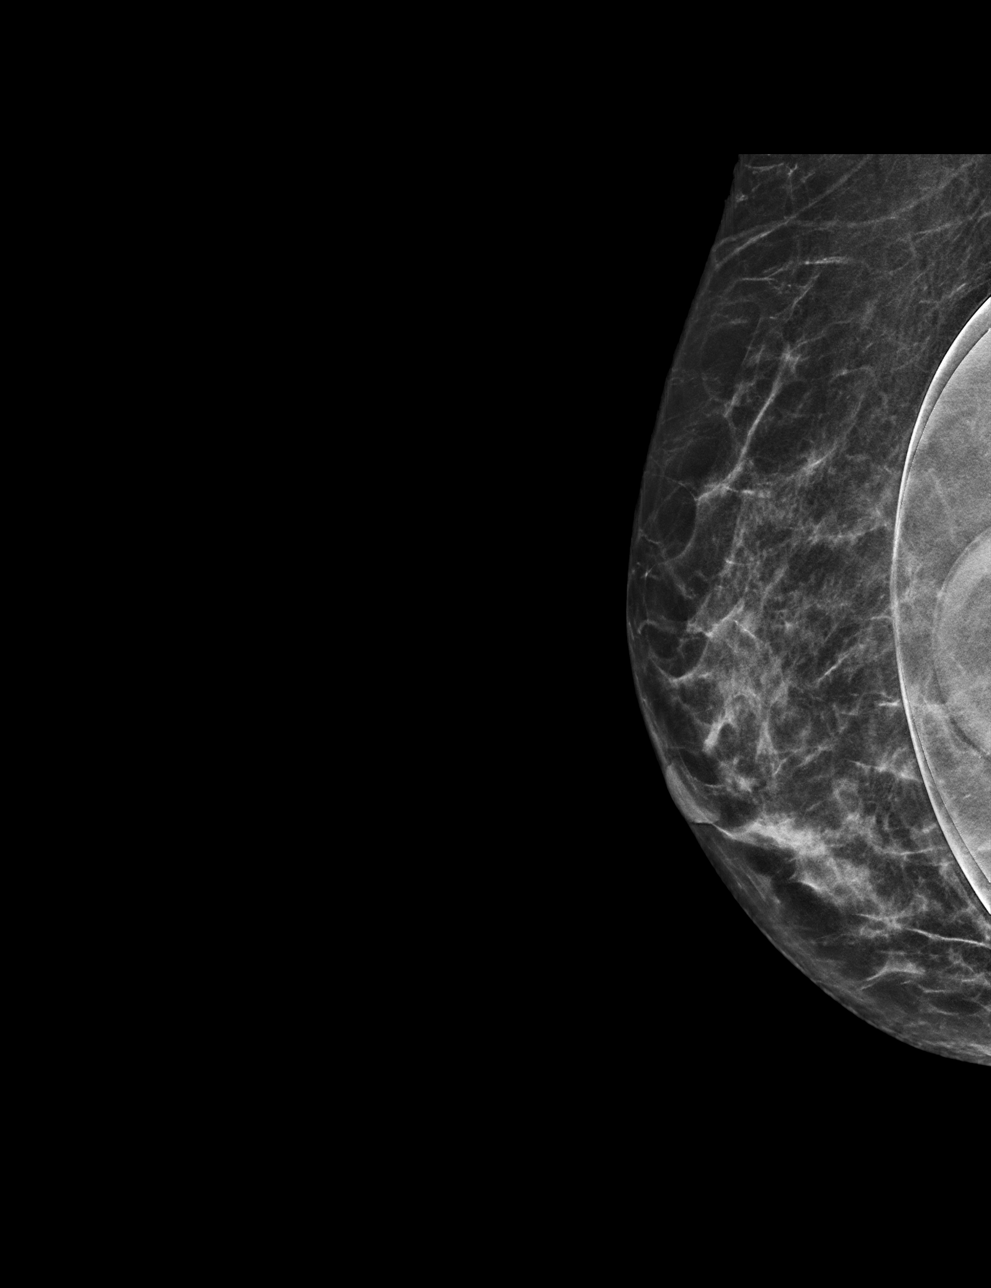

[R CC tomo · tomo slice 25/48.0]
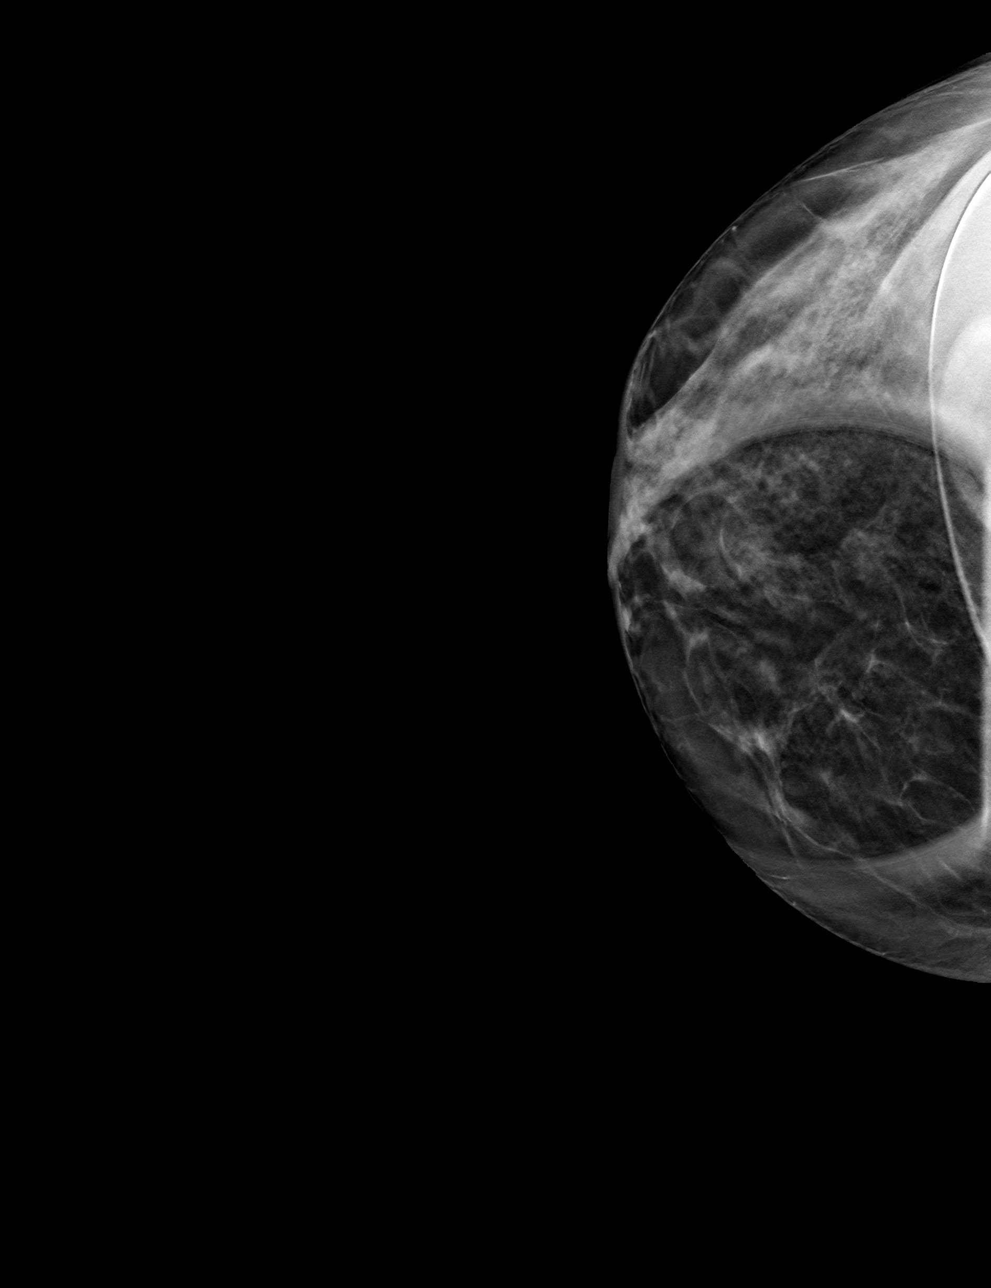

[R ML tomo · tomo slice 26/51.0]
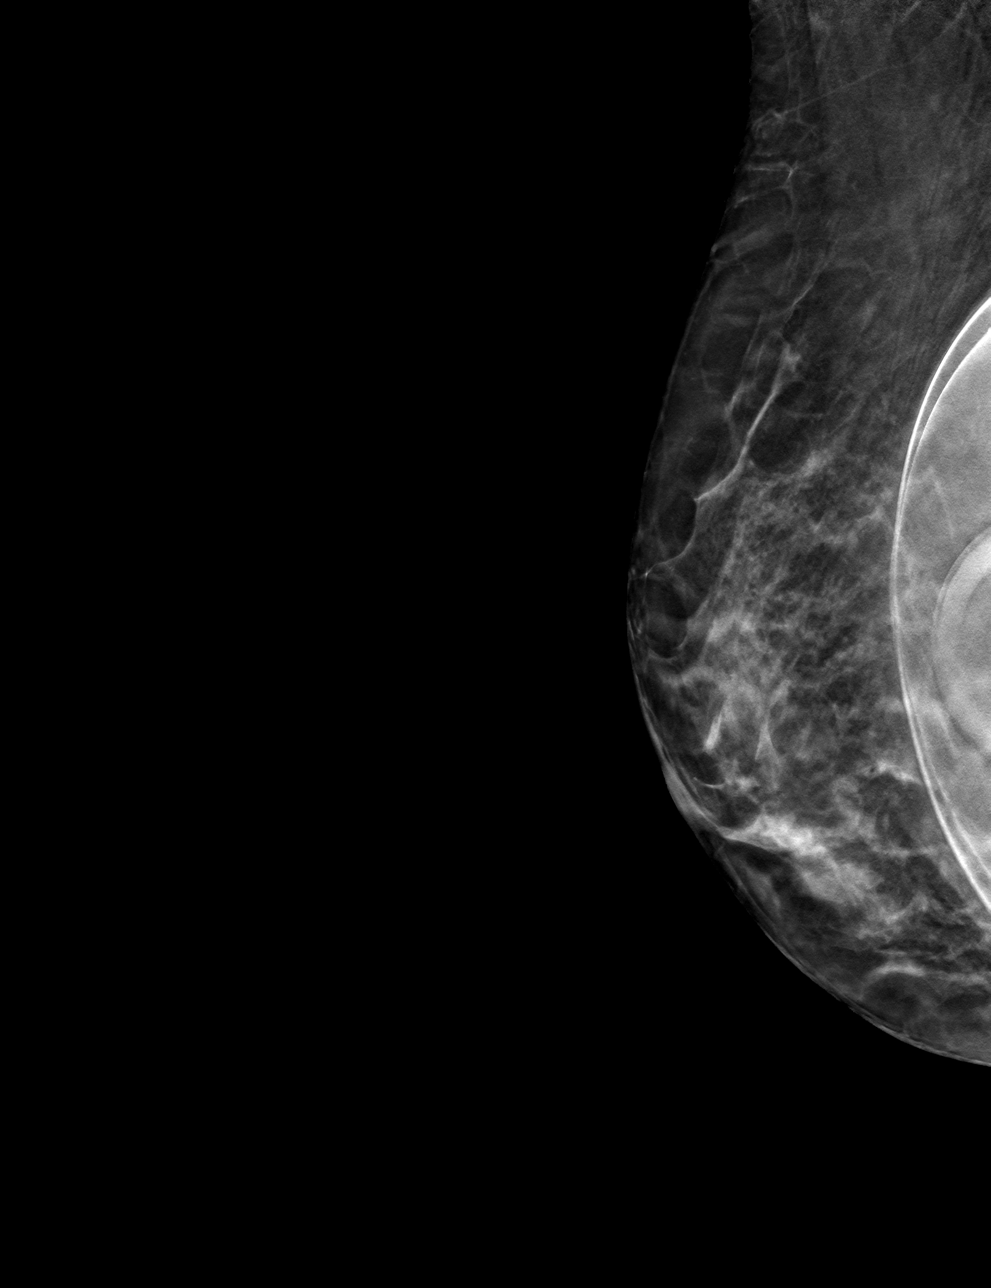

[4 of 12 positions shown; findings below may reference images not displayed]

ACR Breast Density Category b: There are scattered areas of
fibroglandular density.
FINDINGS: The medial right breast asymmetry resolves on additional imaging.

Mammographic images were processed with CAD.
IMPRESSION: No mammographic evidence of malignancy on the right.

RECOMMENDATION:
Annual screening mammography.

I have discussed the findings and recommendations with the patient.
Results were also provided in writing at the conclusion of the
visit. If applicable, a reminder letter will be sent to the patient
regarding the next appointment.

BI-RADS CATEGORY  1: Negative.

## 2019-08-26 ENCOUNTER — Other Ambulatory Visit: Payer: Self-pay | Admitting: Family Medicine

## 2019-08-29 ENCOUNTER — Other Ambulatory Visit: Payer: Self-pay

## 2019-08-29 ENCOUNTER — Ambulatory Visit (INDEPENDENT_AMBULATORY_CARE_PROVIDER_SITE_OTHER): Payer: Commercial Managed Care - PPO | Admitting: Allergy and Immunology

## 2019-08-29 ENCOUNTER — Encounter: Payer: Self-pay | Admitting: Allergy and Immunology

## 2019-08-29 VITALS — BP 90/60 | HR 79 | Temp 98.1°F | Resp 16 | Ht 65.24 in | Wt 145.6 lb

## 2019-08-29 DIAGNOSIS — M301 Polyarteritis with lung involvement [Churg-Strauss]: Secondary | ICD-10-CM

## 2019-08-29 DIAGNOSIS — J4540 Moderate persistent asthma, uncomplicated: Secondary | ICD-10-CM

## 2019-08-29 DIAGNOSIS — D7218 Eosinophilia in diseases classified elsewhere: Secondary | ICD-10-CM

## 2019-08-29 DIAGNOSIS — R42 Dizziness and giddiness: Secondary | ICD-10-CM | POA: Diagnosis not present

## 2019-08-29 DIAGNOSIS — J310 Chronic rhinitis: Secondary | ICD-10-CM

## 2019-08-29 NOTE — Assessment & Plan Note (Signed)
   Continue mepolizomab injections and treatment plan as outlined below for asthma and chronic rhinitis.

## 2019-08-29 NOTE — Patient Instructions (Addendum)
EGPA  Continue mepolizomab injections and treatment plan as outlined below for asthma and chronic rhinitis.  Moderate persistent asthma Well-controlled.    For now, continue Symbicort (budesonide/formoterol) 80/4.5 g, 2 inhalations via spacer device twice a day.   If symptoms remain well controlled, we will stepdown therapy.  Continue mepolizomab injections as prescribed and albuterol HFA, 1 to 2 inhalations every 4-6 hours if needed.  Subjective and objective measures of pulmonary function will be followed and the treatment plan will be adjusted accordingly.  Chronic rhinitis  Continue fluticasone nasal spray as needed.  Nasal saline spray (i.e., Simply Saline) or nasal saline lavage (i.e., NeilMed) is recommended as needed and prior to medicated nasal sprays.  Vertigo  If this problem persists or progresses, follow-up with Dr. Benjamine Mola.   Return in about 6 months (around 02/27/2020), or if symptoms worsen or fail to improve.

## 2019-08-29 NOTE — Progress Notes (Signed)
Follow-up Note  RE: Judy Ramos MRN: [id number] DOB: 11-06-1960 Date of Office Visit: 08/29/2019  Primary care provider: Darreld Mclean, MD Referring provider: Darreld Mclean, MD  History of present illness: [name] is a 58 y.o. female  with EGPA, persistent asthma, and chronic rhinitis presenting today for follow-up.  She receives mepolizomab 300 mg every 28 days without problems or complications.  She reports that she has been off of prednisone since January 2020, with the exception of a sinus infection in August.  She requires that her asthma has been well controlled and that in the interval since her previous visit she has not required asthma rescue medication, experienced nocturnal awakenings due to lower respiratory symptoms, nor have activities of daily living been limited.  She is currently taking Symbicort 80/4.5 g, 2 inhalations via spacer device twice daily.  She just received a 95-month supply of Symbicort the other day.   She reports that her sense of smell returned in November 2020.  She reports that the day after returning from a trip to the mountains she began experiencing vertigo.  The vertigo has improved to some degree, however she still experiences occasional mild vertigo.  She states that if she is lying flat on her bed and turns her head to the left "it feels like things are moving."  Assessment and plan: EGPA  Continue mepolizomab injections and treatment plan as outlined below for asthma and chronic rhinitis.  Moderate persistent asthma Well-controlled.    For now, continue Symbicort (budesonide/formoterol) 80/4.5 g, 2 inhalations via spacer device twice a day.   If symptoms remain well controlled, we will stepdown therapy.  Continue mepolizomab injections as prescribed and albuterol HFA, 1 to 2 inhalations every 4-6 hours if needed.  Subjective and objective measures of pulmonary function will be followed and the treatment plan will be adjusted  accordingly.  Chronic rhinitis  Continue fluticasone nasal spray as needed.  Nasal saline spray (i.e., Simply Saline) or nasal saline lavage (i.e., NeilMed) is recommended as needed and prior to medicated nasal sprays.  Vertigo  If this problem persists or progresses, follow-up with Dr. Benjamine Mola.   Diagnostics: Spirometry:  Normal with an FEV1 of 104% predicted. This study was performed while the patient was asymptomatic.  Please see scanned spirometry results for details.    Physical examination: Blood pressure 90/60, pulse 79, temperature 98.1 F (36.7 C), temperature source Oral, resp. rate 16, height 5' 5.24" (1.657 m), weight 145 lb 9.6 oz (66 kg), last menstrual period 08/23/2016, SpO2 100 %.  General: Alert, interactive, in no acute distress. HEENT: TMs pearly gray, turbinates mildly edematous without discharge, post-pharynx unremarkable. Neck: Supple without lymphadenopathy. Lungs: Clear to auscultation without wheezing, rhonchi or rales. CV: Normal S1, S2 without murmurs. Skin: Warm and dry, without lesions or rashes.  The following portions of the patient's history were reviewed and updated as appropriate: allergies, current medications, past family history, past medical history, past social history, past surgical history and problem list.  Current Outpatient Medications  Medication Sig Dispense Refill  . budesonide-formoterol (SYMBICORT) 80-4.5 MCG/ACT inhaler Inhale 2 puffs into the lungs 2 (two) times daily. 1 Inhaler 5  . Collagenase POWD 1 Scoop by Does not apply route daily.    . diclofenac Sodium (VOLTAREN) 1 % GEL as needed.    Marland Kitchen EPINEPHrine 0.3 mg/0.3 mL IJ SOAJ injection epinephrine 0.3 mg/0.3 mL injection, auto-injector    . levothyroxine (SYNTHROID) 50 MCG tablet TAKE 1 TABLET BY MOUTH  DAILY BEFORE BREAKFAST 90 tablet 1  . Mepolizumab (NUCALA) 100 MG/ML SOAJ Inject 300 mg into the skin every 28 (twenty-eight) days.    . pravastatin (PRAVACHOL) 40 MG tablet  Take 1 tablet (40 mg total) by mouth daily. 90 tablet 3  . VITAMIN D, ERGOCALCIFEROL, PO Take 1 tablet by mouth daily.    . Fluticasone Propionate (XHANCE) 93 MCG/ACT EXHU Place 2 sprays into the nose 2 (two) times daily. 32 mL 12  . permethrin (ELIMITE) 5 % cream Apply thin layer from neck down excluding perineal areas at night. Wash off first thing in the morning. May repeat once in a week. 60 g 0  . Prasterone (INTRAROSA) 6.5 MG INST Intrarosa 6.5 mg vaginal insert  INSERT 1 VAG INSERT DAILY     Current Facility-Administered Medications  Medication Dose Route Frequency Provider Last Rate Last Admin  . Mepolizumab SOLR 300 mg  300 mg Subcutaneous Q28 days Merel Santoli, [place] Muta, MD   300 mg at 04/12/19 1437    Allergies  Allergen Reactions  . Amoxil [Amoxicillin]   . Imuran [Azathioprine] Rash    fever  . Penicillins Rash    Review of systems: Review of systems negative except as noted in HPI / PMHx.   Past Medical History:  Diagnosis Date  . Gallstone   . Hypothyroidism   . Thyroid nodule     Family History  Problem Relation Age of Onset  . Hypertension Mother   . Arthritis Mother   . Hypertension Father   . Arthritis Father   . Neuropathy Other   . Lymphoma Paternal Uncle   . Cancer Paternal Grandfather   . Stroke Paternal Grandmother     Social History   Socioeconomic History  . Marital status: Married    Spouse name: Not on file  . Number of children: Not on file  . Years of education: Not on file  . Highest education level: Not on file  Occupational History    Employer: SELF EMPLOYED    Comment: p/t with husband  Tobacco Use  . Smoking status: Never Smoker  . Smokeless tobacco: Never Used  Substance and Sexual Activity  . Alcohol use: Yes    Alcohol/week: 14.0 standard drinks    Types: 14 Glasses of wine per week  . Drug use: No  . Sexual activity: Yes    Partners: Male  Other Topics Concern  . Not on file  Social History Narrative   Regular  exercise- yes    Social Determinants of Health   Financial Resource Strain:   . Difficulty of Paying Living Expenses: Not on file  Food Insecurity:   . Worried About Charity fundraiser in the Last Year: Not on file  . Ran Out of Food in the Last Year: Not on file  Transportation Needs:   . Lack of Transportation (Medical): Not on file  . Lack of Transportation (Non-Medical): Not on file  Physical Activity:   . Days of Exercise per Week: Not on file  . Minutes of Exercise per Session: Not on file  Stress:   . Feeling of Stress : Not on file  Social Connections:   . Frequency of Communication with Friends and Family: Not on file  . Frequency of Social Gatherings with Friends and Family: Not on file  . Attends Religious Services: Not on file  . Active Member of Clubs or Organizations: Not on file  . Attends Archivist Meetings: Not on file  .  Marital Status: Not on file  Intimate Partner Violence:   . Fear of Current or Ex-Partner: Not on file  . Emotionally Abused: Not on file  . Physically Abused: Not on file  . Sexually Abused: Not on file    I appreciate the opportunity to take part in Sarayah's care. Please do not hesitate to contact me with questions.  Sincerely,   R. Edgar Frisk, MD

## 2019-08-29 NOTE — Assessment & Plan Note (Signed)
   If this problem persists or progresses, follow-up with Dr. Benjamine Mola.

## 2019-08-29 NOTE — Assessment & Plan Note (Signed)
   Continue fluticasone nasal spray as needed.  Nasal saline spray (i.e., Simply Saline) or nasal saline lavage (i.e., NeilMed) is recommended as needed and prior to medicated nasal sprays.

## 2019-08-29 NOTE — Assessment & Plan Note (Signed)
Well-controlled.    For now, continue Symbicort (budesonide/formoterol) 80/4.5 g, 2 inhalations via spacer device twice a day.   If symptoms remain well controlled, we will stepdown therapy.  Continue mepolizomab injections as prescribed and albuterol HFA, 1 to 2 inhalations every 4-6 hours if needed.  Subjective and objective measures of pulmonary function will be followed and the treatment plan will be adjusted accordingly.

## 2019-10-16 ENCOUNTER — Other Ambulatory Visit: Payer: Self-pay

## 2019-10-16 ENCOUNTER — Other Ambulatory Visit: Payer: Self-pay | Admitting: Allergy and Immunology

## 2019-10-16 ENCOUNTER — Telehealth: Payer: Self-pay | Admitting: Allergy and Immunology

## 2019-10-16 MED ORDER — FLOVENT HFA 110 MCG/ACT IN AERO: 2.0000 | INHALATION_SPRAY | Freq: Two times a day (BID) | RESPIRATORY_TRACT | 1 refills | Status: DC

## 2019-10-16 NOTE — Telephone Encounter (Signed)
Spoke with pt she wants [facility], I sent in rx and informed her of the change from symbicort to flovent she stated understanding. I asked her to give [place] a call if the flovent is not helping her stay stable

## 2019-10-16 NOTE — Telephone Encounter (Signed)
Lm for pt to call us back, need to informed her of the step down and also need to verify her pharmacy to send her flovent too

## 2019-10-16 NOTE — Telephone Encounter (Signed)
Looked at last note from dec 2020, I am not sure exactly what pt is talking about for step down therapy. Please advise.

## 2019-10-16 NOTE — Telephone Encounter (Signed)
I told her on her last visit that if her asthma remained well controlled we would step down from Symbicort 80 to Flovent 110.  Please put in a prescription for Flovent 110 g, 2 inhalations via spacer device twice daily.  Thanks.

## 2019-10-16 NOTE — Telephone Encounter (Signed)
PT called to follow up with Dr Verlin Fester on last ov. She continued using symbicort and is doing much better now. She was told new meds would be ordered for "stepdown therapy"

## 2019-10-19 ENCOUNTER — Other Ambulatory Visit: Payer: Self-pay

## 2019-11-22 ENCOUNTER — Telehealth: Payer: Self-pay

## 2019-11-22 NOTE — Telephone Encounter (Signed)
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 She states that she looked at the [facility] website for guidance and it recommended for her to talk with her physician. Please advise.

## 2019-11-22 NOTE — Telephone Encounter (Signed)
Patient called & she would like to know if Dr Verlin Fester could advise her on which injection she should take due to her condition. Patient didn't go into detail regarding her condition.  Could someone give her a call please.  Thanks

## 2019-11-27 NOTE — Telephone Encounter (Signed)
I spoke with the patient on the telephone and answered her questions. She will call if she has more questions.

## 2019-11-30 ENCOUNTER — Other Ambulatory Visit: Payer: Self-pay | Admitting: Family Medicine

## 2019-11-30 DIAGNOSIS — E785 Hyperlipidemia, unspecified: Secondary | ICD-10-CM

## 2020-01-16 NOTE — Patient Instructions (Addendum)
It was great to see you again today, I will be in touch with your labs as soon as possible and will fax in your form We can adjust your thyroid if needed- if ok I will refill tdap given today- this is your tetanus and whooping cough booster Keep up the good work with exercise!    Please ask Dr Verlin Fester about  -shingles vaccine -pneumonia vaccine  Let's plan for a bone density screening in one year    Health Maintenance, Female Adopting a healthy lifestyle and getting preventive care are important in promoting health and wellness. Ask your health care provider about:  The right schedule for you to have regular tests and exams.  Things you can do on your own to prevent diseases and keep yourself healthy. What should I know about diet, weight, and exercise? Eat a healthy diet   Eat a diet that includes plenty of vegetables, fruits, low-fat dairy products, and lean protein.  Do not eat a lot of foods that are high in solid fats, added sugars, or sodium. Maintain a healthy weight Body mass index (BMI) is used to identify weight problems. It estimates body fat based on height and weight. Your health care provider can help determine your BMI and help you achieve or maintain a healthy weight. Get regular exercise Get regular exercise. This is one of the most important things you can do for your health. Most adults should:  Exercise for at least 150 minutes each week. The exercise should increase your heart rate and make you sweat (moderate-intensity exercise).  Do strengthening exercises at least twice a week. This is in addition to the moderate-intensity exercise.  Spend less time sitting. Even light physical activity can be beneficial. Watch cholesterol and blood lipids Have your blood tested for lipids and cholesterol at 57 years of age, then have this test every 5 years. Have your cholesterol levels checked more often if:  Your lipid or cholesterol levels are high.  You are older  than 57 years of age.  You are at high risk for heart disease. What should I know about cancer screening? Depending on your health history and family history, you may need to have cancer screening at various ages. This may include screening for:  Breast cancer.  Cervical cancer.  Colorectal cancer.  Skin cancer.  Lung cancer. What should I know about heart disease, diabetes, and high blood pressure? Blood pressure and heart disease  High blood pressure causes heart disease and increases the risk of stroke. This is more likely to develop in people who have high blood pressure readings, are of African descent, or are overweight.  Have your blood pressure checked: ? Every 3-5 years if you are 84-4 years of age. ? Every year if you are 9 years old or older. Diabetes Have regular diabetes screenings. This checks your fasting blood sugar level. Have the screening done:  Once every three years after age 62 if you are at a normal weight and have a low risk for diabetes.  More often and at a younger age if you are overweight or have a high risk for diabetes. What should I know about preventing infection? Hepatitis B If you have a higher risk for hepatitis B, you should be screened for this virus. Talk with your health care provider to find out if you are at risk for hepatitis B infection. Hepatitis C Testing is recommended for:  Everyone born from [birth year] through 1963.  Anyone with known risk factors  for hepatitis C. Sexually transmitted infections (STIs)  Get screened for STIs, including gonorrhea and chlamydia, if: ? You are sexually active and are younger than 57 years of age. ? You are older than 57 years of age and your health care provider tells you that you are at risk for this type of infection. ? Your sexual activity has changed since you were last screened, and you are at increased risk for chlamydia or gonorrhea. Ask your health care provider if you are at risk.  Ask  your health care provider about whether you are at high risk for HIV. Your health care provider may recommend a prescription medicine to help prevent HIV infection. If you choose to take medicine to prevent HIV, you should first get tested for HIV. You should then be tested every 3 months for as long as you are taking the medicine. Pregnancy  If you are about to stop having your period (premenopausal) and you may become pregnant, seek counseling before you get pregnant.  Take 400 to 800 micrograms (mcg) of folic acid every day if you become pregnant.  Ask for birth control (contraception) if you want to prevent pregnancy. Osteoporosis and menopause Osteoporosis is a disease in which the bones lose minerals and strength with aging. This can result in bone fractures. If you are 46 years old or older, or if you are at risk for osteoporosis and fractures, ask your health care provider if you should:  Be screened for bone loss.  Take a calcium or vitamin D supplement to lower your risk of fractures.  Be given hormone replacement therapy (HRT) to treat symptoms of menopause. Follow these instructions at home: Lifestyle  Do not use any products that contain nicotine or tobacco, such as cigarettes, e-cigarettes, and chewing tobacco. If you need help quitting, ask your health care provider.  Do not use street drugs.  Do not share needles.  Ask your health care provider for help if you need support or information about quitting drugs. Alcohol use  Do not drink alcohol if: ? Your health care provider tells you not to drink. ? You are pregnant, may be pregnant, or are planning to become pregnant.  If you drink alcohol: ? Limit how much you use to 0-1 drink a day. ? Limit intake if you are breastfeeding.  Be aware of how much alcohol is in your drink. In the [place] one drink equals one 12 oz bottle of beer (355 mL), one 5 oz glass of wine (148 mL), or one 1 oz glass of hard liquor (44  mL). General instructions  Schedule regular health, dental, and eye exams.  Stay current with your vaccines.  Tell your health care provider if: ? You often feel depressed. ? You have ever been abused or do not feel safe at home. Summary  Adopting a healthy lifestyle and getting preventive care are important in promoting health and wellness.  Follow your health care provider's instructions about healthy diet, exercising, and getting tested or screened for diseases.  Follow your health care provider's instructions on monitoring your cholesterol and blood pressure. This information is not intended to replace advice given to you by your health care provider. Make sure you discuss any questions you have with your health care provider. Document Revised: 08/09/2018 Document Reviewed: 08/09/2018 Elsevier Patient Education  2020 [employer].

## 2020-01-16 NOTE — Progress Notes (Addendum)
[place] [facility] [address], [place], [place] [address] [id number] [phone]  Date:  01/17/2020   Name:  Judy Ramos   DOB:  [birth year]   MRN:  [id number]  PCP:  Darreld Mclean, MD    Chief Complaint: Annual Exam   History of Present Illness:  Judy Ramos is a 59 y.o. very pleasant female patient who presents with the following:  Patient with history of moderate persistent asthma, reflux, hypothyroidism, dyslipidemia, Churg Strass syndrome, prednisone related osteopenia here today for routine physical Her asthma is thought related to her ChurgAltamese Cabal syndrome.  This is managed by [facility] rheumatology, she also sees asthma/allergy with Dr. Verlin Fester  Review Dr. Mariane Masters most recent note from December She is using Mepolizomab injection every 28 days: Assessment and plan: EGPA  Continue mepolizomab injections and treatment plan as outlined below for asthma and chronic rhinitis.  Moderate persistent asthma Well-controlled.    For now, continue Symbicort (budesonide/formoterol) 80/4.5 g, 2 inhalations via spacer device twice a day.   If symptoms remain well controlled, we will stepdown therapy.  Continue mepolizomab injections as prescribed and albuterol HFA, 1 to 2 inhalations every 4-6 hours if needed.  Subjective and objective measures of pulmonary function will be followed and the treatment plan will be adjusted accordingly.  Chronic rhinitis  Continue fluticasone nasal spray as needed.  Nasal saline spray (i.e., Simply Saline) or nasal saline lavage (i.e., NeilMed) is recommended as needed and prior to medicated nasal sprays  I also reviewed rheumatology note from July 2020-at that time she was noted to be doing extremely well, she was able to stop her prednisone and was also off Fosamax as bone density had improved.  Dr. Lenna Gilford asked for as needed follow-up Last seen by myself in March 2020-at that time she  was doing well 2 adult sons, married to [address]. She enjoys walking and tennis for exercise She has not been playing as much tennis, she is walking a lot of exercise. No CP or SOB with exercise.  Both her sons are living in [place], working  Tdap- will update  Mammogram June 2020- she has scheduled next month, Dr Helane Rima  Pap is up-to-date- [facility] up-to-date COVID-19 vaccine- she has been cautioned against doing this per Dr Verlin Fester for now  Routine labs are due Bone density; we think about a year ago Patient Active Problem List   Diagnosis Date Noted  . Vertigo 08/29/2019  . 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 Osteoporosis 09/02/2016  . Eosinophilia 04/26/2016  . Chronic rhinitis 04/26/2016  . Family history of colonic polyps 02/02/2016  . 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 Asthmatic bronchitis 01/05/2016  . Hyperlipidemia 01/05/2016  . Moderate persistent asthma 11/13/2015  . Perennial allergic rhinitis with a nonallergic component 11/10/2015  . Persistent cough 11/10/2015  . Esophageal reflux 11/10/2015  . Chronic maxillary sinusitis 11/10/2015  . History of asthma 11/10/2015  . Rash 12/23/2010  . IRREGULAR MENSES 01/20/2010  . VARICOSE VEINS LOWER EXTREMITIES W/INFLAMMATION 10/16/2009  . CERVICAL POLYP 10/16/2009  . INSOMNIA 09/05/2008  . Hypothyroid 10/23/2007  . THYROID NODULE 02/08/2007    Past Medical History:  Diagnosis Date  . Gallstone   . Hypothyroidism   . Thyroid nodule     Past Surgical History:  Procedure Laterality Date  . AUGMENTATION MAMMAPLASTY Bilateral   . BREAST ENHANCEMENT SURGERY  2001  . CESAREAN SECTION     x 2  . CHOLECYSTECTOMY  05/1999  .  DILATION AND CURETTAGE OF UTERUS  2011  . LASIK    . uterine ablation  2011  . VEIN LIGATION AND STRIPPING    . VIDEO BRONCHOSCOPY Bilateral 12/10/2015   Procedure: VIDEO BRONCHOSCOPY WITHOUT FLUORO;  Surgeon: Tanda Rockers, MD;  Location: [facility];  Service: Endoscopy;  Laterality: Bilateral;    Social History    Tobacco Use  . Smoking status: Never Smoker  . Smokeless tobacco: Never Used  Substance Use Topics  . Alcohol use: Yes    Alcohol/week: 14.0 standard drinks    Types: 14 Glasses of wine per week  . Drug use: No    Family History  Problem Relation Age of Onset  . Hypertension Mother   . Arthritis Mother   . Hypertension Father   . Arthritis Father   . Neuropathy Other   . Lymphoma Paternal Uncle   . Cancer Paternal Grandfather   . Stroke Paternal Grandmother     Allergies  Allergen Reactions  . Amoxil [Amoxicillin]   . Imuran [Azathioprine] Rash    fever  . Penicillins Rash    Medication list has been reviewed and updated.  Current Outpatient Medications on File Prior to Visit  Medication Sig Dispense Refill  . budesonide-formoterol (SYMBICORT) 80-4.5 MCG/ACT inhaler Inhale 2 puffs into the lungs 2 (two) times daily. 1 Inhaler 5  . Collagenase POWD 1 Scoop by Does not apply route daily.    . diclofenac Sodium (VOLTAREN) 1 % GEL as needed.    Marland Kitchen EPINEPHrine 0.3 mg/0.3 mL IJ SOAJ injection epinephrine 0.3 mg/0.3 mL injection, auto-injector    . fluticasone (FLOVENT HFA) 110 MCG/ACT inhaler Inhale 2 puffs into the lungs 2 (two) times daily. 3 Inhaler 1  . levothyroxine (SYNTHROID) 50 MCG tablet TAKE 1 TABLET BY MOUTH  DAILY BEFORE BREAKFAST 90 tablet 1  . Mepolizumab (NUCALA) 100 MG/ML SOAJ Inject 300 mg into the skin every 28 (twenty-eight) days. 3 mL 11  . pravastatin (PRAVACHOL) 40 MG tablet Take 1qd (Plz sched appt for future fills) 90 tablet 0  . VITAMIN D, ERGOCALCIFEROL, PO Take 1 tablet by mouth daily.     Current Facility-Administered Medications on File Prior to Visit  Medication Dose Route Frequency Provider Last Rate Last Admin  . Mepolizumab SOLR 300 mg  300 mg Subcutaneous Q28 days Bobbitt, Sedalia Muta, MD   300 mg at 04/12/19 1437    Review of Systems:  As per HPI- otherwise negative.   Physical Examination: Vitals:   01/17/20 0812  BP: 135/73   Pulse: 74  Resp: 16  Temp: (!) 97 F (36.1 C)   Vitals:   01/17/20 0812  Weight: 135 lb (61.2 kg)  Height: 5\' 5"  (1.651 m)   Body mass index is 22.47 kg/m. Ideal Body Weight: Weight in (lb) to have BMI = 25: 149.9  GEN: no acute distress.  Normal weight, looks well  HEENT: Atraumatic, Normocephalic.   Bilateral TM wnl, oropharynx normal.  PEERL,EOMI.   Ears and Nose: No external deformity. CV: RRR, No M/G/R. No JVD. No thrill. No extra heart sounds. PULM: CTA B, no wheezes, crackles, rhonchi. No retractions. No resp. distress. No accessory muscle use. ABD: S, NT, ND, +BS. No rebound. No HSM. EXTR: No c/c/e PSYCH: Normally interactive. Conversant.    Assessment and Plan: Physical exam  Screening for diabetes mellitus - Plan: Comprehensive metabolic panel, Hemoglobin A1c  Hyperlipidemia, unspecified hyperlipidemia type - Plan: Lipid panel, pravastatin (PRAVACHOL) 40 MG tablet  Hypothyroidism, unspecified type -  Plan: TSH  Screening for deficiency anemia - Plan: CBC  Immunization due - Plan: Tdap vaccine greater than or equal to 7yo IM  Doing well today tdap given- we expect she will have grandchildren at some Ramos and will need Will refill thyroid med once her TSH comes in Plan for dexa in one year  Will plan further follow- up pending labs.  This visit occurred during the SARS-CoV-2 public health emergency.  Safety protocols were in place, including screening questions prior to the visit, additional usage of staff PPE, and extensive cleaning of exam room while observing appropriate contact time as indicated for disinfecting solutions.    Signed Lamar Blinks, MD  Addendum 5/21, received her labs as below.  Message to patient  Results for orders placed or performed in visit on 01/17/20  CBC  Result Value Ref Range   WBC 4.6 4.0 - 10.5 K/uL   RBC 4.02 3.87 - 5.11 Mil/uL   Platelets 249.0 150.0 - 400.0 K/uL   Hemoglobin 13.9 12.0 - 15.0 g/dL   HCT 40.2 36.0  - 46.0 %   MCV 100.1 (H) 78.0 - 100.0 fl   MCHC 34.5 30.0 - 36.0 g/dL   RDW 13.4 11.5 - 15.5 %  Comprehensive metabolic panel  Result Value Ref Range   Sodium 132 (L) 135 - 145 mEq/L   Potassium 4.3 3.5 - 5.1 mEq/L   Chloride 97 96 - 112 mEq/L   CO2 27 19 - 32 mEq/L   Glucose, Bld 87 70 - 99 mg/dL   BUN 15 6 - 23 mg/dL   Creatinine, Ser 0.80 0.40 - 1.20 mg/dL   Total Bilirubin 0.8 0.2 - 1.2 mg/dL   Alkaline Phosphatase 97 39 - 117 U/L   AST 20 0 - 37 U/L   ALT 14 0 - 35 U/L   Total Protein 7.6 6.0 - 8.3 g/dL   Albumin 4.9 3.5 - 5.2 g/dL   GFR 74.03 >60.00 mL/min   Calcium 9.9 8.4 - 10.5 mg/dL  Hemoglobin A1c  Result Value Ref Range   Hgb A1c MFr Bld 5.5 4.6 - 6.5 %  Lipid panel  Result Value Ref Range   Cholesterol 206 (H) 0 - 200 mg/dL   Triglycerides 61.0 0.0 - 149.0 mg/dL   HDL 81.60 >39.00 mg/dL   VLDL 12.2 0.0 - 40.0 mg/dL   LDL Cholesterol 112 (H) 0 - 99 mg/dL   Total CHOL/HDL Ratio 3    NonHDL 124.34   TSH  Result Value Ref Range   TSH 1.01 0.35 - 4.50 uIU/mL

## 2020-01-17 ENCOUNTER — Ambulatory Visit (INDEPENDENT_AMBULATORY_CARE_PROVIDER_SITE_OTHER): Payer: Commercial Managed Care - PPO | Admitting: Family Medicine

## 2020-01-17 ENCOUNTER — Other Ambulatory Visit: Payer: Self-pay

## 2020-01-17 ENCOUNTER — Encounter: Payer: Self-pay | Admitting: Family Medicine

## 2020-01-17 VITALS — BP 135/73 | HR 74 | Temp 97.0°F | Resp 16 | Ht 65.0 in | Wt 135.0 lb

## 2020-01-17 DIAGNOSIS — E785 Hyperlipidemia, unspecified: Secondary | ICD-10-CM

## 2020-01-17 DIAGNOSIS — Z131 Encounter for screening for diabetes mellitus: Secondary | ICD-10-CM

## 2020-01-17 DIAGNOSIS — Z0000 Encounter for general adult medical examination without abnormal findings: Secondary | ICD-10-CM | POA: Diagnosis not present

## 2020-01-17 DIAGNOSIS — Z130 Encounter for screening for diseases of the blood and blood-forming organs and certain disorders involving the immune mechanism: Secondary | ICD-10-CM

## 2020-01-17 DIAGNOSIS — E039 Hypothyroidism, unspecified: Secondary | ICD-10-CM | POA: Diagnosis not present

## 2020-01-17 DIAGNOSIS — Z23 Encounter for immunization: Secondary | ICD-10-CM | POA: Diagnosis not present

## 2020-01-17 LAB — COMPREHENSIVE METABOLIC PANEL
ALT: 14 U/L (ref 0–35)
AST: 20 U/L (ref 0–37)
Albumin: 4.9 g/dL (ref 3.5–5.2)
Alkaline Phosphatase: 97 U/L (ref 39–117)
BUN: 15 mg/dL (ref 6–23)
CO2: 27 mEq/L (ref 19–32)
Calcium: 9.9 mg/dL (ref 8.4–10.5)
Chloride: 97 mEq/L (ref 96–112)
Creatinine, Ser: 0.8 mg/dL (ref 0.40–1.20)
GFR: 74.03 mL/min (ref >60.00)
Glucose, Bld: 87 mg/dL (ref 70–99)
Potassium: 4.3 mEq/L (ref 3.5–5.1)
Sodium: 132 mEq/L — ABNORMAL LOW (ref 135–145)
Total Bilirubin: 0.8 mg/dL (ref 0.2–1.2)
Total Protein: 7.6 g/dL (ref 6.0–8.3)

## 2020-01-17 LAB — HEMOGLOBIN A1C: Hgb A1c MFr Bld: 5.5 % (ref 4.6–6.5)

## 2020-01-17 LAB — CBC
HCT: 40.2 % (ref 36.0–46.0)
Hemoglobin: 13.9 g/dL (ref 12.0–15.0)
MCHC: 34.5 g/dL (ref 30.0–36.0)
MCV: 100.1 fl — ABNORMAL HIGH (ref 78.0–100.0)
Platelets: 249 10*3/uL (ref 150.0–400.0)
RBC: 4.02 Mil/uL (ref 3.87–5.11)
RDW: 13.4 % (ref 11.5–15.5)
WBC: 4.6 10*3/uL (ref 4.0–10.5)

## 2020-01-17 LAB — LIPID PANEL
Cholesterol: 206 mg/dL — ABNORMAL HIGH (ref 0–200)
HDL: 81.6 mg/dL (ref >39.00)
LDL Cholesterol: 112 mg/dL — ABNORMAL HIGH (ref 0–99)
NonHDL: 124.34
Total CHOL/HDL Ratio: 3
Triglycerides: 61 mg/dL (ref 0.0–149.0)
VLDL: 12.2 mg/dL (ref 0.0–40.0)

## 2020-01-17 LAB — TSH: TSH: 1.01 u[IU]/mL (ref 0.35–4.50)

## 2020-01-17 MED ORDER — PRAVASTATIN SODIUM 40 MG PO TABS: ORAL_TABLET | ORAL | 3 refills | Status: DC

## 2020-01-18 ENCOUNTER — Encounter: Payer: Self-pay | Admitting: Family Medicine

## 2020-01-18 DIAGNOSIS — E871 Hypo-osmolality and hyponatremia: Secondary | ICD-10-CM

## 2020-01-31 ENCOUNTER — Encounter: Payer: Self-pay | Admitting: Family Medicine

## 2020-02-13 ENCOUNTER — Encounter: Payer: Self-pay | Admitting: Family Medicine

## 2020-02-13 ENCOUNTER — Other Ambulatory Visit: Payer: Self-pay

## 2020-02-13 ENCOUNTER — Other Ambulatory Visit (INDEPENDENT_AMBULATORY_CARE_PROVIDER_SITE_OTHER): Payer: Commercial Managed Care - PPO

## 2020-02-13 DIAGNOSIS — E871 Hypo-osmolality and hyponatremia: Secondary | ICD-10-CM

## 2020-02-13 LAB — BASIC METABOLIC PANEL
BUN: 14 mg/dL (ref 6–23)
CO2: 31 mEq/L (ref 19–32)
Calcium: 9.8 mg/dL (ref 8.4–10.5)
Chloride: 100 mEq/L (ref 96–112)
Creatinine, Ser: 0.79 mg/dL (ref 0.40–1.20)
GFR: 75.1 mL/min (ref >60.00)
Glucose, Bld: 106 mg/dL — ABNORMAL HIGH (ref 70–99)
Potassium: 4.2 mEq/L (ref 3.5–5.1)
Sodium: 136 mEq/L (ref 135–145)

## 2020-02-19 ENCOUNTER — Other Ambulatory Visit: Payer: Self-pay | Admitting: Family Medicine

## 2020-02-27 ENCOUNTER — Encounter: Payer: Self-pay | Admitting: Allergy and Immunology

## 2020-02-27 ENCOUNTER — Ambulatory Visit (INDEPENDENT_AMBULATORY_CARE_PROVIDER_SITE_OTHER): Payer: Commercial Managed Care - PPO | Admitting: Allergy and Immunology

## 2020-02-27 ENCOUNTER — Other Ambulatory Visit: Payer: Self-pay

## 2020-02-27 VITALS — BP 122/64 | HR 78 | Temp 97.9°F | Resp 16 | Ht 65.2 in | Wt 137.6 lb

## 2020-02-27 DIAGNOSIS — M301 Polyarteritis with lung involvement [Churg-Strauss]: Secondary | ICD-10-CM | POA: Diagnosis not present

## 2020-02-27 DIAGNOSIS — D7218 Eosinophilia in diseases classified elsewhere: Secondary | ICD-10-CM

## 2020-02-27 DIAGNOSIS — J310 Chronic rhinitis: Secondary | ICD-10-CM

## 2020-02-27 DIAGNOSIS — J0100 Acute maxillary sinusitis, unspecified: Secondary | ICD-10-CM

## 2020-02-27 DIAGNOSIS — J4530 Mild persistent asthma, uncomplicated: Secondary | ICD-10-CM

## 2020-02-27 MED ORDER — FLOVENT HFA 110 MCG/ACT IN AERO: 2.0000 | INHALATION_SPRAY | Freq: Two times a day (BID) | RESPIRATORY_TRACT | 1 refills | Status: DC

## 2020-02-27 MED ORDER — EPINEPHRINE 0.3 MG/0.3ML IJ SOAJ: 0.3000 mg | INTRAMUSCULAR | 1 refills | Status: DC | PRN

## 2020-02-27 NOTE — Assessment & Plan Note (Signed)
   Continue/complete prednisone pack as prescribed by Dr. Deeann Saint office.  Fluticasone nasal spray, 1-2 sprays per nostril daily for now.  Nasal saline spray (i.e., Simply Saline) or nasal saline lavage (i.e., NeilMed) is recommended as needed and prior to medicated nasal sprays.  Add guaifenesin 1200 mg (Mucinex Maximum Strength)  twice daily as needed with adequate hydration as discussed.  If symptoms progress, particularly with fevers and/or chills, an antibiotic will be warranted.

## 2020-02-27 NOTE — Assessment & Plan Note (Addendum)
   Continue mepolizomab injections as prescribed as well as treatment plan outlined above.

## 2020-02-27 NOTE — Patient Instructions (Addendum)
Acute sinusitis  Continue/complete prednisone pack as prescribed by Dr. Deeann Saint office.  Fluticasone nasal spray, 1-2 sprays per nostril daily for now.  Nasal saline spray (i.e., Simply Saline) or nasal saline lavage (i.e., NeilMed) is recommended as needed and prior to medicated nasal sprays.  Add guaifenesin 1200 mg (Mucinex Maximum Strength)  twice daily as needed with adequate hydration as discussed.  If symptoms progress, particularly with fevers and/or chills, an antibiotic will be warranted.  Mild persistent asthma  Restart Flovent 110 g, 1 inhalation via spacer device twice daily.  During respiratory tract infections or asthma flares, increase Flovent 110g to 2 inhalations via spacer device 2 times per day until symptoms have returned to baseline.  Albuterol HFA, 1 to 2 inhalations every 4-6 hours if needed.  Subjective and objective measures of pulmonary function will be followed and the treatment plan will be adjusted accordingly.  EGPA  Continue mepolizomab injections as prescribed as well as treatment plan outlined above.   Return in about 6 months (around 08/28/2020), or if symptoms worsen or fail to improve.

## 2020-02-27 NOTE — Assessment & Plan Note (Addendum)
   Restart Flovent 110 g, 1 inhalation via spacer device twice daily.  During respiratory tract infections or asthma flares, increase Flovent 110g to 2 inhalations via spacer device 2 times per day until symptoms have returned to baseline.  Albuterol HFA, 1 to 2 inhalations every 4-6 hours if needed.  Subjective and objective measures of pulmonary function will be followed and the treatment plan will be adjusted accordingly.

## 2020-02-27 NOTE — Progress Notes (Signed)
Follow-up Note  RE: Judy Ramos MRN: [id number] DOB: 1960-10-02 Date of Office Visit: 02/27/2020  Primary care provider: Darreld Mclean, MD Referring provider: Darreld Mclean, MD  History of present illness: Judy Ramos is a 59 y.o. 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  She was last seen in this clinic in December 2020.  She reports that over the past 2 weeks her sinuses "started filling up."  She has been producing thick mucus which "goes from clear to green and everything in between."  She denies fevers and chills.  She was seen in Dr. Ihor Dow office yesterday and was prescribed prednisone taper from 60 mg to 10 mg over the course of 6 days.  She admits that she had stopped using fluticasone nasal spray for the past couple months and her otolaryngologist has recommended that she restart the steroid nasal spray. She also notes that she had stopped taking Flovent for couple months because she felt like her lower respiratory symptoms were well controlled.  However, she notes that recently she has been experiencing more frequent chest tightness.  The chest tightness is relieved when she uses the Flovent. She is receiving mepolizumab injections without problems or complications with the assistance of her husband at home.  Assessment and plan: Acute sinusitis  Continue/complete prednisone pack as prescribed by Dr. Deeann Saint office.  Fluticasone nasal spray, 1-2 sprays per nostril daily for now.  Nasal saline spray (i.e., Simply Saline) or nasal saline lavage (i.e., NeilMed) is recommended as needed and prior to medicated nasal sprays.  Add guaifenesin 1200 mg (Mucinex Maximum Strength)  twice daily as needed with adequate hydration as discussed.  If symptoms progress, particularly with fevers and/or chills, an antibiotic will be warranted.  Mild persistent asthma  Restart Flovent 110 g, 1  inhalation via spacer device twice daily.  During respiratory tract infections or asthma flares, increase Flovent 110g to 2 inhalations via spacer device 2 times per day until symptoms have returned to baseline.  Albuterol HFA, 1 to 2 inhalations every 4-6 hours if needed.  Subjective and objective measures of pulmonary function will be followed and the treatment plan will be adjusted accordingly.  EGPA  Continue mepolizomab injections as prescribed as well as treatment plan outlined above.   Meds ordered this encounter  Medications  . EPINEPHrine (AUVI-Q) 0.3 mg/0.3 mL IJ SOAJ injection    Sig: Inject 0.3 mLs (0.3 mg total) into the muscle as needed for anaphylaxis.    Dispense:  2 each    Refill:  1  . fluticasone (FLOVENT HFA) 110 MCG/ACT inhaler    Sig: Inhale 2 puffs into the lungs 2 (two) times daily.    Dispense:  3 Inhaler    Refill:  1    Diagnostics: Spirometry:  Normal with an FEV1 of 100% predicted. This study was performed while the patient was asymptomatic.  Please see scanned spirometry results for details.    Physical examination: Blood pressure 122/64, pulse 78, temperature 97.9 F (36.6 C), temperature source Oral, resp. rate 16, height 5' 5.2" (1.656 m), weight 137 lb 9.1 oz (62.4 kg), last menstrual period 08/23/2016, SpO2 98 %.  General: Alert, interactive, in no acute distress. HEENT: TMs pearly gray, turbinates mildly edematous with thick discharge, post-pharynx moderately erythematous. Neck: Supple without lymphadenopathy. Lungs: Clear to auscultation without wheezing, rhonchi or rales. CV: Normal S1, S2 without murmurs. Skin: Warm and dry, without lesions or rashes.  The following portions of the patient's history were reviewed and updated as appropriate: allergies, current medications, past family history, past medical history, past social history, past surgical history and problem list.   Current Outpatient Medications  Medication Sig Dispense  Refill  . Azelastine HCl 0.15 % SOLN Place 2 sprays into the nose 2 (two) times daily.     . Collagenase POWD 1 Scoop by Does not apply route daily.    . diclofenac Sodium (VOLTAREN) 1 % GEL as needed.    . fluticasone (FLOVENT HFA) 110 MCG/ACT inhaler Inhale 2 puffs into the lungs 2 (two) times daily. 3 Inhaler 1  . IMVEXXY MAINTENANCE PACK 10 MCG INST Place 1 capsule vaginally 2 (two) times a week.    . levothyroxine (SYNTHROID) 50 MCG tablet Take 1 tablet (50 mcg total) by mouth daily before breakfast. 90 tablet 1  . Mepolizumab (NUCALA) 100 MG/ML SOAJ Inject 300 mg into the skin every 28 (twenty-eight) days. 3 mL 11  . pravastatin (PRAVACHOL) 40 MG tablet Take 1qd (Plz sched appt for future fills) 90 tablet 3  . predniSONE (STERAPRED UNI-PAK 21 TAB) 10 MG (21) TBPK tablet Take by mouth.    Marland Kitchen VITAMIN D, ERGOCALCIFEROL, PO Take 1 tablet by mouth daily.    . budesonide-formoterol (SYMBICORT) 80-4.5 MCG/ACT inhaler Inhale 2 puffs into the lungs 2 (two) times daily. (Patient not taking: Reported on 02/27/2020) 1 Inhaler 5  . EPINEPHrine (AUVI-Q) 0.3 mg/0.3 mL IJ SOAJ injection Inject 0.3 mLs (0.3 mg total) into the muscle as needed for anaphylaxis. 2 each 1   Current Facility-Administered Medications  Medication Dose Route Frequency Provider Last Rate Last Admin  . Mepolizumab SOLR 300 mg  300 mg Subcutaneous Q28 days Saleah Rishel, Sed[place] Muta, MD   300 mg at 04/12/19 1437    Allergies  Allergen Reactions  . Amoxil [Amoxicillin]   . Imuran [Azathioprine] Rash    fever  . Penicillins Rash   Review of systems: Review of systems negative except as noted in HPI / PMHx.  Past Medical History:  Diagnosis Date  . Allergic rhinitis   . Asthma   . Gallstone   . Hypothyroidism   . Thyroid nodule     Family History  Problem Relation Age of Onset  . Hypertension Mother   . Arthritis Mother   . Hypertension Father   . Arthritis Father   . Neuropathy Other   . Lymphoma Paternal Uncle   .  Cancer Paternal Grandfather   . Stroke Paternal Grandmother     Social History   Socioeconomic History  . Marital status: Married    Spouse name: Not on file  . Number of children: Not on file  . Years of education: Not on file  . Highest education level: Not on file  Occupational History    Employer: SELF EMPLOYED    Comment: p/t with husband  Tobacco Use  . Smoking status: Never Smoker  . Smokeless tobacco: Never Used  Vaping Use  . Vaping Use: Never used  Substance and Sexual Activity  . Alcohol use: Yes    Alcohol/week: 14.0 standard drinks    Types: 14 Glasses of wine per week  . Drug use: No  . Sexual activity: Yes    Partners: Male  Other Topics Concern  . Not on file  Social History Narrative   Regular exercise- yes    Social Determinants of Health   Financial Resource Strain:   . Difficulty of Paying Living Expenses:  Food Insecurity:   . Worried About Charity fundraiser in the Last Year:   . Arboriculturist in the Last Year:   Transportation Needs:   . Film/video editor (Medical):   Marland Kitchen Lack of Transportation (Non-Medical):   Physical Activity:   . Days of Exercise per Week:   . Minutes of Exercise per Session:   Stress:   . Feeling of Stress :   Social Connections:   . Frequency of Communication with Friends and Family:   . Frequency of Social Gatherings with Friends and Family:   . Attends Religious Services:   . Active Member of Clubs or Organizations:   . Attends Archivist Meetings:   Marland Kitchen Marital Status:   Intimate Partner Violence:   . Fear of Current or Ex-Partner:   . Emotionally Abused:   Marland Kitchen Physically Abused:   . Sexually Abused:     I appreciate the opportunity to take part in Jenni's care. Please do not hesitate to contact me with questions.  Sincerely,   R. Edgar Frisk, MD

## 2020-08-07 ENCOUNTER — Other Ambulatory Visit: Payer: Self-pay | Admitting: Family Medicine

## 2020-08-28 ENCOUNTER — Ambulatory Visit (INDEPENDENT_AMBULATORY_CARE_PROVIDER_SITE_OTHER): Payer: Commercial Managed Care - PPO | Admitting: Allergy and Immunology

## 2020-08-28 ENCOUNTER — Other Ambulatory Visit: Payer: Self-pay

## 2020-08-28 ENCOUNTER — Encounter: Payer: Self-pay | Admitting: Allergy and Immunology

## 2020-08-28 DIAGNOSIS — D7218 Eosinophilia in diseases classified elsewhere: Secondary | ICD-10-CM

## 2020-08-28 DIAGNOSIS — J320 Chronic maxillary sinusitis: Secondary | ICD-10-CM

## 2020-08-28 DIAGNOSIS — M301 Polyarteritis with lung involvement [Churg-Strauss]: Secondary | ICD-10-CM

## 2020-08-28 DIAGNOSIS — J4530 Mild persistent asthma, uncomplicated: Secondary | ICD-10-CM

## 2020-08-28 MED ORDER — BUDESONIDE 0.5 MG/2ML IN SUSP: RESPIRATORY_TRACT | 5 refills | Status: DC

## 2020-08-28 NOTE — Assessment & Plan Note (Signed)
   Continue mepolizomab injections as prescribed as well as treatment plan outlined above. 

## 2020-08-28 NOTE — Patient Instructions (Addendum)
Chronic maxillary sinusitis  Start budesonide/saline nasal irrigation twice a day.   A NeilMed bottle has been provided.    A prescription has been provided for budesonide 0.5 mg respules and instructions for mixing and adminstering the rinse have been discussed and provided in written form.  Follow-up with Dr. Suszanne Conners as recommended.  Mild persistent asthma  Continue albuterol HFA, 1 to 2 inhalations every 4-6 hours if needed.  During respiratory tract infections or asthma flares, add Flovent 110g 2 inhalations via spacer device 2 times per day until symptoms have returned to baseline.  Subjective and objective measures of pulmonary function will be followed and the treatment plan will be adjusted accordingly.  EGPA  Continue mepolizomab injections as prescribed as well as treatment plan outlined above.   Return in about 5 months (around 01/26/2021), or if symptoms worsen or fail to improve.

## 2020-08-28 NOTE — Assessment & Plan Note (Signed)
   Start budesonide/saline nasal irrigation twice a day.   A NeilMed bottle has been provided.    A prescription has been provided for budesonide 0.5 mg respules and instructions for mixing and adminstering the rinse have been discussed and provided in written form.  Follow-up with Dr. Suszanne Conners as recommended.

## 2020-08-28 NOTE — Assessment & Plan Note (Signed)
   Continue albuterol HFA, 1 to 2 inhalations every 4-6 hours if needed.  During respiratory tract infections or asthma flares, add Flovent 110g 2 inhalations via spacer device 2 times per day until symptoms have returned to baseline.  Subjective and objective measures of pulmonary function will be followed and the treatment plan will be adjusted accordingly. 

## 2020-08-28 NOTE — Progress Notes (Signed)
Follow-up Note  RE: Judy Ramos MRN: [id number] DOB: [birth year] Date of Office Visit: 08/28/2020  Primary care provider: Pearline Cables, MD Referring provider: Pearline Cables, MD  History of present illness: Judy Ramos is a 59 y.o. female with the GPA, moderate persistent asthma, and chronic rhinitis presenting today for follow-up.  She was last seen in this clinic on February 27, 2020.  She reports that in the interval since her previous visit her asthma has been well controlled.  She rarely requires albuterol rescue and denies limitations in normal daily activities or nocturnal awakenings due to lower respiratory symptoms.  However, she does report that she has had 4 sinus infections over the past 6 months.  She reports that Flonase and Xhance "do not do much" to relieve her symptoms.  She admits that she is not using nasal saline rinse.  She has been seeing Dr. Suszanne Conners for treatment of acute sinusitis.  She continues to receive mepolizumab injections, without problems or complications, for a GPA.  Assessment and plan: Chronic maxillary sinusitis  Start budesonide/saline nasal irrigation twice a day.   A NeilMed bottle has been provided.    A prescription has been provided for budesonide 0.5 mg respules and instructions for mixing and adminstering the rinse have been discussed and provided in written form.  Follow-up with Dr. Suszanne Conners as recommended.  Mild persistent asthma  Continue albuterol HFA, 1 to 2 inhalations every 4-6 hours if needed.  During respiratory tract infections or asthma flares, add Flovent 110g 2 inhalations via spacer device 2 times per day until symptoms have returned to baseline.  Subjective and objective measures of pulmonary function will be followed and the treatment plan will be adjusted accordingly.  EGPA  Continue mepolizomab injections as prescribed as well as treatment plan outlined above.   Meds ordered this encounter  Medications  .  budesonide (PULMICORT) 0.5 MG/2ML nebulizer solution    Sig: Use as directed    Dispense:  120 mL    Refill:  5    Diagnostics: Due to Covid precautions, spirometry was not performed today as the patient's symptoms are reported as well controlled and pulmonary exam was normal.    Physical examination: Blood pressure 100/60, pulse 78, temperature 97.9 F (36.6 C), temperature source Tympanic, resp. rate 16, height 5\' 5"  (1.651 m), weight 145 lb 15.1 oz (66.2 kg), last menstrual period 08/23/2016, SpO2 98 %.  General: Alert, interactive, in no acute distress. HEENT: TMs pearly gray, turbinates moderately edematous without discharge, post-pharynx mildly erythematous. Neck: Supple without lymphadenopathy. Lungs: Clear to auscultation without wheezing, rhonchi or rales. CV: Normal S1, S2 without murmurs. Skin: Warm and dry, without lesions or rashes.  The following portions of the patient's history were reviewed and updated as appropriate: allergies, current medications, past family history, past medical history, past social history, past surgical history and problem list.  Current Outpatient Medications  Medication Sig Dispense Refill  . Azelastine HCl 0.15 % SOLN Place 2 sprays into the nose 2 (two) times daily.     . budesonide (PULMICORT) 0.5 MG/2ML nebulizer solution Use as directed 120 mL 5  . Collagenase POWD 1 Scoop by Does not apply route daily.    . diclofenac Sodium (VOLTAREN) 1 % GEL as needed.    08/25/2016 EPINEPHrine (AUVI-Q) 0.3 mg/0.3 mL IJ SOAJ injection Inject 0.3 mLs (0.3 mg total) into the muscle as needed for anaphylaxis. 2 each 1  . fluticasone (FLOVENT HFA) 110 MCG/ACT inhaler Inhale 2 puffs  into the lungs 2 (two) times daily. (Patient taking differently: Inhale 2 puffs into the lungs as needed.) 3 Inhaler 1  . IMVEXXY MAINTENANCE PACK 10 MCG INST Place 1 capsule vaginally 2 (two) times a week.    . levothyroxine (SYNTHROID) 50 MCG tablet TAKE 1 TABLET BY MOUTH  DAILY BEFORE  BREAKFAST 90 tablet 3  . Mepolizumab (NUCALA) 100 MG/ML SOAJ Inject 300 mg into the skin every 28 (twenty-eight) days. 3 mL 11  . pravastatin (PRAVACHOL) 40 MG tablet Take 1qd (Plz sched appt for future fills) 90 tablet 3  . VITAMIN D, ERGOCALCIFEROL, PO Take 1 tablet by mouth daily.     Current Facility-Administered Medications  Medication Dose Route Frequency Provider Last Rate Last Admin  . Mepolizumab SOLR 300 mg  300 mg Subcutaneous Q28 days Arlinda Barcelona, Heywood Iles, MD   300 mg at 04/12/19 1437    Allergies  Allergen Reactions  . Amoxil [Amoxicillin]   . Imuran [Azathioprine] Rash    fever  . Penicillins Rash   Review of systems: Review of systems negative except as noted in HPI / PMHx.  Past Medical History:  Diagnosis Date  . Allergic rhinitis   . Asthma   . Gallstone   . Hypothyroidism   . Thyroid nodule     Family History  Problem Relation Age of Onset  . Hypertension Mother   . Arthritis Mother   . Hypertension Father   . Arthritis Father   . Neuropathy Other   . Lymphoma Paternal Uncle   . Cancer Paternal Grandfather   . Stroke Paternal Grandmother     Social History   Socioeconomic History  . Marital status: Married    Spouse name: Not on file  . Number of children: Not on file  . Years of education: Not on file  . Highest education level: Not on file  Occupational History    Employer: SELF EMPLOYED    Comment: p/t with husband  Tobacco Use  . Smoking status: Never Smoker  . Smokeless tobacco: Never Used  Vaping Use  . Vaping Use: Never used  Substance and Sexual Activity  . Alcohol use: Yes    Alcohol/week: 14.0 standard drinks    Types: 14 Glasses of wine per week  . Drug use: No  . Sexual activity: Yes    Partners: Male  Other Topics Concern  . Not on file  Social History Narrative   Regular exercise- yes    Social Determinants of Health   Financial Resource Strain: Not on file  Food Insecurity: Not on file  Transportation Needs:  Not on file  Physical Activity: Not on file  Stress: Not on file  Social Connections: Not on file  Intimate Partner Violence: Not on file    I appreciate the opportunity to take part in [place] care. Please do not hesitate to contact me with questions.  Sincerely,   R. Jorene Guest, MD

## 2020-09-01 ENCOUNTER — Other Ambulatory Visit: Payer: Self-pay

## 2020-09-01 ENCOUNTER — Telehealth: Payer: Self-pay | Admitting: Allergy and Immunology

## 2020-09-01 MED ORDER — BUDESONIDE 0.5 MG/2ML IN SUSP: RESPIRATORY_TRACT | 5 refills | Status: DC

## 2020-09-01 NOTE — Telephone Encounter (Signed)
PT calling to get budesonide sent to [facility] [address], [employer]. She has a good rx discount that make it cheaper at [employer]. [facility] price [facility].

## 2020-09-01 NOTE — Telephone Encounter (Signed)
Refill sent in

## 2020-09-28 ENCOUNTER — Other Ambulatory Visit: Payer: Self-pay | Admitting: Allergy and Immunology

## 2021-01-24 ENCOUNTER — Other Ambulatory Visit: Payer: Self-pay | Admitting: Family Medicine

## 2021-01-24 DIAGNOSIS — E785 Hyperlipidemia, unspecified: Secondary | ICD-10-CM

## 2021-03-02 ENCOUNTER — Other Ambulatory Visit: Payer: Self-pay | Admitting: Family Medicine

## 2021-03-02 DIAGNOSIS — E785 Hyperlipidemia, unspecified: Secondary | ICD-10-CM

## 2021-03-03 ENCOUNTER — Telehealth: Payer: Commercial Managed Care - PPO | Admitting: *Deleted

## 2021-03-03 NOTE — Telephone Encounter (Signed)
L/m for patient to advise no prior auth needed for new Ins paln but will need to change pharmacy to [facility]

## 2021-04-20 ENCOUNTER — Telehealth: Payer: Self-pay | Admitting: Allergy

## 2021-04-20 ENCOUNTER — Other Ambulatory Visit: Payer: Self-pay

## 2021-04-20 ENCOUNTER — Ambulatory Visit (INDEPENDENT_AMBULATORY_CARE_PROVIDER_SITE_OTHER): Payer: No Typology Code available for payment source | Admitting: Allergy

## 2021-04-20 VITALS — BP 124/68 | HR 58 | Temp 97.7°F | Resp 16 | Ht 65.0 in | Wt 140.4 lb

## 2021-04-20 DIAGNOSIS — M301 Polyarteritis with lung involvement [Churg-Strauss]: Secondary | ICD-10-CM | POA: Diagnosis not present

## 2021-04-20 DIAGNOSIS — J4530 Mild persistent asthma, uncomplicated: Secondary | ICD-10-CM

## 2021-04-20 DIAGNOSIS — J320 Chronic maxillary sinusitis: Secondary | ICD-10-CM | POA: Diagnosis not present

## 2021-04-20 DIAGNOSIS — D7218 Eosinophilia in diseases classified elsewhere: Secondary | ICD-10-CM

## 2021-04-20 DIAGNOSIS — J3089 Other allergic rhinitis: Secondary | ICD-10-CM | POA: Diagnosis not present

## 2021-04-20 NOTE — Progress Notes (Signed)
Follow Up Note  RE: Judy Ramos MRN: [id number] DOB: 1960/10/22 Date of Office Visit: 04/20/2021  Referring provider: Darreld Mclean, MD Primary care provider: Darreld Mclean, MD  Chief Complaint: Follow-up (Patient formally with Dr Verlin Fester and needs to establish with someone else. Just changed insurance and Nucala is no longer covered. )  History of Present Illness: I had the pleasure of seeing Judy Ramos for a follow up visit at the [facility] on 04/21/2021. She is a 60 y.o. 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 Her previous allergy office visit was on 08/28/2020 with Dr. Verlin Fester. Today is a regular follow up visit.   EGPA Patient had a bronchoscopy in 2017 which showed high levels of eosinophils.  At that time she tried multiple inhalers with minimal benefit.  Patient has several sinus surgeries due to chronic sinusitis - first one in September 2016.  She required daily oral prednisone and steroid injections to control her sinus/respiratory symptoms.   2017 skin testing was only positive to dust mites.   Patient was also seen by rheumatology in the past and was tried on azathioprine with no benefit. Now only follows with them prn.  She denies having any neurological, cardiac, GI, renal symptoms.   She was started on mepolizumab '300mg'$  injections every 4 weeks in 2018 and has been doing well on it. Apparently the lower doses did not help as much as the '300mg'$  dose.  Patient does at home injections with no issues - husband administers them for her in the arms.  Denies any respiratory symptoms. Only using albuterol every 3-4 months.  Currently using Flonase 1 spray per nostril BID and follows with Dr. Benjamine Mola - tried budesonide saline wash and Xhance with no benefit.   Patient was due for Nucala last Tuesday but due to insurance issues she is not able to afford it. Apparently she used all the co-pay assistance  money for the last injection. Patient's gap insurance is requiring $10K copay for the Nucala for this month.  Patient states that her new insurance is going to start in October 2022.  Past workup: 2017  Ige 237 Pneumococcal titers and tetanus titers protective Normal immunoglobulin levels  Bronchoscopy April 2017.  68 eosinophils in BAL. 12/04/2015 - CBC with diff - eosinophils of 488; no anemia 11/11/2015 - ANCA screen negative; PR3 - undetectable; MPO 1.6 11/12/2015- CRP negative.  2017 CT chest: "IMPRESSION: 1. Collapse of the right upper lobe anteriorly and medially with obstruction of the right upper lobe bronchus. Recommend bronchoscopy to exclude an endobronchial lesion. No definite mass or adenopathy is seen. 2. Mild curvature of the thoracic spine convex to the right with mild degenerative spurring."  2017 Ct sinus: "IMPRESSION: Pan sinusitis."  2017 [place] allergy visit: "Impression:  60 year old female with pulmonary symptoms since December of 2014 presents to [place] regarding a second opinion of a possible diagnosis of EGPA.  She has adult onset asthma that developed in 2014 which I would classify as severe persistent. It had been poorly controlled prior but now appears to be well controlled on chronic oral prednisone, high dose Dulera, and Nucala. Given she is well controlled on Nucala at this point and given the harmful side effects on chronic prednisone therapy, would try tapering down prednisone again. Would try the taper very slowly - consider going down by 2.5 mg every week or slower as needed.  Regards to possible diagnosis of EGPA,  many features of her presentation do not fit the diagnosis. First, the time line of her presentation is not consistent - usually the asthma phase is in the 20-30s, and does not present in 45s. Secondly, eosinophila in BAL is a non specific findings. Third, her peripheral eosinophilia is not impressive (although most recently on chronic  prednisone) and in past has been undetectable. With EGPA, we expect peripheral eosinophilia in the thousands. Her anti-MPO was positive but this is not a very specific finding. Given these findings, we do not think she has EGPA but would recommend she see a rheumatologist for evaluation."  2017 rheumatology notes reviewed: Agreed with EGPA diagnosis.   Assessment and Plan: [place] is a 60 y.o. 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 Patient was seen by pulmonology, rheumatology, ENT, [facility]. Tried other EGPA medications with rheumatology with no benefit. Nucala '300mg'$  dosing every 4 weeks seems to work the best for her - still had issues with lower doses. She has been able to wean off oral prednisone and ICS inhaler completely. Interim history - patient's insurance changed and was due for Nucala last week. Insurance asking $10K for the dose. New insurance starting in October. Unfortunately we did not have Nucala '300mg'$  samples in the office today. Will contact rep to see if we can bridge the patient with samples for this month and afterwards her new insurance should kick in. Continue Nucala '300mg'$  every 4 weeks at home.   Mild persistent asthma Well-controlled with no daily inhaler. Only needed to use albuterol once every 3-4 months. Today's spirometry was normal. Daily controller medication(s): none. During upper respiratory infections/asthma flares: start Flovent 160mg 2 puffs twice a day with spacer and rinse mouth afterwards for 1-2 weeks until your breathing symptoms return to baseline.  May use albuterol rescue inhaler 2 puffs every 4 to 6 hours as needed for shortness of breath, chest tightness, coughing, and wheezing. May use albuterol rescue inhaler 2 puffs 5 to 15 minutes prior to strenuous physical  activities. Monitor frequency of use.  Get spirometry at next visit.  Chronic maxillary sinusitis Issues with chronic sinusitis with sinus surgery in 2017. Doing well with Flonase. Xhance and budesonide nasal wash did not help. Normal immunoglobulin levels with good titers against s. Pneumonia in the past.  Use Flonase (fluticasone) nasal spray 1 spray per nostril twice a day as needed for nasal congestion.  Nasal saline spray (i.e., Simply Saline) or nasal saline lavage (i.e., NeilMed) is recommended as needed and prior to medicated nasal sprays. Follow up with ENT as scheduled - Dr. TBenjamine Mola Keep track of infections and antibiotics use.  Perennial allergic rhinitis Past history - 2017 intradermal testing positive to dust mites only. Interim history - stable. Continue environmental control measures.  Use Flonase (fluticasone) nasal spray 1 spray per nostril twice a day as needed for nasal congestion.  Nasal saline spray (i.e., Simply Saline) or nasal saline lavage (i.e., NeilMed) is recommended as needed and prior to medicated nasal sprays.  Return in about 4 months (around 08/20/2021).  No orders of the defined types were placed in this encounter.  Lab Orders  No laboratory test(s) ordered today    Diagnostics: Spirometry:  Tracings reviewed. Her effort: Good reproducible efforts. FVC: 3.19L FEV1: 2.44L, 89% predicted FEV1/FVC ratio: 76% Interpretation: Spirometry consistent with normal pattern.  Please see scanned spirometry results for details.  Medication List:  Current Outpatient Medications  Medication Sig Dispense Refill   EPINEPHrine (AUVI-Q) 0.3 mg/0.3 mL IJ SOAJ injection Inject 0.3 mLs (0.3 mg total) into the muscle as needed for anaphylaxis. 2 each 1   fluticasone (FLONASE) 50 MCG/ACT nasal spray Place 1 spray into both nostrils 2 (two) times daily.     fluticasone (FLOVENT HFA) 110 MCG/ACT inhaler Inhale 2 puffs into the lungs 2 (two) times daily. (Patient taking  differently: Inhale 2 puffs into the lungs as needed.) 3 Inhaler 1   levothyroxine (SYNTHROID) 50 MCG tablet TAKE 1 TABLET BY MOUTH  DAILY BEFORE BREAKFAST 90 tablet 3   pravastatin (PRAVACHOL) 40 MG tablet TAKE 1 TABLET BY MOUTH  DAILY 30 tablet 0   VITAMIN D, ERGOCALCIFEROL, PO Take 1 tablet by mouth daily.     Collagenase POWD 1 Scoop by Does not apply route daily.     NUCALA 100 MG/ML SOAJ INJECT '300MG'$  SUBCUTANEOUSLY EVERY 4 WEEKS (GIVEN AT MD  OFFICE) (Patient not taking: Reported on 04/20/2021) 3 mL 11   Current Facility-Administered Medications  Medication Dose Route Frequency Provider Last Rate Last Admin   Mepolizumab SOLR 300 mg  300 mg Subcutaneous Q28 days Bobbitt, Sedalia Muta, MD   300 mg at 04/12/19 1437   Allergies: Allergies  Allergen Reactions   Amoxil [Amoxicillin]    Imuran [Azathioprine] Rash    fever   Penicillins Rash   I reviewed her past medical history, social history, family history, and environmental history and no significant changes have been reported from her previous visit.  Review of Systems  Constitutional:  Negative for appetite change, chills, fever and unexpected weight change.  HENT:  Negative for congestion and rhinorrhea.   Eyes:  Negative for itching.  Respiratory:  Negative for cough, chest tightness, shortness of breath and wheezing.   Gastrointestinal:  Negative for abdominal pain.  Skin:  Negative for rash.  Allergic/Immunologic: Positive for environmental allergies.  Neurological:  Negative for headaches.   Objective: BP 124/68 (BP Location: Left Arm, Patient Position: Sitting, Cuff Size: Normal)   Pulse (!) 58   Temp 97.7 F (36.5 C) (Temporal)   Resp 16   Ht '5\' 5"'$  (1.651 m)   Wt 140 lb 6.4 oz (63.7 kg)   LMP 08/23/2016   SpO2 100%   BMI 23.36 kg/m  Body mass index is 23.36 kg/m. Physical Exam Vitals and nursing note reviewed.  Constitutional:      Appearance: Normal appearance. She is well-developed.  HENT:     Head:  Normocephalic and atraumatic.     Right Ear: Tympanic membrane and external ear normal.     Left Ear: Tympanic membrane and external ear normal.     Nose: Nose normal.     Mouth/Throat:     Mouth: Mucous membranes are moist.     Pharynx: Oropharynx is clear.  Eyes:     Conjunctiva/sclera: Conjunctivae normal.  Cardiovascular:     Rate and Rhythm: Normal rate and regular rhythm.     Heart sounds: Normal heart sounds. No murmur heard. Pulmonary:     Effort: Pulmonary effort is normal.     Breath sounds: Normal breath sounds. No wheezing, rhonchi or rales.  Musculoskeletal:     Cervical back: Neck supple.  Skin:    General: Skin is warm.     Findings: No rash.  Neurological:     Mental Status: She is alert and oriented to person,  place, and time.  Psychiatric:        Behavior: Behavior normal.   Previous notes and tests were reviewed. The plan was reviewed with the patient/family, and all questions/concerned were addressed.  It was my pleasure to see Judy Ramos today and participate in her care. Please feel free to contact me with any questions or concerns.  Sincerely,  Rexene Alberts, DO Allergy & Immunology  [facility] office: [phone] [place] office: [phone]  I have spent a total of 45 minutes of face-to-face and non-face-to-face time (excluding clinical staff time) preparing to see patient, documenting in the chart, reviewing notes and prior test results, ordering tests and/or medications, and counseling the patient on EGPA, asthma and sinusitis.

## 2021-04-20 NOTE — Telephone Encounter (Signed)
Patient's insurance is changing and currently on a gap insurance and won't cover Nucala.  Her new insurance starts in October 2022.  Judy Pina, do you have the contact info for the Nucala rep? Can you call the Nucala rep and see if they can provide sample for this month for her?  She takes Nucala '300mg'$  every 4 weeks for EGPA - so she will need 3 of the '100mg'$  pens.  Thank you

## 2021-04-20 NOTE — Patient Instructions (Addendum)
EGPA We will try to get Nucala samples for you for this month - if you don't hear back from [place] by the end of this week please call the office. Hopefully after this month your new insurance will cover the Nucala injections.   Sinus: Use Flonase (fluticasone) nasal spray 1 spray per nostril twice a day as needed for nasal congestion.  Nasal saline spray (i.e., Simply Saline) or nasal saline lavage (i.e., NeilMed) is recommended as needed and prior to medicated nasal sprays. Follow up with ENT as scheduled.  Asthma Normal breathing test today. Daily controller medication(s): none. During upper respiratory infections/asthma flares: start Flovent 124mg 2 puffs twice a day with spacer and rinse mouth afterwards for 1-2 weeks until your breathing symptoms return to baseline.  May use albuterol rescue inhaler 2 puffs every 4 to 6 hours as needed for shortness of breath, chest tightness, coughing, and wheezing. May use albuterol rescue inhaler 2 puffs 5 to 15 minutes prior to strenuous physical activities. Monitor frequency of use.  Asthma control goals:  Full participation in all desired activities (may need albuterol before activity) Albuterol use two times or less a week on average (not counting use with activity) Cough interfering with sleep two times or less a month Oral steroids no more than once a year No hospitalizations   Follow up in 4 months or sooner if needed.

## 2021-04-21 ENCOUNTER — Encounter: Payer: Self-pay | Admitting: Allergy

## 2021-04-21 DIAGNOSIS — J3089 Other allergic rhinitis: Secondary | ICD-10-CM | POA: Insufficient documentation

## 2021-04-21 NOTE — Telephone Encounter (Signed)
Okay. Thank you.

## 2021-04-21 NOTE — Assessment & Plan Note (Signed)
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 Patient was seen by pulmonology, rheumatology, ENT, [facility]. Tried other EGPA medications with rheumatology with no benefit. Nucala '300mg'$  dosing every 4 weeks seems to work the best for her - still had issues with lower doses. She has been able to wean off oral prednisone and ICS inhaler completely. Interim history - patient's insurance changed and was due for Nucala last week. Insurance asking $10K for the dose. New insurance starting in October.  Unfortunately we did not have Nucala '300mg'$  samples in the office today.  Will contact rep to see if we can bridge the patient with samples for this month and afterwards her new insurance should kick in. Lawerance Bach '300mg'$  every 4 weeks at home.

## 2021-04-21 NOTE — Assessment & Plan Note (Signed)
Well-controlled with no daily inhaler. Only needed to use albuterol once every 3-4 months.  Today's spirometry was normal. . Daily controller medication(s): none. . During upper respiratory infections/asthma flares: start Flovent 157mg 2 puffs twice a day with spacer and rinse mouth afterwards for 1-2 weeks until your breathing symptoms return to baseline.  . May use albuterol rescue inhaler 2 puffs every 4 to 6 hours as needed for shortness of breath, chest tightness, coughing, and wheezing. May use albuterol rescue inhaler 2 puffs 5 to 15 minutes prior to strenuous physical activities. Monitor frequency of use.  . Get spirometry at next visit.

## 2021-04-21 NOTE — Assessment & Plan Note (Signed)
Past history - 2017 intradermal testing positive to dust mites only. Interim history - stable.  Continue environmental control measures.  Use Flonase (fluticasone) nasal spray 1 spray per nostril twice a day as needed for nasal congestion.  Nasal saline spray (i.e., Simply Saline) or nasal saline lavage (i.e., NeilMed) is recommended as needed and prior to medicated nasal sprays.

## 2021-04-21 NOTE — Telephone Encounter (Signed)
I have reached out to mark our Nucala rep and left a message for him to call the office in regards to getting more samples. We have one 100 mg pen in our [place] office and two 100 mg pens in our [place] office. We have placed patients name on the one in [place] and will bring the two from [place] tomorrow 04/22/2021 to [place] on Thursday 04/23/2021. Once all three pens are in [place] we will call the patient to inform her.

## 2021-04-21 NOTE — Assessment & Plan Note (Addendum)
Issues with chronic sinusitis with sinus surgery in 2017. Doing well with Flonase. Xhance and budesonide nasal wash did not help. Normal immunoglobulin levels with good titers against s. Pneumonia in the past.  . Use Flonase (fluticasone) nasal spray 1 spray per nostril twice a day as needed for nasal congestion.  . Nasal saline spray (i.e., Simply Saline) or nasal saline lavage (i.e., NeilMed) is recommended as needed and prior to medicated nasal sprays. . Follow up with ENT as scheduled - Dr. Benjamine Mola. . Keep track of infections and antibiotics use.

## 2021-04-23 NOTE — Telephone Encounter (Signed)
Called and advised to patient that we have 3 Nucala samples available pickup in the [place] office. Patient verbalized understanding and will be by tomorrow to pickup.

## 2021-06-08 ENCOUNTER — Other Ambulatory Visit: Payer: Self-pay

## 2021-06-08 ENCOUNTER — Telehealth: Payer: Self-pay | Admitting: Family Medicine

## 2021-06-08 DIAGNOSIS — E785 Hyperlipidemia, unspecified: Secondary | ICD-10-CM

## 2021-06-08 MED ORDER — PRAVASTATIN SODIUM 40 MG PO TABS: 40.0000 mg | ORAL_TABLET | Freq: Every day | ORAL | 0 refills | Status: DC

## 2021-06-08 MED ORDER — LEVOTHYROXINE SODIUM 50 MCG PO TABS: 50.0000 ug | ORAL_TABLET | Freq: Every day | ORAL | 0 refills | Status: DC

## 2021-06-08 NOTE — Telephone Encounter (Signed)
Medication:  1.pravastatin (PRAVACHOL) 40 MG tablet 2. levothyroxine (SYNTHROID) 50 MCG tablet   Has the patient contacted their pharmacy? Yes.   (If no, request that the patient contact the pharmacy for the refill.) (If yes, when and what did the pharmacy advise?)  Preferred Pharmacy (with phone number or street name):  [facility] [address] [place], [place] [address] Phone: [phone]  Agent: Please be advised that RX refills may take up to 3 business days. We ask that you follow-up with your pharmacy.

## 2021-06-08 NOTE — Telephone Encounter (Signed)
30 day supply given since pt needs an OV.

## 2021-07-01 ENCOUNTER — Other Ambulatory Visit: Payer: Self-pay | Admitting: Family Medicine

## 2021-07-01 DIAGNOSIS — E785 Hyperlipidemia, unspecified: Secondary | ICD-10-CM

## 2021-07-06 ENCOUNTER — Other Ambulatory Visit: Payer: Self-pay | Admitting: Family Medicine

## 2021-07-06 DIAGNOSIS — E785 Hyperlipidemia, unspecified: Secondary | ICD-10-CM

## 2021-07-09 NOTE — Patient Instructions (Incomplete)
Good to see you again today  

## 2021-07-09 NOTE — Progress Notes (Deleted)
[place] at [facility] [address] [place], [place] [address] [phone]  Date:  07/15/2021   Name:  Judy Ramos   DOB:  1961-05-07   MRN:  [id number]  PCP:  Darreld Mclean, MD    Chief Complaint: No chief complaint on file.   History of Present Illness:  Judy Ramos is a 60 y.o. very pleasant female patient who presents with the following:  Pt seen today for a CPE Last visit with myself 5/21 Patient with history of moderate persistent asthma, reflux, hypothyroidism, dyslipidemia, Churg Strass syndrome, prednisone related osteopenia here today for routine physical Her asthma is thought related to her ChurgAltamese Cabal syndrome.  This is managed by [facility] rheumatology, she also sees asthma/allergy with Dr. 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 Patient was seen by pulmonology, rheumatology, ENT, [facility] opinion allergy at academic center in the past. Tried other EGPA medications with rheumatology with no benefit. Nucala 300mg  dosing every 4 weeks seems to work the best for her - still had issues with lower doses. She has been able to wean off oral prednisone and ICS inhaler completely. Interim history - patient's insurance changed and was due for Nucala last week. Insurance asking $10K for the dose. New insurance starting in October. Unfortunately we did not have Nucala 300mg  samples in the office today. Will contact rep to see if we can bridge the patient with samples for this month and afterwards her new insurance should kick in. Continue Nucala 300mg  every 4 weeks at home.  Mild persistent asthma Well-controlled with no daily inhaler. Only needed to use albuterol once every 3-4 months. Today's spirometry was normal. Daily controller medication(s):  none. During upper respiratory infections/asthma flares: start Flovent 170mcg 2 puffs twice a day with spacer and rinse mouth afterwards for 1-2 weeks until your breathing symptoms return to baseline.  May use albuterol rescue inhaler 2 puffs every 4 to 6 hours as needed for shortness of breath, chest tightness, coughing, and wheezing. May use albuterol rescue inhaler 2 puffs 5 to 15 minutes prior to strenuous physical activities. Monitor frequency of use.  Get spirometry at next visit. Chronic maxillary sinusitis Issues with chronic sinusitis with sinus surgery in 2017. Doing well with Flonase. Xhance and budesonide nasal wash did not help. Normal immunoglobulin levels with good titers against s. Pneumonia in the past.  Use Flonase (fluticasone) nasal spray 1 spray per nostril twice a day as needed for nasal congestion.  Nasal saline spray (i.e., Simply Saline) or nasal saline lavage (i.e., NeilMed) is recommended as needed and prior to medicated nasal sprays. Follow up with ENT as scheduled - Dr. Benjamine Mola. Keep track of infections and antibiotics use. Perennial allergic rhinitis Past history - 2017 intradermal testing positive to dust mites only. Interim history - stable. Continue environmental control measures.  Use Flonase (fluticasone) nasal spray 1 spray per nostril twice a day as needed for nasal congestion.  Nasal saline spray (i.e., Simply Saline) or nasal saline lavage (i.e., NeilMed) is recommended as needed and prior to medicated nasal sprays.  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   Osteoporosis 09/02/2016   Eosinophilia 04/26/2016   Chronic rhinitis 04/26/2016   Family history of colonic polyps 02/02/2016   EGPA 01/05/2016   Asthma 01/05/2016   Hyperlipidemia 01/05/2016   Mild  persistent asthma 11/13/2015   Perennial allergic rhinitis with a nonallergic component 11/10/2015   Persistent cough 11/10/2015   Esophageal reflux 11/10/2015   Chronic maxillary sinusitis 11/10/2015   History of asthma 11/10/2015   Acute sinusitis 10/22/2013   Rash 12/23/2010   IRREGULAR MENSES 01/20/2010   VARICOSE VEINS LOWER EXTREMITIES W/INFLAMMATION 10/16/2009   CERVICAL POLYP 10/16/2009   INSOMNIA 09/05/2008   Hypothyroid 10/23/2007   THYROID NODULE 02/08/2007    Past Medical History:  Diagnosis Date   Allergic rhinitis    Asthma    Gallstone    Hypothyroidism    Thyroid nodule     Past Surgical History:  Procedure Laterality Date   AUGMENTATION MAMMAPLASTY Bilateral    BREAST ENHANCEMENT SURGERY  2001   CESAREAN SECTION     x 2   CHOLECYSTECTOMY  05/1999   DILATION AND CURETTAGE OF UTERUS  2011   LASIK     uterine ablation  2011   VEIN LIGATION AND STRIPPING     VIDEO BRONCHOSCOPY Bilateral 12/10/2015   Procedure: VIDEO BRONCHOSCOPY WITHOUT FLUORO;  Surgeon: Tanda Rockers, MD;  Location: [facility];  Service: Endoscopy;  Laterality: Bilateral;    Social History   Tobacco Use   Smoking status: Never   Smokeless tobacco: Never  Vaping Use   Vaping Use: Never used  Substance Use Topics   Alcohol use: Yes    Alcohol/week: 14.0 standard drinks    Types: 14 Glasses of wine per week   Drug use: No    Family History  Problem Relation Age of Onset   Hypertension Mother    Arthritis Mother    Hypertension Father    Arthritis Father    Neuropathy Other    Lymphoma Paternal Uncle    Cancer Paternal Grandfather    Stroke Paternal Grandmother     Allergies  Allergen Reactions   Amoxil [Amoxicillin]    Imuran [Azathioprine] Rash    fever   Penicillins Rash    Medication list has been reviewed and updated.  Current Outpatient Medications on File Prior to Visit  Medication Sig Dispense Refill   Collagenase POWD 1 Scoop by Does not apply route  daily.     EPINEPHrine (AUVI-Q) 0.3 mg/0.3 mL IJ SOAJ injection Inject 0.3 mLs (0.3 mg total) into the muscle as needed for anaphylaxis. 2 each 1   fluticasone (FLONASE) 50 MCG/ACT nasal spray Place 1 spray into both nostrils 2 (two) times daily.     fluticasone (FLOVENT HFA) 110 MCG/ACT inhaler Inhale 2 puffs into the lungs 2 (two) times daily. (Patient taking differently: Inhale 2 puffs into the lungs as needed.) 3 Inhaler 1   levothyroxine (SYNTHROID) 50 MCG tablet Take 1 tablet (50 mcg total) by mouth daily before breakfast. Pt needs office visit for further refills 14 tablet 0   NUCALA 100 MG/ML SOAJ INJECT 300MG  SUBCUTANEOUSLY EVERY 4 WEEKS (GIVEN AT MD  OFFICE) (Patient not taking: Reported on 04/20/2021) 3 mL 11   pravastatin (PRAVACHOL) 40 MG tablet TAKE 1 TABLET BY MOUTH EVERY DAY 90 tablet 1   VITAMIN D, ERGOCALCIFEROL, PO Take 1 tablet by mouth daily.     Current Facility-Administered Medications on File Prior to Visit  Medication Dose Route Frequency Provider Last Rate Last Admin  Mepolizumab SOLR 300 mg  300 mg Subcutaneous Q28 days Bobbitt, Sedalia Muta, MD   300 mg at 04/12/19 1437    Review of Systems:  As per HPI- otherwise negative.   Physical Examination: There were no vitals filed for this visit. There were no vitals filed for this visit. There is no height or weight on file to calculate BMI. Ideal Body Weight:    GEN: no acute distress. HEENT: Atraumatic, Normocephalic.  Ears and Nose: No external deformity. CV: RRR, No M/G/R. No JVD. No thrill. No extra heart sounds. PULM: CTA B, no wheezes, crackles, rhonchi. No retractions. No resp. distress. No accessory muscle use. ABD: S, NT, ND, +BS. No rebound. No HSM. EXTR: No c/c/e PSYCH: Normally interactive. Conversant.    Assessment and Plan: ***  Signed Lamar Blinks, MD

## 2021-07-15 ENCOUNTER — Encounter: Payer: 59 | Admitting: Family Medicine

## 2021-09-01 NOTE — Progress Notes (Signed)
Follow Up Note  RE: Judy Ramos MRN: [id number] DOB: [birth year] Date of Office Visit: 09/02/2021  Referring provider: Darreld Mclean, MD Primary care provider: Darreld Mclean, MD  Chief Complaint: Other Judy Ramos - has not had an injection since August- no issues since stopping. Feels like she can discontinue shots with no issue. ) and Asthma (Has not needed inhalers no issues with asthma )  History of Present Illness: I had the pleasure of seeing Judy Ramos for a follow up visit at the [facility] on 09/02/2021. She is a 61 y.o. female, who is being followed for E GPA, asthma, chronic sinusitis, allergic rhinitis. Her previous allergy office visit was on 04/20/2021 with Dr. Maudie Mercury. Today is a regular follow up visit.  EGPA Patient picked up the 3 Nucala samples and never administered them as she was not feeling bad. She questions whether she really has this diagnosis or not.  Last Nucala injection was in July 2022. No recent prednisone. Denies any respiratory issues but has been having some nasal congestion for the past week.  Mild persistent asthma Denies any SOB, coughing, wheezing, chest tightness, nocturnal awakenings, ER/urgent care visits or prednisone use since the last visit. No albuterol use.   Chronic maxillary sinusitis No nasal spray use.  No antibiotics or infections since the last visit.  She noted some nasal congestion for the past 8-10 days and thinks she may have an infections brewing in her right sinuses.  Perennial allergic rhinitis Not taking any antihistamines.   Patient is not up to date with flu vaccine or covid-19 vaccine. She got Tdap booster.  Assessment and Plan: Judy Ramos is a 61 y.o. 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 Patient was seen by pulmonology, rheumatology, ENT, [facility] opinion  [facility] at [facility] center in the [facility]. Tried other EGPA medications with rheumatology with no benefit. Nucala 300mg  dosing every 4 weeks seems to work the best for her - still had issues with lower doses. She has been able to wean off oral prednisone and ICS inhaler completely. Interim history - patient's last Nucala was in July and did not take sample doses as she was not feeling any symptoms. Questions her EGPA diagnosis.  Discussed with patient that my experience with EGPA is limited and if she questions this diagnosis I recommend that she goes for further evaluation at an academic center.  Get bloodwork to look at eosinophil counts as she has been off prednisone and Nucala for 4+ months. I'm concerned with her recent nasal congestion symptoms as it may be signs of her EGPA resurfacing.  Start saline irrigations twice a day.  Use Flonase (fluticasone) nasal spray 1 spray per nostril twice a day as needed for nasal congestion.  Call Dr. Benjamine Mola (ENT) and see if he can culture the sinus discharge.  Consider restarting Nucala 100mg  every 4 weeks as a maintenance dose instead of 300mg .  Recommend getting up to date flu vaccine and Covid-19 vaccinations.   Mild persistent asthma Well-controlled with no daily inhaler.  Today's spirometry was normal. Daily controller medication(s): none. During upper respiratory infections/asthma flares: start Flovent 196mcg 2 puffs twice a day with spacer and rinse mouth afterwards for 1-2 weeks until your breathing symptoms return to baseline.  May use albuterol rescue inhaler 2 puffs every 4 to 6 hours as needed for shortness of  breath, chest tightness, coughing, and wheezing. May use albuterol rescue inhaler 2 puffs 5 to 15 minutes prior to strenuous physical activities. Monitor frequency of use.  Get spirometry at next visit.  Perennial allergic rhinitis Past history - 2017 intradermal testing positive to dust mites only. Interim history - increased nasal  congestion.  Continue environmental control measures.  Use Flonase (fluticasone) nasal spray 1 spray per nostril twice a day as needed for nasal congestion.  Nasal saline spray (i.e., Simply Saline) or nasal saline lavage (i.e., NeilMed) is recommended as needed and prior to medicated nasal sprays.  Return in about 2 months (around 10/31/2021).  No orders of the defined types were placed in this encounter.  Lab Orders         CBC with Differential/Platelet         Comprehensive metabolic panel         Urinalysis      Diagnostics: Spirometry:  Tracings reviewed. Her effort: Good reproducible efforts. FVC: 3.21L FEV1: 2.26L, 83% predicted FEV1/FVC ratio: 70% Interpretation: Spirometry consistent with normal pattern.  Please see scanned spirometry results for details.  Medication List:  Current Outpatient Medications  Medication Sig Dispense Refill   Collagenase POWD 1 Scoop by Does not apply route daily.     EPINEPHrine (AUVI-Q) 0.3 mg/0.3 mL IJ SOAJ injection Inject 0.3 mLs (0.3 mg total) into the muscle as needed for anaphylaxis. 2 each 1   fluticasone (FLONASE) 50 MCG/ACT nasal spray Place 1 spray into both nostrils 2 (two) times daily.     fluticasone (FLOVENT HFA) 110 MCG/ACT inhaler Inhale 2 puffs into the lungs 2 (two) times daily. (Patient taking differently: Inhale 2 puffs into the lungs as needed.) 3 Inhaler 1   levothyroxine (SYNTHROID) 50 MCG tablet Take 1 tablet (50 mcg total) by mouth daily before breakfast. Pt needs office visit for further refills 14 tablet 0   pravastatin (PRAVACHOL) 40 MG tablet TAKE 1 TABLET BY MOUTH EVERY DAY 90 tablet 1   VITAMIN D, ERGOCALCIFEROL, PO Take 1 tablet by mouth daily.     NUCALA 100 MG/ML SOAJ INJECT 300MG  SUBCUTANEOUSLY EVERY 4 WEEKS (GIVEN AT MD  OFFICE) (Patient not taking: Reported on 09/02/2021) 3 mL 11   Current Facility-Administered Medications  Medication Dose Route Frequency Provider Last Rate Last Admin   Mepolizumab SOLR  300 mg  300 mg Subcutaneous Q28 days Bobbitt, Sedalia Muta, MD   300 mg at 04/12/19 1437   Allergies: Allergies  Allergen Reactions   Amoxil [Amoxicillin]    Imuran [Azathioprine] Rash    fever   Penicillins Rash   I reviewed her past medical history, social history, family history, and environmental history and no significant changes have been reported from her previous visit.  Review of Systems  Constitutional:  Negative for appetite change, chills, fever and unexpected weight change.  HENT:  Positive for congestion. Negative for rhinorrhea.   Eyes:  Negative for itching.  Respiratory:  Negative for cough, chest tightness, shortness of breath and wheezing.   Gastrointestinal:  Negative for abdominal pain.  Skin:  Negative for rash.  Allergic/Immunologic: Positive for environmental allergies.  Neurological:  Negative for headaches.   Objective: BP 118/70    Pulse 76    Temp 97.6 F (36.4 C)    Resp 18    Ht 5\' 5"  (1.651 m)    Wt 139 lb (63 kg)    LMP 08/23/2016    SpO2 100%    BMI 23.13 kg/m  Body  mass index is 23.13 kg/m. Physical Exam Vitals and nursing note reviewed.  Constitutional:      Appearance: Normal appearance. She is well-developed.  HENT:     Head: Normocephalic and atraumatic.     Right Ear: Tympanic membrane and external ear normal.     Left Ear: Tympanic membrane and external ear normal.     Nose:     Comments: Right sided nasal mucous discharge.    Mouth/Throat:     Mouth: Mucous membranes are moist.     Pharynx: Oropharynx is clear.  Eyes:     Conjunctiva/sclera: Conjunctivae normal.  Cardiovascular:     Rate and Rhythm: Normal rate and regular rhythm.     Heart sounds: Normal heart sounds. No murmur heard. Pulmonary:     Effort: Pulmonary effort is normal.     Breath sounds: Normal breath sounds. No wheezing, rhonchi or rales.  Musculoskeletal:     Cervical back: Neck supple.  Skin:    General: Skin is warm.     Findings: No rash.   Neurological:     Mental Status: She is alert and oriented to person, place, and time.  Psychiatric:        Behavior: Behavior normal.   Previous notes and tests were reviewed. The plan was reviewed with the patient/family, and all questions/concerned were addressed.  It was my pleasure to see Judy Ramos today and participate in her care. Please feel free to contact me with any questions or concerns.  Sincerely,  Rexene Alberts, DO Allergy & Immunology  [facility] office: [place] office: [phone]

## 2021-09-02 ENCOUNTER — Encounter: Payer: Self-pay | Admitting: Allergy

## 2021-09-02 ENCOUNTER — Other Ambulatory Visit: Payer: Self-pay

## 2021-09-02 ENCOUNTER — Ambulatory Visit (INDEPENDENT_AMBULATORY_CARE_PROVIDER_SITE_OTHER): Payer: BC Managed Care – PPO | Admitting: Allergy

## 2021-09-02 VITALS — BP 118/70 | HR 76 | Temp 97.6°F | Resp 18 | Ht 65.0 in | Wt 139.0 lb

## 2021-09-02 DIAGNOSIS — M301 Polyarteritis with lung involvement [Churg-Strauss]: Secondary | ICD-10-CM | POA: Diagnosis not present

## 2021-09-02 DIAGNOSIS — D7218 Eosinophilia in diseases classified elsewhere: Secondary | ICD-10-CM | POA: Diagnosis not present

## 2021-09-02 DIAGNOSIS — J320 Chronic maxillary sinusitis: Secondary | ICD-10-CM | POA: Diagnosis not present

## 2021-09-02 DIAGNOSIS — J4530 Mild persistent asthma, uncomplicated: Secondary | ICD-10-CM

## 2021-09-02 DIAGNOSIS — J3089 Other allergic rhinitis: Secondary | ICD-10-CM

## 2021-09-02 NOTE — Assessment & Plan Note (Signed)
Past history - 2017 intradermal testing positive to dust mites only. Interim history - increased nasal congestion.   Continue environmental control measures.  Use Flonase (fluticasone) nasal spray 1 spray per nostril twice a day as needed for nasal congestion.  Nasal saline spray (i.e., Simply Saline) or nasal saline lavage (i.e., NeilMed) is recommended as needed and prior to medicated nasal sprays.

## 2021-09-02 NOTE — Assessment & Plan Note (Addendum)
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 Patient was seen by pulmonology, rheumatology, ENT, second opinion allergy at academic center in the past. Tried other EGPA medications with rheumatology with no benefit. Nucala 300mg  dosing every 4 weeks seems to work the best for her - still had issues with lower doses. She has been able to wean off oral prednisone and ICS inhaler completely. Interim history - patient's last Nucala was in July and did not take sample doses as she was not feeling any symptoms. Questions her EGPA diagnosis.   Discussed with patient that my experience with EGPA is limited and if she questions this diagnosis I recommend that she goes for further evaluation at an academic center.   Get bloodwork to look at eosinophil counts as she has been off prednisone and Nucala for 4+ months.  I'm concerned with her recent nasal congestion symptoms as it may be signs of her EGPA resurfacing.  o Start saline irrigations twice a day.  o Use Flonase (fluticasone) nasal spray 1 spray per nostril twice a day as needed for nasal congestion.  o Call Dr. Benjamine Mola (ENT) and see if he can culture the sinus discharge.   Consider restarting Nucala 100mg  every 4 weeks as a maintenance dose instead of 300mg .   Recommend getting up to date flu vaccine and Covid-19 vaccinations.

## 2021-09-02 NOTE — Patient Instructions (Addendum)
EGPA Get bloodwork We are ordering labs, so please allow 1-2 weeks for the results to come back. With the newly implemented Cures Act, the labs might be visible to you at the same time that they become visible to me. However, I will not address the results until all of the results are back, so please be patient.  In the meantime, continue recommendations in your patient instructions, including avoidance measures (if applicable), until you hear from me.  Sinus: Start saline irrigations twice a day.  Use Flonase (fluticasone) nasal spray 1 spray per nostril twice a day as needed for nasal congestion.  See if you can see Dr. Benjamine Mola for a scope/culture of the sinus mucous.  Asthma Normal breathing test today. Daily controller medication(s): none. During upper respiratory infections/asthma flares: start Flovent 163mcg 2 puffs twice a day with spacer and rinse mouth afterwards for 1-2 weeks until your breathing symptoms return to baseline.  May use albuterol rescue inhaler 2 puffs every 4 to 6 hours as needed for shortness of breath, chest tightness, coughing, and wheezing. May use albuterol rescue inhaler 2 puffs 5 to 15 minutes prior to strenuous physical activities. Monitor frequency of use.  Asthma control goals:  Full participation in all desired activities (may need albuterol before activity) Albuterol use two times or less a week on average (not counting use with activity) Cough interfering with sleep two times or less a month Oral steroids no more than once a year No hospitalizations   Follow up in 2 months or sooner if needed.

## 2021-09-02 NOTE — Assessment & Plan Note (Signed)
Well-controlled with no daily inhaler.   Today's spirometry was normal.  Daily controller medication(s): none.  During upper respiratory infections/asthma flares: start Flovent 133mcg 2 puffs twice a day with spacer and rinse mouth afterwards for 1-2 weeks until your breathing symptoms return to baseline.   May use albuterol rescue inhaler 2 puffs every 4 to 6 hours as needed for shortness of breath, chest tightness, coughing, and wheezing. May use albuterol rescue inhaler 2 puffs 5 to 15 minutes prior to strenuous physical activities. Monitor frequency of use.   Get spirometry at next visit.

## 2021-09-03 ENCOUNTER — Telehealth: Payer: Self-pay | Admitting: Allergy

## 2021-09-03 DIAGNOSIS — J343 Hypertrophy of nasal turbinates: Secondary | ICD-10-CM | POA: Diagnosis not present

## 2021-09-03 DIAGNOSIS — J310 Chronic rhinitis: Secondary | ICD-10-CM | POA: Diagnosis not present

## 2021-09-03 DIAGNOSIS — J0101 Acute recurrent maxillary sinusitis: Secondary | ICD-10-CM | POA: Diagnosis not present

## 2021-09-03 LAB — CBC WITH DIFFERENTIAL/PLATELET
Basophils Absolute: 0.1 10*3/uL (ref 0.0–0.2)
Basos: 1 %
EOS (ABSOLUTE): 0.2 10*3/uL (ref 0.0–0.4)
Eos: 3 %
Hematocrit: 40.9 % (ref 34.0–46.6)
Hemoglobin: 14 g/dL (ref 11.1–15.9)
Immature Grans (Abs): 0 10*3/uL (ref 0.0–0.1)
Immature Granulocytes: 0 %
Lymphocytes Absolute: 2.1 10*3/uL (ref 0.7–3.1)
Lymphs: 38 %
MCH: 34.1 pg — ABNORMAL HIGH (ref 26.6–33.0)
MCHC: 34.2 g/dL (ref 31.5–35.7)
MCV: 100 fL — ABNORMAL HIGH (ref 79–97)
Monocytes Absolute: 0.5 10*3/uL (ref 0.1–0.9)
Monocytes: 9 %
Neutrophils Absolute: 2.7 10*3/uL (ref 1.4–7.0)
Neutrophils: 49 %
Platelets: 288 10*3/uL (ref 150–450)
RBC: 4.1 x10E6/uL (ref 3.77–5.28)
RDW: 12.1 % (ref 11.7–15.4)
WBC: 5.6 10*3/uL (ref 3.4–10.8)

## 2021-09-03 LAB — URINALYSIS
Bilirubin, UA: NEGATIVE
Glucose, UA: NEGATIVE
Ketones, UA: NEGATIVE
Nitrite, UA: NEGATIVE
Protein,UA: NEGATIVE
RBC, UA: NEGATIVE
Specific Gravity, UA: 1.008 (ref 1.005–1.030)
Urobilinogen, Ur: 0.2 mg/dL (ref 0.2–1.0)
pH, UA: 6.5 (ref 5.0–7.5)

## 2021-09-03 LAB — COMPREHENSIVE METABOLIC PANEL
ALT: 11 IU/L (ref 0–32)
AST: 20 IU/L (ref 0–40)
Albumin/Globulin Ratio: 1.8 (ref 1.2–2.2)
Albumin: 5 g/dL — ABNORMAL HIGH (ref 3.8–4.9)
Alkaline Phosphatase: 99 IU/L (ref 44–121)
BUN/Creatinine Ratio: 14 (ref 9–23)
BUN: 13 mg/dL (ref 6–24)
Bilirubin Total: 0.5 mg/dL (ref 0.0–1.2)
CO2: 25 mmol/L (ref 20–29)
Calcium: 9.8 mg/dL (ref 8.7–10.2)
Chloride: 100 mmol/L (ref 96–106)
Creatinine, Ser: 0.91 mg/dL (ref 0.57–1.00)
Globulin, Total: 2.8 g/dL (ref 1.5–4.5)
Glucose: 87 mg/dL (ref 70–99)
Potassium: 4.4 mmol/L (ref 3.5–5.2)
Sodium: 139 mmol/L (ref 134–144)
Total Protein: 7.8 g/dL (ref 6.0–8.5)
eGFR: 73 mL/min/{1.73_m2} (ref >59)

## 2021-09-03 NOTE — Telephone Encounter (Signed)
Spoke with patient.  Blood count only showed 200 eosinophils.  It also did not show any signs of infection/inflammation. Patient has appointment with ENT today - asked patient to have them culture it and if needed okay to take antibiotics.   Kidney function, liver function, electrolytes, and urine test look normal.  Regarding the Nucala - recommend that she sees her [facility] specialist regarding the EGPA diagnosis. For now, I'm okay with holding Nucala. Follow up in 2 months.

## 2021-09-05 NOTE — Patient Instructions (Addendum)
It was good to see you again today, I will be in touch with your labs asap I would encourage vaccination for- flu, covid, shingles, pneumonia  I will watch for your thyroid ultrasound report  Please continue to do routine mammo and pap  Take care!

## 2021-09-05 NOTE — Progress Notes (Addendum)
[name] [address] [place], [place] [address] [phone]  Date:  09/07/2021   Name:  Judy Ramos   DOB:  1961/07/30   MRN:  [id number]  PCP:  Judy Mclean, MD    Chief Complaint: Annual Exam (Concerns/ questions: none/Flu shot today: declines /Mamm/ Pap: GYN- Judy Ramos/HIV screen due/PNA, Zoster due)   History of Present Illness:  Judy Ramos is a 61 y.o. 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  This is managed by [facility] rheumatology, she also sees asthma/allergy with Dr. Verlin Fester now Dr Maudie Mercury At her most recent visit her allergist did recommend that she get flu and COVID vaccinations  Most recent visit with myself was in May 2021  Married to Judy Ramos, she has 2 adult sons and enjoys walking and tennis- her boys live in [place], and they are doing well.  Her oldest is married but no grands yet  She is still playing tennis and is thinking of pickleball   GYN care per Dr. Helane Rima  COVID-19 vaccine- encouraged her to do Pneumonia vaccine-recommended due to history of asthma Shingrix- mentioned to her today, recommended  Flu vaccine- encouraged  Mammogram and Pap- per GYN  CMP, CBC done this month.  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   Asthma 01/05/2016   Hyperlipidemia 01/05/2016   Mild persistent asthma 11/13/2015   Perennial allergic rhinitis with a nonallergic component 11/10/2015   Persistent cough 11/10/2015   Esophageal reflux 11/10/2015   Chronic maxillary sinusitis 11/10/2015   History of asthma 11/10/2015   Rash 12/23/2010   IRREGULAR MENSES 01/20/2010   VARICOSE VEINS LOWER EXTREMITIES W/INFLAMMATION 10/16/2009   CERVICAL POLYP 10/16/2009   INSOMNIA 09/05/2008   Hypothyroid 10/23/2007   THYROID NODULE 02/08/2007    Past Medical History:  Diagnosis Date   Allergic rhinitis    Asthma    Gallstone    Hypothyroidism    Thyroid nodule     Past Surgical History:  Procedure Laterality Date   AUGMENTATION MAMMAPLASTY Bilateral    BREAST ENHANCEMENT SURGERY  2001   CESAREAN SECTION     x 2   CHOLECYSTECTOMY  05/1999   DILATION AND CURETTAGE OF UTERUS  2011   LASIK     uterine ablation  2011   VEIN LIGATION AND STRIPPING     VIDEO BRONCHOSCOPY Bilateral 12/10/2015   Procedure: VIDEO BRONCHOSCOPY WITHOUT FLUORO;  Surgeon: Tanda Rockers, MD;  Location: [facility];  Service: Endoscopy;  Laterality: Bilateral;    Social History   Tobacco Use   Smoking status: Never   Smokeless tobacco: Never  Vaping Use   Vaping Use: Never used  Substance Use Topics   Alcohol use: Yes    Alcohol/week: 14.0 standard drinks    Types: 14 Glasses of wine per week   Drug use: No    Family History  Problem Relation Age of Onset   Hypertension Mother    Arthritis Mother    Hypertension Father    Arthritis Father    Neuropathy Other    Lymphoma Paternal Uncle    Cancer Paternal Grandfather    Stroke Paternal Grandmother     Allergies  Allergen Reactions   Amoxil [Amoxicillin]    Imuran [Azathioprine] Rash    fever    Penicillins Rash    Medication list has been reviewed and updated.  Current Outpatient Medications on File Prior to Visit  Medication Sig Dispense Refill   Collagenase POWD 1 Scoop by Does not apply route daily.     fluticasone (FLONASE) 50 MCG/ACT nasal spray Place 1 spray into both nostrils 2 (two) times daily.     fluticasone (FLOVENT HFA) 110 MCG/ACT inhaler Inhale 2 puffs into the lungs 2 (two) times daily. (Patient taking differently: Inhale 2 puffs into the lungs as needed.) 3 Inhaler 1   levothyroxine (SYNTHROID) 50 MCG tablet Take 1 tablet (50 mcg total) by mouth daily before breakfast. Pt needs office visit for further refills 14 tablet 0   pravastatin (PRAVACHOL) 40 MG tablet TAKE 1 TABLET BY MOUTH EVERY DAY 90 tablet 1   VITAMIN D, ERGOCALCIFEROL, PO Take 1 tablet by mouth daily.     Current Facility-Administered Medications on File Prior to Visit  Medication Dose Route Frequency Provider Last Rate Last Admin   Mepolizumab SOLR 300 mg  300 mg Subcutaneous Q28 days Bobbitt, Sedalia Muta, MD   300 mg at 04/12/19 1437    Review of Systems:  As per HPI- otherwise negative.   Physical Examination: Vitals:   09/07/21 0844  BP: 118/64  Pulse: 70  Resp: 18  Temp: 98.4 F (36.9 C)  SpO2: 100%   Vitals:   09/07/21 0844  Weight: 138 lb (62.6 kg)  Height: 5\' 5"  (1.651 m)   Body mass index is 22.96 kg/m. Ideal Body Weight: Weight in (lb) to have BMI = 25: 149.9  GEN: no acute distress. Normal weight, looks well  HEENT: Atraumatic, Normocephalic.  Bilateral TM wnl, oropharynx normal.  PEERL,EOMI.  Thyroid size is generous, no nodules palpated Ears and Nose: No external deformity. CV: RRR, No M/G/R. No JVD. No thrill. No extra heart sounds. PULM: CTA B, no wheezes, crackles, rhonchi. No retractions. No resp. distress. No accessory muscle use. ABD: S, NT, ND, +BS. No rebound. No HSM. EXTR: No c/c/e PSYCH: Normally interactive. Conversant.    Assessment and  Plan: Physical exam  Hyperlipidemia, unspecified hyperlipidemia type - Plan: Lipid panel, pravastatin (PRAVACHOL) 40 MG tablet  Hypothyroidism, unspecified type - Plan: TSH, US THYROID, levothyroxine (SYNTHROID) 50 MCG tablet  Screening for diabetes mellitus - Plan: Hemoglobin A1c  Immunization due  Fatigue, unspecified type - Plan: VITAMIN D 25 Hydroxy (Vit-D Deficiency, Fractures)  Physical exam today.  Encouraged healthy diet and exercise routine Encouraged COVID-19 and flu vaccination, shingles and pneumonia Order thyroid US for her due to possible enlargement Refilled medications   Signed Lamar Blinks, MD  Received labs as below, message to patient  Results for orders placed or performed in visit on 09/07/21  Hemoglobin A1c  Result Value Ref Range   Hgb A1c MFr Bld 5.6 4.6 - 6.5 %  Lipid panel  Result  Value Ref Range   Cholesterol 217 (H) 0 - 200 mg/dL   Triglycerides 108.0 0.0 - 149.0 mg/dL   HDL 91.90 >39.00 mg/dL   VLDL 21.6 0.0 - 40.0 mg/dL   LDL Cholesterol 103 (H) 0 - 99 mg/dL   Total CHOL/HDL Ratio 2    NonHDL 124.75   TSH  Result Value Ref Range   TSH 1.36 0.35 - 5.50 uIU/mL  VITAMIN D 25 Hydroxy (Vit-D Deficiency, Fractures)  Result Value Ref Range   VITD 49.20 30.00 - 100.00 ng/mL

## 2021-09-07 ENCOUNTER — Encounter: Payer: Self-pay | Admitting: Family Medicine

## 2021-09-07 ENCOUNTER — Ambulatory Visit (INDEPENDENT_AMBULATORY_CARE_PROVIDER_SITE_OTHER): Payer: BLUE CROSS/BLUE SHIELD | Admitting: Family Medicine

## 2021-09-07 ENCOUNTER — Encounter: Payer: Self-pay | Admitting: Allergy

## 2021-09-07 VITALS — BP 118/64 | HR 70 | Temp 98.4°F | Resp 18 | Ht 65.0 in | Wt 138.0 lb

## 2021-09-07 DIAGNOSIS — E785 Hyperlipidemia, unspecified: Secondary | ICD-10-CM

## 2021-09-07 DIAGNOSIS — E039 Hypothyroidism, unspecified: Secondary | ICD-10-CM | POA: Diagnosis not present

## 2021-09-07 DIAGNOSIS — Z131 Encounter for screening for diabetes mellitus: Secondary | ICD-10-CM

## 2021-09-07 DIAGNOSIS — Z23 Encounter for immunization: Secondary | ICD-10-CM

## 2021-09-07 DIAGNOSIS — R5383 Other fatigue: Secondary | ICD-10-CM | POA: Diagnosis not present

## 2021-09-07 DIAGNOSIS — Z0000 Encounter for general adult medical examination without abnormal findings: Secondary | ICD-10-CM

## 2021-09-07 LAB — HEMOGLOBIN A1C: Hgb A1c MFr Bld: 5.6 % (ref 4.6–6.5)

## 2021-09-07 LAB — VITAMIN D 25 HYDROXY (VIT D DEFICIENCY, FRACTURES): VITD: 49.2 ng/mL (ref 30.00–100.00)

## 2021-09-07 LAB — LIPID PANEL
Cholesterol: 217 mg/dL — ABNORMAL HIGH (ref 0–200)
HDL: 91.9 mg/dL (ref >39.00)
LDL Cholesterol: 103 mg/dL — ABNORMAL HIGH (ref 0–99)
NonHDL: 124.75
Total CHOL/HDL Ratio: 2
Triglycerides: 108 mg/dL (ref 0.0–149.0)
VLDL: 21.6 mg/dL (ref 0.0–40.0)

## 2021-09-07 LAB — TSH: TSH: 1.36 u[IU]/mL (ref 0.35–5.50)

## 2021-09-07 MED ORDER — LEVOTHYROXINE SODIUM 50 MCG PO TABS: 50.0000 ug | ORAL_TABLET | Freq: Every day | ORAL | 3 refills | Status: DC

## 2021-09-07 MED ORDER — PRAVASTATIN SODIUM 40 MG PO TABS: 40.0000 mg | ORAL_TABLET | Freq: Every day | ORAL | 3 refills | Status: DC

## 2021-09-07 NOTE — Progress Notes (Signed)
Reviewed notes from Dr. Benjamine Mola. Date of service: 1/5/20223. See scanned notes for full documentation. Bilateral recurrent acute rhinosinusitis - levo 500mg  x 14 days and prednisone.

## 2021-09-08 ENCOUNTER — Encounter: Payer: Self-pay | Admitting: Family Medicine

## 2021-09-08 ENCOUNTER — Other Ambulatory Visit: Payer: Self-pay

## 2021-09-08 ENCOUNTER — Ambulatory Visit (HOSPITAL_BASED_OUTPATIENT_CLINIC_OR_DEPARTMENT_OTHER)
Admission: RE | Admit: 2021-09-08 | Discharge: 2021-09-08 | Disposition: A | Discharge status: Home / Self Care | Payer: BC Managed Care – PPO | Source: Ambulatory Visit | Attending: Family Medicine | Admitting: Family Medicine

## 2021-09-08 DIAGNOSIS — E039 Hypothyroidism, unspecified: Secondary | ICD-10-CM | POA: Insufficient documentation

## 2021-09-08 DIAGNOSIS — E041 Nontoxic single thyroid nodule: Secondary | ICD-10-CM | POA: Diagnosis not present

## 2021-09-08 DIAGNOSIS — E042 Nontoxic multinodular goiter: Secondary | ICD-10-CM | POA: Diagnosis not present

## 2021-09-24 DIAGNOSIS — J343 Hypertrophy of nasal turbinates: Secondary | ICD-10-CM | POA: Diagnosis not present

## 2021-09-24 DIAGNOSIS — J0101 Acute recurrent maxillary sinusitis: Secondary | ICD-10-CM | POA: Diagnosis not present

## 2021-09-24 DIAGNOSIS — J310 Chronic rhinitis: Secondary | ICD-10-CM | POA: Diagnosis not present

## 2021-09-30 ENCOUNTER — Encounter: Payer: Self-pay | Admitting: Allergy

## 2021-09-30 NOTE — Progress Notes (Signed)
Reviewed notes from Dr. Benjamine Mola. Date of service: 09/24/2021. See scanned notes for full documentation. Still congested after antibiotic and steroids.  Getting CT scan of the sinuses.

## 2021-10-01 ENCOUNTER — Other Ambulatory Visit: Payer: Self-pay | Admitting: Otolaryngology

## 2021-10-01 DIAGNOSIS — J329 Chronic sinusitis, unspecified: Secondary | ICD-10-CM

## 2021-10-07 ENCOUNTER — Ambulatory Visit
Admission: RE | Admit: 2021-10-07 | Discharge: 2021-10-07 | Disposition: A | Discharge status: Home / Self Care | Payer: BC Managed Care – PPO | Source: Ambulatory Visit | Attending: Otolaryngology | Admitting: Otolaryngology

## 2021-10-07 DIAGNOSIS — J329 Chronic sinusitis, unspecified: Secondary | ICD-10-CM

## 2021-10-07 DIAGNOSIS — J3489 Other specified disorders of nose and nasal sinuses: Secondary | ICD-10-CM | POA: Diagnosis not present

## 2021-10-07 DIAGNOSIS — J342 Deviated nasal septum: Secondary | ICD-10-CM | POA: Diagnosis not present

## 2021-10-14 ENCOUNTER — Encounter: Payer: Self-pay | Admitting: Allergy

## 2021-10-14 DIAGNOSIS — J338 Other polyp of sinus: Secondary | ICD-10-CM | POA: Diagnosis not present

## 2021-10-14 DIAGNOSIS — J324 Chronic pansinusitis: Secondary | ICD-10-CM | POA: Diagnosis not present

## 2021-11-19 ENCOUNTER — Telehealth: Payer: Self-pay

## 2021-11-19 DIAGNOSIS — J338 Other polyp of sinus: Secondary | ICD-10-CM | POA: Diagnosis not present

## 2021-11-19 DIAGNOSIS — J324 Chronic pansinusitis: Secondary | ICD-10-CM | POA: Diagnosis not present

## 2021-11-19 NOTE — Telephone Encounter (Signed)
Called and left a voicemail asking for patient to return call to discuss.  °

## 2021-11-19 NOTE — Telephone Encounter (Signed)
Patient called stating she is having sinus surgery to remove polyps on 12/03/2021 with Dr Benjamine Mola. She states he is wanting to do a biopsy during the surgery for Churg Strauss Syndrome. She states he is wondering how he should go about ordering this to have it done? Dr Maudie Mercury do you have any recommendations?  ? ? ?

## 2021-11-19 NOTE — Telephone Encounter (Signed)
Please call patient back.  ? ?I'm not familiar with what exactly to order but usually looking for eosinophils in the tissue for EGPA. ? ?ENT should be more familiar as they send out tissue samples. ? ?

## 2021-11-20 NOTE — Telephone Encounter (Signed)
Informed of Dr. Julianne Rice message.  ?

## 2021-11-25 ENCOUNTER — Ambulatory Visit: Payer: BC Managed Care – PPO | Admitting: Allergy

## 2021-12-03 ENCOUNTER — Other Ambulatory Visit (INDEPENDENT_AMBULATORY_CARE_PROVIDER_SITE_OTHER): Payer: Self-pay | Admitting: Otolaryngology

## 2021-12-03 DIAGNOSIS — J324 Chronic pansinusitis: Secondary | ICD-10-CM | POA: Diagnosis not present

## 2021-12-03 DIAGNOSIS — J338 Other polyp of sinus: Secondary | ICD-10-CM | POA: Diagnosis not present

## 2021-12-03 DIAGNOSIS — J328 Other chronic sinusitis: Secondary | ICD-10-CM | POA: Diagnosis not present

## 2021-12-03 DIAGNOSIS — J320 Chronic maxillary sinusitis: Secondary | ICD-10-CM | POA: Diagnosis not present

## 2021-12-03 DIAGNOSIS — J329 Chronic sinusitis, unspecified: Secondary | ICD-10-CM | POA: Diagnosis not present

## 2021-12-03 DIAGNOSIS — J322 Chronic ethmoidal sinusitis: Secondary | ICD-10-CM | POA: Diagnosis not present

## 2021-12-03 DIAGNOSIS — J321 Chronic frontal sinusitis: Secondary | ICD-10-CM | POA: Diagnosis not present

## 2021-12-03 DIAGNOSIS — J323 Chronic sphenoidal sinusitis: Secondary | ICD-10-CM | POA: Diagnosis not present

## 2021-12-03 HISTORY — PX: SINUS IRRIGATION: SHX2411

## 2021-12-10 DIAGNOSIS — J338 Other polyp of sinus: Secondary | ICD-10-CM | POA: Diagnosis not present

## 2021-12-10 DIAGNOSIS — J324 Chronic pansinusitis: Secondary | ICD-10-CM | POA: Diagnosis not present

## 2021-12-26 NOTE — Patient Instructions (Addendum)
EGPA ?Recommend seeing [facility] regarding EGPA diagnosis ?Continue to consider restarting Nucala injections ? ?Perennial allergic rhinitis ( 2017 skin test positive by intradermal to dust mite) ?Continue saline irrigations once a day as per Dr. Benjamine Mola ?Continue to hold Flonase (fluticasone) nasal spray until ok with Dr. Benjamine Mola ?Continue to follow up with Dr. Benjamine Mola (s/p sinus surgery 12/03/21) ?Continue to consider Dupixent injections if your nasal polyps return ? ?Asthma ?Daily controller medication(s): none. ?During upper respiratory infections/asthma flares: start Flovent 14mg 2 puffs twice a day with spacer and rinse mouth afterwards for 1-2 weeks until your breathing symptoms return to baseline.  ?May use albuterol rescue inhaler 2 puffs every 4 to 6 hours as needed for shortness of breath, chest tightness, coughing, and wheezing. May use albuterol rescue inhaler 2 puffs 5 to 15 minutes prior to strenuous physical activities. Monitor frequency of use.  ?Asthma control goals:  ?Full participation in all desired activities (may need albuterol before activity) ?Albuterol use two times or less a week on average (not counting use with activity) ?Cough interfering with sleep two times or less a month ?Oral steroids no more than once a year ?No hospitalizations  ? ?Follow up in 2-3 months or sooner if needed. ?

## 2021-12-28 ENCOUNTER — Ambulatory Visit (INDEPENDENT_AMBULATORY_CARE_PROVIDER_SITE_OTHER): Payer: BC Managed Care – PPO | Admitting: Family

## 2021-12-28 ENCOUNTER — Encounter: Payer: Self-pay | Admitting: Family

## 2021-12-28 VITALS — BP 122/60 | HR 72 | Temp 98.0°F | Resp 16 | Ht 65.0 in | Wt 137.6 lb

## 2021-12-28 DIAGNOSIS — J3089 Other allergic rhinitis: Secondary | ICD-10-CM

## 2021-12-28 DIAGNOSIS — D7218 Eosinophilia in diseases classified elsewhere: Secondary | ICD-10-CM | POA: Diagnosis not present

## 2021-12-28 DIAGNOSIS — M301 Polyarteritis with lung involvement [Churg-Strauss]: Secondary | ICD-10-CM | POA: Diagnosis not present

## 2021-12-28 DIAGNOSIS — J4530 Mild persistent asthma, uncomplicated: Secondary | ICD-10-CM

## 2021-12-28 MED ORDER — ALBUTEROL SULFATE HFA 108 (90 BASE) MCG/ACT IN AERS: 2.0000 | INHALATION_SPRAY | Freq: Four times a day (QID) | RESPIRATORY_TRACT | 1 refills | Status: DC | PRN

## 2021-12-28 MED ORDER — FLUTICASONE PROPIONATE HFA 110 MCG/ACT IN AERO: INHALATION_SPRAY | RESPIRATORY_TRACT | 2 refills | Status: DC

## 2021-12-28 NOTE — Progress Notes (Signed)
? ?[place] [address] ?Dept: [phone] ? ?FOLLOW UP NOTE ? ?Patient ID: Judy Ramos, female    DOB: 29-Dec-1960  Age: 61 y.o. MRN: [id number] ?Date of Office Visit: 12/28/2021 ? ?Assessment  ?Chief Complaint: Follow-up (Not having any issues right now. Just had sinus surgery. No activity on the eosinophils levels.) ? ?HPI ?Judy Ramos is a 61 year old female who presents today for follow-up of EGPA, mild persistent asthma, and perennial allergic rhinitis.  Since her last office visit she denies any new diagnosis, but reports that she had sinus surgery on December 03, 2021 by Dr. Benjamine Mola. ? ?She continues to question whether she has EGPA.  She reports that they never really truly did a biopsy when they did the bronchoscopy.  She reports that her husband lost her job and she got off track with her Nucala injections.  After being off the Nucala injections for 4 months she felt good.  She would like to hold off on restarting Nucala injections because her most recent blood work with Dr. Maudie Mercury did not show elevated eosinophils.  She reports that when Dr. Benjamine Mola did her biopsy from her sinus surgery that he did not see any eosinophils.  I do not have results showing this.  She is currently using saline rinses once a day and is not using Flonase nasal spray per Dr. Deeann Saint recommendation.  She reports that she has a follow-up with him next week.  She mentions that since having sinus surgery that she has been having headaches. Before having sinus surgery she was not having headaches.  Recommended that she call Dr. Deeann Saint office to discuss this.  She is still considering starting Dupixent injections for her nasal polyps but wanted to start with a clean slate to see if she even needed to use Dupixent.  She reports that she is starting to taste and smell. ? ?Mild persistent asthma is reported as controlled.  She reports that she is currently out of albuterol and Flovent 110 mcg for upper respiratory  infection/asthma flares.  She denies cough, wheeze, tightness in chest, shortness of breath, and nocturnal awakenings due to breathing problems.  Since her last office visit she has not required any systemic steroids or made any trips to the emergency room or urgent care due to breathing problems.  She reports that before she did her sinus surgery back in February /March she brought over-the-counter Primatene due to the drainage going down the back of her throat.  Instructed her to stop the Primatene Mist and that we would refill her albuterol and Flovent 110 mcg to be used for upper respiratory infection/asthma flares. ? ?Perennial allergic rhinitis is reported as controlled with saline rinses once a day.  She reports seeing pieces of blood with saline rinse use since her sinus surgery.  She denies rhinorrhea, nasal congestion, and postnasal drip.  She has not had any sinus infections since we last saw her. ? ? ? ? ?Drug Allergies:  ?Allergies  ?Allergen Reactions  ? Amoxil [Amoxicillin]   ? Imuran [Azathioprine] Rash  ?  fever  ? Penicillins Rash  ? ? ?Review of Systems: ?Review of Systems  ?Constitutional:  Negative for chills and fever.  ?HENT:    ?     Reports seeing blood pieces from her sinus surgery when she does her sinus rinse once a day.  She also reports that she is now starting to be able to taste and smell.  Denies rhinorrhea, nasal congestion, and postnasal drip  ?  Eyes:   ?     Denies itchy watery eyes  ?Respiratory:  Negative for cough, shortness of breath and wheezing.   ?     Denies cough, wheeze, tightness in chest, shortness of breath, and nocturnal awakenings due to breathing problems  ?Cardiovascular:  Negative for chest pain and palpitations.  ?Gastrointestinal:   ?     Denies heartburn or reflux symptoms  ?Genitourinary:  Negative for frequency.  ?Skin:  Negative for itching and rash.  ?Neurological:  Positive for headaches.  ?     Reports headaches every now and then since having sinus  surgery.  She is going to call Dr. Deeann Saint office  ?Endo/Heme/Allergies:  Positive for environmental allergies.  ? ? ?Physical Exam: ?BP 122/60   Pulse 72   Temp 98 ?F (36.7 ?C) (Temporal)   Resp 16   Ht '5\' 5"'$  (1.651 m)   Wt 137 lb 9.6 oz (62.4 kg)   LMP 08/23/2016   SpO2 99%   BMI 22.90 kg/m?   ? ?Physical Exam ?Constitutional:   ?   Appearance: Normal appearance.  ?HENT:  ?   Head: Normocephalic and atraumatic.  ?   Comments: Pharynx normal, eyes normal, ears normal, nose normal ?   Right Ear: Tympanic membrane, ear canal and external ear normal.  ?   Left Ear: Tympanic membrane, ear canal and external ear normal.  ?   Nose: Nose normal.  ?   Mouth/Throat:  ?   Mouth: Mucous membranes are moist.  ?   Pharynx: Oropharynx is clear.  ?Eyes:  ?   Conjunctiva/sclera: Conjunctivae normal.  ?Cardiovascular:  ?   Rate and Rhythm: Normal rate and regular rhythm.  ?   Heart sounds: Normal heart sounds.  ?Pulmonary:  ?   Effort: Pulmonary effort is normal.  ?   Breath sounds: Normal breath sounds.  ?   Comments: Lungs clear to auscultation ?Musculoskeletal:  ?   Cervical back: Neck supple.  ?Skin: ?   General: Skin is warm.  ?Neurological:  ?   Mental Status: She is alert and oriented to person, place, and time.  ?Psychiatric:     ?   Mood and Affect: Mood normal.     ?   Behavior: Behavior normal.     ?   Thought Content: Thought content normal.     ?   Judgment: Judgment normal.  ? ? ?Diagnostics: ?FVC 3.28 L (98%), FEV1 2.36 L (90%).  Predicted FVC 3.33 L, predicted FEV1 2.62 L.  Spirometry indicates normal respiratory function ? ?Assessment and Plan: ?1. EGPA   ?2. Mild persistent asthma, unspecified whether complicated   ?3. Perennial allergic rhinitis   ? ? ?Meds ordered this encounter  ?Medications  ? fluticasone (FLOVENT HFA) 110 MCG/ACT inhaler  ?  Sig: For asthma flares/upper respiratory infection start Flovent 110 mcg 2 puffs twice a day with spacer for 1 to 2 weeks or until symptoms return to baseline  ?   Dispense:  1 each  ?  Refill:  2  ? albuterol (VENTOLIN HFA) 108 (90 Base) MCG/ACT inhaler  ?  Sig: Inhale 2 puffs into the lungs every 6 (six) hours as needed for wheezing or shortness of breath.  ?  Dispense:  18 g  ?  Refill:  1  ? ? ?Patient Instructions  ?EGPA ?Recommend seeing [facility] regarding EGPA diagnosis ?Continue to consider restarting Nucala injections ? ?Perennial allergic rhinitis ( 2017 skin test positive by intradermal to dust  mite) ?Continue saline irrigations once a day as per Dr. Benjamine Mola ?Continue to hold Flonase (fluticasone) nasal spray until ok with Dr. Benjamine Mola ?Continue to follow up with Dr. Benjamine Mola (s/p sinus surgery 12/03/21) ?Continue to consider Dupixent injections if your nasal polyps return ? ?Asthma ?Daily controller medication(s): none. ?During upper respiratory infections/asthma flares: start Flovent 143mg 2 puffs twice a day with spacer and rinse mouth afterwards for 1-2 weeks until your breathing symptoms return to baseline.  ?May use albuterol rescue inhaler 2 puffs every 4 to 6 hours as needed for shortness of breath, chest tightness, coughing, and wheezing. May use albuterol rescue inhaler 2 puffs 5 to 15 minutes prior to strenuous physical activities. Monitor frequency of use.  ?Asthma control goals:  ?Full participation in all desired activities (may need albuterol before activity) ?Albuterol use two times or less a week on average (not counting use with activity) ?Cough interfering with sleep two times or less a month ?Oral steroids no more than once a year ?No hospitalizations  ? ?Follow up in 2-3 months or sooner if needed. ?Return in about 3 months (around 03/30/2022), or if symptoms worsen or fail to improve. ?  ? ?Thank you for the opportunity to care for this patient.  Please do not hesitate to contact me with questions. ? ?CAlthea Charon FNP ?[facility]? ? ? ? ?

## 2021-12-31 DIAGNOSIS — J338 Other polyp of sinus: Secondary | ICD-10-CM | POA: Diagnosis not present

## 2021-12-31 DIAGNOSIS — J324 Chronic pansinusitis: Secondary | ICD-10-CM | POA: Diagnosis not present

## 2022-01-18 DIAGNOSIS — D2271 Melanocytic nevi of right lower limb, including hip: Secondary | ICD-10-CM | POA: Diagnosis not present

## 2022-01-18 DIAGNOSIS — D2372 Other benign neoplasm of skin of left lower limb, including hip: Secondary | ICD-10-CM | POA: Diagnosis not present

## 2022-01-18 DIAGNOSIS — D225 Melanocytic nevi of trunk: Secondary | ICD-10-CM | POA: Diagnosis not present

## 2022-01-18 DIAGNOSIS — D2272 Melanocytic nevi of left lower limb, including hip: Secondary | ICD-10-CM | POA: Diagnosis not present

## 2022-01-21 DIAGNOSIS — J324 Chronic pansinusitis: Secondary | ICD-10-CM | POA: Diagnosis not present

## 2022-01-21 DIAGNOSIS — J338 Other polyp of sinus: Secondary | ICD-10-CM | POA: Diagnosis not present

## 2022-04-05 ENCOUNTER — Ambulatory Visit: Payer: BC Managed Care – PPO | Admitting: Family

## 2022-04-22 DIAGNOSIS — J338 Other polyp of sinus: Secondary | ICD-10-CM | POA: Diagnosis not present

## 2022-04-22 DIAGNOSIS — J324 Chronic pansinusitis: Secondary | ICD-10-CM | POA: Diagnosis not present

## 2022-04-26 ENCOUNTER — Encounter: Payer: Self-pay | Admitting: Allergy

## 2022-04-26 NOTE — Progress Notes (Signed)
Reviewed notes from Dr. Benjamine Mola. Date of service: 04/22/22. See scanned notes for full documentation. Follow up post sinus surgery in April 2023 - exam unremarkable.

## 2022-05-19 DIAGNOSIS — R0982 Postnasal drip: Secondary | ICD-10-CM | POA: Diagnosis not present

## 2022-05-19 DIAGNOSIS — J0131 Acute recurrent sphenoidal sinusitis: Secondary | ICD-10-CM | POA: Diagnosis not present

## 2022-05-19 DIAGNOSIS — J343 Hypertrophy of nasal turbinates: Secondary | ICD-10-CM | POA: Diagnosis not present

## 2022-06-18 DIAGNOSIS — J343 Hypertrophy of nasal turbinates: Secondary | ICD-10-CM | POA: Diagnosis not present

## 2022-06-18 DIAGNOSIS — J0131 Acute recurrent sphenoidal sinusitis: Secondary | ICD-10-CM | POA: Diagnosis not present

## 2022-08-11 DIAGNOSIS — Z124 Encounter for screening for malignant neoplasm of cervix: Secondary | ICD-10-CM | POA: Diagnosis not present

## 2022-08-11 DIAGNOSIS — Z1231 Encounter for screening mammogram for malignant neoplasm of breast: Secondary | ICD-10-CM | POA: Diagnosis not present

## 2022-08-11 DIAGNOSIS — Z6822 Body mass index (BMI) 22.0-22.9, adult: Secondary | ICD-10-CM | POA: Diagnosis not present

## 2022-08-11 DIAGNOSIS — Z01419 Encounter for gynecological examination (general) (routine) without abnormal findings: Secondary | ICD-10-CM | POA: Diagnosis not present

## 2022-08-11 LAB — HM PAP SMEAR: HM Pap smear: NEGATIVE

## 2022-08-11 LAB — HM MAMMOGRAPHY

## 2022-09-05 ENCOUNTER — Other Ambulatory Visit: Payer: Self-pay | Admitting: Family Medicine

## 2022-09-05 DIAGNOSIS — E039 Hypothyroidism, unspecified: Secondary | ICD-10-CM

## 2022-09-06 ENCOUNTER — Encounter: Payer: Self-pay | Admitting: *Deleted

## 2022-09-26 NOTE — Progress Notes (Unsigned)
[place] at [facility] [address], [place], [place] [address] [phone]  Date:  09/29/2022   Name:  Judy Ramos   DOB:  September 13, 1960   MRN:  [id number]  PCP:  Darreld Mclean, MD    Chief Complaint: No chief complaint on file.   History of Present Illness:  Judy Ramos is a 62 y.o. very pleasant female patient who presents with the following:  Patient seen today for physical- history of moderate persistent asthma, reflux, hypothyroidism, dyslipidemia, Churg Strass syndrome, prednisone related osteopenia   Most recent visit with myself about 1 year ago Her asthma is thought related to her ChurgAltamese Cabal syndrome.  This is managed by [facility] rheumatology, she also sees asthma/allergy with Dr. Verlin Fester now Dr Maudie Mercury   Married to [place], 2 adult sons living in [place].  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   Asthma 01/05/2016   Hyperlipidemia 01/05/2016   Mild persistent asthma 11/13/2015   Perennial allergic rhinitis with a nonallergic component 11/10/2015   Persistent cough 11/10/2015   Esophageal reflux 11/10/2015   Chronic maxillary sinusitis 11/10/2015   History of asthma 11/10/2015   Rash 12/23/2010   IRREGULAR MENSES 01/20/2010   VARICOSE VEINS LOWER EXTREMITIES W/INFLAMMATION 10/16/2009   CERVICAL POLYP 10/16/2009   INSOMNIA 09/05/2008   Hypothyroid 10/23/2007   THYROID NODULE 02/08/2007    Past Medical History:  Diagnosis Date   Allergic rhinitis    Asthma    Gallstone    Hypothyroidism    Thyroid nodule     Past  Surgical History:  Procedure Laterality Date   AUGMENTATION MAMMAPLASTY Bilateral    BREAST ENHANCEMENT SURGERY  2001   CESAREAN SECTION     x 2   CHOLECYSTECTOMY  05/1999   DILATION AND CURETTAGE OF UTERUS  2011   LASIK     SINUS IRRIGATION Bilateral 12/03/2021   Got sinus cleaned out (polps and fungus balls)   uterine ablation  2011   VEIN LIGATION AND STRIPPING     VIDEO BRONCHOSCOPY Bilateral 12/10/2015   Procedure: VIDEO BRONCHOSCOPY WITHOUT FLUORO;  Surgeon: Tanda Rockers, MD;  Location: [facility];  Service: Endoscopy;  Laterality: Bilateral;    Social History   Tobacco Use   Smoking status: Never   Smokeless tobacco: Never  Vaping Use   Vaping Use: Never used  Substance Use Topics   Alcohol use: Yes    Alcohol/week: 14.0 standard drinks of alcohol    Types: 14 Glasses of wine per week   Drug use: No    Family History  Problem Relation Age of Onset   Hypertension Mother    Arthritis Mother    Hypertension Father    Arthritis Father    Neuropathy Other    Lymphoma Paternal Uncle    Cancer Paternal Grandfather    Stroke Paternal Grandmother     Allergies  Allergen Reactions   Amoxil [Amoxicillin]    Imuran [Azathioprine] Rash    fever   Penicillins Rash    Medication list has been reviewed and updated.  Current Outpatient Medications on File Prior  to Visit  Medication Sig Dispense Refill   albuterol (VENTOLIN HFA) 108 (90 Base) MCG/ACT inhaler Inhale 2 puffs into the lungs every 6 (six) hours as needed for wheezing or shortness of breath. 18 g 1   Ascorbic Acid (VITAMIN C) 500 MG CAPS      Collagenase POWD 1 Scoop by Does not apply route daily.     fluticasone (FLONASE) 50 MCG/ACT nasal spray Place 1 spray into both nostrils 2 (two) times daily. (Patient not taking: Reported on 12/28/2021)     fluticasone (FLOVENT HFA) 110 MCG/ACT inhaler For asthma flares/upper respiratory infection start Flovent 110 mcg 2 puffs twice a day with spacer for 1 to 2  weeks or until symptoms return to baseline 1 each 2   IMVEXXY MAINTENANCE PACK 10 MCG INST SMARTSIG:1 insert Vaginal Twice a Week     levothyroxine (SYNTHROID) 50 MCG tablet TAKE 1 TABLET BY MOUTH DAILY BEFORE BREAKFAST 90 tablet 0   pravastatin (PRAVACHOL) 40 MG tablet Take 1 tablet (40 mg total) by mouth daily. 90 tablet 3   Vitamin A 2400 MCG (8000 UT) CAPS      vitamin B-12 (CYANOCOBALAMIN) 500 MCG tablet      VITAMIN D, ERGOCALCIFEROL, PO Take 1 tablet by mouth daily.     Current Facility-Administered Medications on File Prior to Visit  Medication Dose Route Frequency Provider Last Rate Last Admin   Mepolizumab SOLR 300 mg  300 mg Subcutaneous Q28 days Bobbitt, Sedalia Muta, MD   300 mg at 04/12/19 1437    Review of Systems:  As per HPI- otherwise negative.   Physical Examination: There were no vitals filed for this visit. There were no vitals filed for this visit. There is no height or weight on file to calculate BMI. Ideal Body Weight:    GEN: no acute distress. HEENT: Atraumatic, Normocephalic.  Ears and Nose: No external deformity. CV: RRR, No M/G/R. No JVD. No thrill. No extra heart sounds. PULM: CTA B, no wheezes, crackles, rhonchi. No retractions. No resp. distress. No accessory muscle use. ABD: S, NT, ND, +BS. No rebound. No HSM. EXTR: No c/c/e PSYCH: Normally interactive. Conversant.    Assessment and Plan: *** Physical exam today.  Encouraged healthy diet and exercise routine Will plan further follow- up pending labs.  Signed Lamar Blinks, MD

## 2022-09-26 NOTE — Patient Instructions (Signed)
It was great to see again today, I will be in touch with your labs soon as possible Please stop by the lab and then imaging on the ground floor to schedule/do your bone density and coronary calcium We will get you set up with podiatry to check out your feet! Recommend covid, flu, shingles vaccination

## 2022-09-29 ENCOUNTER — Ambulatory Visit (INDEPENDENT_AMBULATORY_CARE_PROVIDER_SITE_OTHER): Payer: BC Managed Care – PPO | Admitting: Family Medicine

## 2022-09-29 ENCOUNTER — Encounter: Payer: Self-pay | Admitting: Family Medicine

## 2022-09-29 VITALS — BP 122/72 | HR 64 | Temp 97.4°F | Resp 18 | Ht 65.0 in | Wt 133.8 lb

## 2022-09-29 DIAGNOSIS — Z0000 Encounter for general adult medical examination without abnormal findings: Secondary | ICD-10-CM

## 2022-09-29 DIAGNOSIS — E039 Hypothyroidism, unspecified: Secondary | ICD-10-CM

## 2022-09-29 DIAGNOSIS — G8929 Other chronic pain: Secondary | ICD-10-CM

## 2022-09-29 DIAGNOSIS — M79673 Pain in unspecified foot: Secondary | ICD-10-CM

## 2022-09-29 DIAGNOSIS — Z130 Encounter for screening for diseases of the blood and blood-forming organs and certain disorders involving the immune mechanism: Secondary | ICD-10-CM

## 2022-09-29 DIAGNOSIS — Z131 Encounter for screening for diabetes mellitus: Secondary | ICD-10-CM | POA: Diagnosis not present

## 2022-09-29 DIAGNOSIS — E785 Hyperlipidemia, unspecified: Secondary | ICD-10-CM

## 2022-09-29 DIAGNOSIS — E2839 Other primary ovarian failure: Secondary | ICD-10-CM

## 2022-09-29 LAB — COMPREHENSIVE METABOLIC PANEL
ALT: 11 U/L (ref 0–35)
AST: 19 U/L (ref 0–37)
Albumin: 4.6 g/dL (ref 3.5–5.2)
Alkaline Phosphatase: 99 U/L (ref 39–117)
BUN: 14 mg/dL (ref 6–23)
CO2: 29 mEq/L (ref 19–32)
Calcium: 9.5 mg/dL (ref 8.4–10.5)
Chloride: 99 mEq/L (ref 96–112)
Creatinine, Ser: 0.78 mg/dL (ref 0.40–1.20)
GFR: 83.2 mL/min (ref >60.00)
Glucose, Bld: 92 mg/dL (ref 70–99)
Potassium: 4.2 mEq/L (ref 3.5–5.1)
Sodium: 136 mEq/L (ref 135–145)
Total Bilirubin: 0.7 mg/dL (ref 0.2–1.2)
Total Protein: 7.3 g/dL (ref 6.0–8.3)

## 2022-09-29 LAB — CBC
HCT: 38.8 % (ref 36.0–46.0)
Hemoglobin: 13.1 g/dL (ref 12.0–15.0)
MCHC: 33.9 g/dL (ref 30.0–36.0)
MCV: 100.9 fl — ABNORMAL HIGH (ref 78.0–100.0)
Platelets: 294 10*3/uL (ref 150.0–400.0)
RBC: 3.84 Mil/uL — ABNORMAL LOW (ref 3.87–5.11)
RDW: 13.3 % (ref 11.5–15.5)
WBC: 5.4 10*3/uL (ref 4.0–10.5)

## 2022-09-29 LAB — HEMOGLOBIN A1C: Hgb A1c MFr Bld: 5.6 % (ref 4.6–6.5)

## 2022-09-29 LAB — LIPID PANEL
Cholesterol: 195 mg/dL (ref 0–200)
HDL: 60.2 mg/dL (ref >39.00)
LDL Cholesterol: 119 mg/dL — ABNORMAL HIGH (ref 0–99)
NonHDL: 134.68
Total CHOL/HDL Ratio: 3
Triglycerides: 77 mg/dL (ref 0.0–149.0)
VLDL: 15.4 mg/dL (ref 0.0–40.0)

## 2022-09-29 LAB — TSH: TSH: 0.96 u[IU]/mL (ref 0.35–5.50)

## 2022-10-04 ENCOUNTER — Ambulatory Visit (INDEPENDENT_AMBULATORY_CARE_PROVIDER_SITE_OTHER): Payer: BC Managed Care – PPO

## 2022-10-04 ENCOUNTER — Ambulatory Visit (INDEPENDENT_AMBULATORY_CARE_PROVIDER_SITE_OTHER): Payer: BC Managed Care – PPO | Admitting: Podiatry

## 2022-10-04 DIAGNOSIS — M79672 Pain in left foot: Secondary | ICD-10-CM

## 2022-10-04 DIAGNOSIS — M79671 Pain in right foot: Secondary | ICD-10-CM

## 2022-10-04 DIAGNOSIS — G5763 Lesion of plantar nerve, bilateral lower limbs: Secondary | ICD-10-CM | POA: Diagnosis not present

## 2022-10-04 NOTE — Progress Notes (Signed)
   Chief Complaint  Patient presents with   Neuroma    Patient came in today for pain on the bottom of her feet " feels like walking on bunched up socks" and pain on the top of the foot, started 3 weeks ago, X-rays taken today     HPI: 62 y.o. female presenting today as a new patient for evaluation of tenderness and symptoms to the bilateral forefoot that began about 2.5-3 months ago.  Idiopathic onset.  She denies any change in shoe gear or activity.  Patient states that she feels like there are socks bunched up underneath her toes.  She presents for further treatment and evaluation  Past Medical History:  Diagnosis Date   Allergic rhinitis    Asthma    Gallstone    Hypothyroidism    Thyroid nodule     Past Surgical History:  Procedure Laterality Date   AUGMENTATION MAMMAPLASTY Bilateral    BREAST ENHANCEMENT SURGERY  2001   CESAREAN SECTION     x 2   CHOLECYSTECTOMY  05/1999   DILATION AND CURETTAGE OF UTERUS  2011   LASIK     SINUS IRRIGATION Bilateral 12/03/2021   Got sinus cleaned out (polps and fungus balls)   uterine ablation  2011   VEIN LIGATION AND STRIPPING     VIDEO BRONCHOSCOPY Bilateral 12/10/2015   Procedure: VIDEO BRONCHOSCOPY WITHOUT FLUORO;  Surgeon: Tanda Rockers, MD;  Location: [facility];  Service: Endoscopy;  Laterality: Bilateral;    Allergies  Allergen Reactions   Amoxil [Amoxicillin]    Imuran [Azathioprine] Rash    fever   Penicillins Rash     Physical Exam: General: The patient is alert and oriented x3 in no acute distress.  Dermatology: Skin is warm, dry and supple bilateral lower extremities. Negative for open lesions or macerations.  Vascular: Palpable pedal pulses bilaterally. Capillary refill within normal limits.  Negative for any significant edema or erythema  Neurological: Light touch and protective threshold grossly intact  Musculoskeletal Exam: No pedal deformities noted.  There is some mild to moderate tenderness throughout  the midfoot bilateral.  There is no significant pinpoint area of pain  Radiographic Exam B/L feet 10/04/2022:  Normal osseous mineralization. Joint spaces preserved. No fracture/dislocation/boney destruction.    Assessment: 1.  Morton's metatarsalgia bilateral feet   Plan of Care:  1. Patient evaluated. X-Rays reviewed.  2.  Today we discussed the pathology and etiology of Morton's metatarsalgia.  For now we are going to treat the patient very conservatively 3.  OTC power step insoles dispensed to support the medial longitudinal arch of the foot and offload pressure from the forefoot 4.  Recommend wide fitting shoes that do not constrict the toebox area 5.  Return to clinic as needed      Edrick Kins, DPM [facility]  Dr. Edrick Kins, DPM    [address], [place] [address]                Office [phone]  Fax [phone]

## 2022-10-05 ENCOUNTER — Encounter: Payer: Self-pay | Admitting: Family Medicine

## 2022-10-06 ENCOUNTER — Ambulatory Visit (HOSPITAL_BASED_OUTPATIENT_CLINIC_OR_DEPARTMENT_OTHER)
Admission: RE | Admit: 2022-10-06 | Discharge: 2022-10-06 | Disposition: A | Discharge status: Home / Self Care | Payer: BC Managed Care – PPO | Source: Ambulatory Visit | Attending: Family Medicine | Admitting: Family Medicine

## 2022-10-06 ENCOUNTER — Encounter: Payer: Self-pay | Admitting: Family Medicine

## 2022-10-06 DIAGNOSIS — M8580 Other specified disorders of bone density and structure, unspecified site: Secondary | ICD-10-CM | POA: Insufficient documentation

## 2022-10-06 DIAGNOSIS — M8589 Other specified disorders of bone density and structure, multiple sites: Secondary | ICD-10-CM | POA: Diagnosis not present

## 2022-10-06 DIAGNOSIS — E785 Hyperlipidemia, unspecified: Secondary | ICD-10-CM | POA: Diagnosis not present

## 2022-10-06 DIAGNOSIS — E2839 Other primary ovarian failure: Secondary | ICD-10-CM

## 2022-10-07 ENCOUNTER — Other Ambulatory Visit: Payer: Self-pay | Admitting: Podiatry

## 2022-10-07 ENCOUNTER — Other Ambulatory Visit: Payer: Self-pay | Admitting: Family Medicine

## 2022-10-07 DIAGNOSIS — M79671 Pain in right foot: Secondary | ICD-10-CM

## 2022-10-07 DIAGNOSIS — G5763 Lesion of plantar nerve, bilateral lower limbs: Secondary | ICD-10-CM

## 2022-10-07 DIAGNOSIS — E785 Hyperlipidemia, unspecified: Secondary | ICD-10-CM

## 2022-10-07 MED ORDER — ALENDRONATE SODIUM 70 MG PO TABS: 70.0000 mg | ORAL_TABLET | ORAL | 3 refills | Status: DC

## 2022-10-12 ENCOUNTER — Encounter: Payer: Self-pay | Admitting: Family

## 2022-10-12 ENCOUNTER — Ambulatory Visit (INDEPENDENT_AMBULATORY_CARE_PROVIDER_SITE_OTHER): Payer: BC Managed Care – PPO | Admitting: Family

## 2022-10-12 VITALS — BP 114/64 | HR 70 | Resp 18 | Ht 65.0 in | Wt 133.8 lb

## 2022-10-12 DIAGNOSIS — K2900 Acute gastritis without bleeding: Secondary | ICD-10-CM

## 2022-10-12 MED ORDER — PANTOPRAZOLE SODIUM 40 MG PO TBEC: 40.0000 mg | DELAYED_RELEASE_TABLET | Freq: Two times a day (BID) | ORAL | 0 refills | Status: DC

## 2022-10-12 NOTE — Progress Notes (Signed)
Judy Ramos is a 62 y.o. 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/05/2016   Hyperlipidemia 01/05/2016   Mild persistent asthma 11/13/2015   Perennial allergic rhinitis with a nonallergic component 11/10/2015   Persistent cough 11/10/2015   Esophageal reflux 11/10/2015   Chronic maxillary sinusitis 11/10/2015   History of asthma 11/10/2015   Rash 12/23/2010   IRREGULAR MENSES 01/20/2010   VARICOSE VEINS LOWER EXTREMITIES W/INFLAMMATION 10/16/2009   CERVICAL POLYP 10/16/2009   INSOMNIA 09/05/2008   Hypothyroid 10/23/2007   THYROID NODULE 02/08/2007    Current Outpatient Medications  Medication Sig Dispense Refill   albuterol (VENTOLIN HFA) 108 (90 Base) MCG/ACT inhaler Inhale 2 puffs into the lungs every 6 (six) hours as needed for wheezing or shortness of breath. 18 g 1   alendronate (FOSAMAX) 70 MG tablet Take 1 tablet (70 mg total) by mouth every 7 (seven) days. Take with a full glass of water on an empty stomach. 12 tablet 3   Ascorbic Acid (VITAMIN C) 500 MG CAPS      Collagenase POWD 1 Scoop by Does not apply route daily.     fluticasone (FLONASE) 50 MCG/ACT nasal spray Place 1 spray into both nostrils 2 (two) times daily.     IMVEXXY MAINTENANCE PACK 10 MCG INST SMARTSIG:1 insert Vaginal Twice a Week     levothyroxine (SYNTHROID) 50 MCG tablet TAKE 1 TABLET BY MOUTH DAILY BEFORE BREAKFAST 90 tablet 0   pantoprazole (PROTONIX) 40 MG tablet Take 1 tablet (40 mg total) by mouth 2 (two) times daily before a meal. 20 tablet 0   pravastatin (PRAVACHOL) 40 MG tablet TAKE 1 TABLET BY MOUTH EVERY DAY 90 tablet 3   Vitamin A 2400 MCG (8000 UT) CAPS      vitamin B-12 (CYANOCOBALAMIN) 500 MCG tablet      VITAMIN D,  ERGOCALCIFEROL, PO Take 1 tablet by mouth daily.     fluticasone (FLOVENT HFA) 110 MCG/ACT inhaler For asthma flares/upper respiratory infection start Flovent 110 mcg 2 puffs twice a day with spacer for 1 to 2 weeks or until symptoms return to baseline 1 each 2   Current Facility-Administered Medications  Medication Dose Route Frequency Provider Last Rate Last Admin   Mepolizumab SOLR 300 mg  300 mg Subcutaneous Q28 days Bobbitt, Sedalia Muta, MD   300 mg at 04/12/19 1437    Allergies: Amoxil [amoxicillin], Imuran [azathioprine], and Penicillins  Past Medical History:  Diagnosis Date   Allergic rhinitis    Asthma    Gallstone    Hypothyroidism    Thyroid nodule     Past Surgical History:  Procedure Laterality Date   AUGMENTATION MAMMAPLASTY Bilateral    BREAST ENHANCEMENT SURGERY  2001   CESAREAN SECTION     x 2   CHOLECYSTECTOMY  05/1999   DILATION AND CURETTAGE OF UTERUS  2011   LASIK     SINUS IRRIGATION Bilateral 12/03/2021   Got sinus cleaned out (polps and fungus balls)   uterine ablation  2011   VEIN LIGATION AND STRIPPING     VIDEO BRONCHOSCOPY Bilateral 12/10/2015   Procedure: VIDEO BRONCHOSCOPY WITHOUT FLUORO;  Surgeon: Tanda Rockers, MD;  Location: [facility];  Service: Endoscopy;  Laterality: Bilateral;  Family History  Problem Relation Age of Onset   Hypertension Mother    Arthritis Mother    Hypertension Father    Arthritis Father    Neuropathy Other    Lymphoma Paternal Uncle    Cancer Paternal Grandfather    Stroke Paternal Grandmother     Social History   Tobacco Use   Smoking status: Never   Smokeless tobacco: Never  Substance Use Topics   Alcohol use: Yes    Alcohol/week: 14.0 standard drinks of alcohol    Types: 14 Glasses of wine per week    Subjective:   1 week history of abdominal pain/ loose stools- seemed to start immediately after eating out at [employer]; did take Waldo with some initial relief; no diarrhea since the  end of last week; has been taking OTC Pepcid with some initial relief;  Has had gallbladder removed; no coffee grounds emesis/ no dark colored stools;  Had CPE with her PCP 2 weeks ago and all labs were very good;    Objective:  Vitals:   10/12/22 1406  BP: 114/64  Pulse: 70  Resp: 18  SpO2: 100%  Weight: 133 lb 12.8 oz (60.7 kg)  Height: 5' 5"$  (1.651 m)    General: Well developed, well nourished, in no acute distress  Skin : Warm and dry.  Head: Normocephalic and atraumatic  Eyes: Sclera and conjunctiva clear; pupils round and reactive to light; extraocular movements intact  Ears: External normal; canals clear; tympanic membranes normal  Oropharynx: Pink, supple. No suspicious lesions  Neck: Supple without thyromegaly, adenopathy  Lungs: Respirations unlabored; clear to auscultation bilaterally without wheeze, rales, rhonchi  CVS exam: normal rate and regular rhythm.  Abdomen: Soft; nontender; nondistended; normoactive bowel sounds; no masses or hepatosplenomegaly  Neurologic: Alert and oriented; speech intact; face symmetrical; moves all extremities well; CNII-XII intact without focal deficit   Assessment:  1. Acute gastritis without hemorrhage, unspecified gastritis type     Plan:  Physical exam is reassuring; short term trial of Protonix bid x 7-10 days; follow up worse, no better.   No follow-ups on file.  No orders of the defined types were placed in this encounter.   Requested Prescriptions   Signed Prescriptions Disp Refills   pantoprazole (PROTONIX) 40 MG tablet 20 tablet 0    Sig: Take 1 tablet (40 mg total) by mouth 2 (two) times daily before a meal.

## 2022-10-20 ENCOUNTER — Other Ambulatory Visit: Payer: Self-pay | Admitting: Family

## 2022-10-20 MED ORDER — PANTOPRAZOLE SODIUM 40 MG PO TBEC: 40.0000 mg | DELAYED_RELEASE_TABLET | Freq: Two times a day (BID) | ORAL | 0 refills | Status: AC

## 2022-11-12 DIAGNOSIS — J343 Hypertrophy of nasal turbinates: Secondary | ICD-10-CM | POA: Diagnosis not present

## 2022-11-12 DIAGNOSIS — J324 Chronic pansinusitis: Secondary | ICD-10-CM | POA: Diagnosis not present

## 2022-11-18 ENCOUNTER — Telehealth: Payer: Self-pay

## 2022-11-18 NOTE — Telephone Encounter (Signed)
Received faxed follow up SOAP note from ENT Dr. Benjamine Mola.  Placed note in Dr. Julianne Rice in basket.  Forwarding message to provider as update.

## 2022-11-22 NOTE — Telephone Encounter (Signed)
Reviewed notes from Dr. Benjamine Mola. Date of service: 11/12/2022. See scanned notes for full documentation. No infection/polyps.

## 2022-12-07 ENCOUNTER — Other Ambulatory Visit: Payer: Self-pay | Admitting: Family Medicine

## 2022-12-07 DIAGNOSIS — E039 Hypothyroidism, unspecified: Secondary | ICD-10-CM

## 2023-01-31 DIAGNOSIS — D229 Melanocytic nevi, unspecified: Secondary | ICD-10-CM | POA: Diagnosis not present

## 2023-01-31 DIAGNOSIS — L590 Erythema ab igne [dermatitis ab igne]: Secondary | ICD-10-CM | POA: Diagnosis not present

## 2023-01-31 DIAGNOSIS — Z129 Encounter for screening for malignant neoplasm, site unspecified: Secondary | ICD-10-CM | POA: Diagnosis not present

## 2023-01-31 DIAGNOSIS — D2372 Other benign neoplasm of skin of left lower limb, including hip: Secondary | ICD-10-CM | POA: Diagnosis not present

## 2023-05-18 DIAGNOSIS — J343 Hypertrophy of nasal turbinates: Secondary | ICD-10-CM | POA: Diagnosis not present

## 2023-05-18 DIAGNOSIS — J324 Chronic pansinusitis: Secondary | ICD-10-CM | POA: Diagnosis not present

## 2023-06-05 ENCOUNTER — Other Ambulatory Visit: Payer: Self-pay | Admitting: Family Medicine

## 2023-06-05 DIAGNOSIS — E039 Hypothyroidism, unspecified: Secondary | ICD-10-CM

## 2023-07-31 ENCOUNTER — Other Ambulatory Visit: Payer: Self-pay | Admitting: Family Medicine

## 2023-07-31 DIAGNOSIS — E785 Hyperlipidemia, unspecified: Secondary | ICD-10-CM

## 2023-08-16 DIAGNOSIS — Z1231 Encounter for screening mammogram for malignant neoplasm of breast: Secondary | ICD-10-CM | POA: Diagnosis not present

## 2023-08-16 DIAGNOSIS — Z124 Encounter for screening for malignant neoplasm of cervix: Secondary | ICD-10-CM | POA: Diagnosis not present

## 2023-08-16 DIAGNOSIS — Z6822 Body mass index (BMI) 22.0-22.9, adult: Secondary | ICD-10-CM | POA: Diagnosis not present

## 2023-08-16 DIAGNOSIS — Z01419 Encounter for gynecological examination (general) (routine) without abnormal findings: Secondary | ICD-10-CM | POA: Diagnosis not present

## 2023-09-19 DIAGNOSIS — L659 Nonscarring hair loss, unspecified: Secondary | ICD-10-CM | POA: Diagnosis not present

## 2023-10-05 DIAGNOSIS — L905 Scar conditions and fibrosis of skin: Secondary | ICD-10-CM | POA: Diagnosis not present

## 2023-10-07 DIAGNOSIS — Z79899 Other long term (current) drug therapy: Secondary | ICD-10-CM | POA: Diagnosis not present

## 2023-10-07 LAB — HEPATIC FUNCTION PANEL
ALT: 17 U/L (ref 7–35)
AST: 24 (ref 13–35)
Alkaline Phosphatase: 58 (ref 25–125)
Bilirubin, Total: 0.6

## 2023-10-07 LAB — BASIC METABOLIC PANEL
BUN: 17 (ref 4–21)
CO2: 22 (ref 13–22)
Chloride: 103 (ref 99–108)
Creatinine: 0.8 (ref 0.5–1.1)
Glucose: 91
Potassium: 4.7 meq/L (ref 3.5–5.1)
Sodium: 141 (ref 137–147)

## 2023-10-07 LAB — CBC AND DIFFERENTIAL
HCT: 40 (ref 36–46)
Hemoglobin: 13.3 (ref 12.0–16.0)
Neutrophils Absolute: 42
Platelets: 241 10*3/uL (ref 150–400)
WBC: 5.3

## 2023-10-07 LAB — LIPID PANEL
Cholesterol: 188 (ref 0–200)
HDL: 72 — AB (ref 35–70)
LDL Cholesterol: 3
Triglycerides: 51 (ref 40–160)

## 2023-10-07 LAB — COMPREHENSIVE METABOLIC PANEL WITH GFR
Albumin: 4.7 (ref 3.5–5.0)
EGFR: 81
Globulin: 2.3
eGFR: 1

## 2023-10-07 LAB — CBC: RBC: 3.95 (ref 3.87–5.11)

## 2023-10-10 ENCOUNTER — Other Ambulatory Visit: Payer: Self-pay | Admitting: Family Medicine

## 2023-10-10 ENCOUNTER — Encounter: Payer: Self-pay | Admitting: Family Medicine

## 2023-10-10 DIAGNOSIS — E039 Hypothyroidism, unspecified: Secondary | ICD-10-CM

## 2023-10-10 DIAGNOSIS — M8580 Other specified disorders of bone density and structure, unspecified site: Secondary | ICD-10-CM

## 2023-10-10 MED ORDER — LEVOTHYROXINE SODIUM 50 MCG PO TABS: 50.0000 ug | ORAL_TABLET | Freq: Every day | ORAL | 0 refills | Status: DC

## 2023-10-10 NOTE — Telephone Encounter (Signed)
 Pharmacy comment: Product Backordered/Unavailable:WE NEED PERMISSION FROM MD SINCE ITS AN NTI DRUG, TO CHANGE MANUFACTURER. PLEASE ADVISE.

## 2023-10-10 NOTE — Telephone Encounter (Signed)
 Pharmacy comment: Script Clarification:WE CAN NO LONGER GET MYLAN MANUF IN, DO WE HAVE PERMISSION TO CHANGE MANUF?

## 2023-10-12 ENCOUNTER — Other Ambulatory Visit: Payer: Self-pay | Admitting: Family Medicine

## 2023-10-12 DIAGNOSIS — E039 Hypothyroidism, unspecified: Secondary | ICD-10-CM

## 2023-10-12 NOTE — Telephone Encounter (Signed)
Pharmacy comment: Alternative Requested:NTI DRUG, NOW NEED PERMISSION FROM MD TO CHANGE MANUF, PLEASE ADVISE.

## 2023-10-27 DIAGNOSIS — D225 Melanocytic nevi of trunk: Secondary | ICD-10-CM | POA: Diagnosis not present

## 2023-10-27 NOTE — Progress Notes (Deleted)
 [facility] [address], [address] [place], [place] [address] [phone]  Date:  11/03/2023   Name:  Judy Ramos   DOB:  11-Aug-1961   MRN:  [id number]  PCP:  Pearline Cables, MD    Chief Complaint: No chief complaint on file.   History of Present Illness:  Judy Ramos is a 63 y.o. 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  This is managed by [facility] rheumatology, she also sees asthma/allergy with Dr. Nunzio Cobbs now Dr Selena Batten    Married to Judy Ramos, 2 adult sons living in [place].  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   Hyperlipidemia 01/05/2016   Mild persistent asthma 11/13/2015   Perennial allergic rhinitis with a nonallergic component 11/10/2015   Persistent cough 11/10/2015   Esophageal reflux 11/10/2015   Chronic maxillary sinusitis 11/10/2015   History of asthma 11/10/2015   Rash 12/23/2010   IRREGULAR MENSES 01/20/2010   VARICOSE VEINS LOWER EXTREMITIES W/INFLAMMATION 10/16/2009   CERVICAL POLYP 10/16/2009   INSOMNIA 09/05/2008   Hypothyroid 10/23/2007   THYROID NODULE 02/08/2007    Past Medical History:  Diagnosis Date   Allergic rhinitis    Asthma    Gallstone    Hypothyroidism    Thyroid nodule     Past Surgical History:  Procedure Laterality Date   AUGMENTATION MAMMAPLASTY  Bilateral    BREAST ENHANCEMENT SURGERY  2001   CESAREAN SECTION     x 2   CHOLECYSTECTOMY  05/1999   DILATION AND CURETTAGE OF UTERUS  2011   LASIK     SINUS IRRIGATION Bilateral 12/03/2021   Got sinus cleaned out (polps and fungus balls)   uterine ablation  2011   VEIN LIGATION AND STRIPPING     VIDEO BRONCHOSCOPY Bilateral 12/10/2015   Procedure: VIDEO BRONCHOSCOPY WITHOUT FLUORO;  Surgeon: Nyoka Cowden, MD;  Location: [facility];  Service: Endoscopy;  Laterality: Bilateral;    Social History   Tobacco Use   Smoking status: Never   Smokeless tobacco: Never  Vaping Use   Vaping status: Never Used  Substance Use Topics   Alcohol use: Yes    Alcohol/week: 14.0 standard drinks of alcohol    Types: 14 Glasses of wine per week   Drug use: No    Family History  Problem Relation Age of Onset   Hypertension Mother    Arthritis Mother    Hypertension Father    Arthritis Father    Neuropathy Other    Lymphoma Paternal Uncle    Cancer Paternal Grandfather    Stroke Paternal Grandmother     Allergies  Allergen Reactions   Amoxil [Amoxicillin]    Imuran [Azathioprine] Rash    fever   Penicillins Rash    Medication list has been reviewed and updated.  Current Outpatient Medications on File Prior to Visit  Medication Sig  Dispense Refill   albuterol (VENTOLIN HFA) 108 (90 Base) MCG/ACT inhaler Inhale 2 puffs into the lungs every 6 (six) hours as needed for wheezing or shortness of breath. 18 g 1   alendronate (FOSAMAX) 70 MG tablet TAKE 1 TABLET (70 MG TOTAL) BY MOUTH EVERY 7 DAYS WITH FULL GLASS WATER ON EMPTY STOMACH 12 tablet 3   Ascorbic Acid (VITAMIN C) 500 MG CAPS      Collagenase POWD 1 Scoop by Does not apply route daily.     fluticasone (FLONASE) 50 MCG/ACT nasal spray Place 1 spray into both nostrils 2 (two) times daily.     IMVEXXY MAINTENANCE PACK 10 MCG INST SMARTSIG:1 insert Vaginal Twice a Week     levothyroxine (LEVOXYL) 50 MCG tablet Take 1 tablet (50  mcg total) by mouth daily before breakfast. 90 tablet 2   pantoprazole (PROTONIX) 40 MG tablet Take 1 tablet (40 mg total) by mouth 2 (two) times daily before a meal. 20 tablet 0   pravastatin (PRAVACHOL) 40 MG tablet Take 1 tablet (40 mg total) by mouth daily. 90 tablet 0   Vitamin A 2400 MCG (8000 UT) CAPS      vitamin B-12 (CYANOCOBALAMIN) 500 MCG tablet      VITAMIN D, ERGOCALCIFEROL, PO Take 1 tablet by mouth daily.     Current Facility-Administered Medications on File Prior to Visit  Medication Dose Route Frequency Provider Last Rate Last Admin   Mepolizumab SOLR 300 mg  300 mg Subcutaneous Q28 days Bobbitt, Heywood Iles, MD   300 mg at 04/12/19 1437    Review of Systems:  As per HPI- otherwise negative.   Physical Examination: There were no vitals filed for this visit. There were no vitals filed for this visit. There is no height or weight on file to calculate BMI. Ideal Body Weight:    GEN: no acute distress. HEENT: Atraumatic, Normocephalic.  Ears and Nose: No external deformity. CV: RRR, No M/G/R. No JVD. No thrill. No extra heart sounds. PULM: CTA B, no wheezes, crackles, rhonchi. No retractions. No resp. distress. No accessory muscle use. ABD: S, NT, ND, +BS. No rebound. No HSM. EXTR: No c/c/e PSYCH: Normally interactive. Conversant.    Assessment and Plan: *** Physical exam today- encouraged healthy diet and exercise routine Will plan further follow- up pending labs.   Signed Abbe Amsterdam, MD

## 2023-11-03 ENCOUNTER — Encounter: Payer: BC Managed Care – PPO | Admitting: Family Medicine

## 2023-11-03 DIAGNOSIS — Z0000 Encounter for general adult medical examination without abnormal findings: Secondary | ICD-10-CM

## 2023-11-03 DIAGNOSIS — M8580 Other specified disorders of bone density and structure, unspecified site: Secondary | ICD-10-CM

## 2023-11-03 DIAGNOSIS — Z131 Encounter for screening for diabetes mellitus: Secondary | ICD-10-CM

## 2023-11-03 DIAGNOSIS — E039 Hypothyroidism, unspecified: Secondary | ICD-10-CM

## 2023-11-03 DIAGNOSIS — E785 Hyperlipidemia, unspecified: Secondary | ICD-10-CM

## 2023-11-03 DIAGNOSIS — Z130 Encounter for screening for diseases of the blood and blood-forming organs and certain disorders involving the immune mechanism: Secondary | ICD-10-CM

## 2023-11-15 ENCOUNTER — Telehealth (INDEPENDENT_AMBULATORY_CARE_PROVIDER_SITE_OTHER): Payer: Self-pay | Admitting: Otolaryngology

## 2023-11-15 NOTE — Telephone Encounter (Signed)
 LVM to confirm appt & location [phone] afm

## 2023-11-16 ENCOUNTER — Ambulatory Visit (INDEPENDENT_AMBULATORY_CARE_PROVIDER_SITE_OTHER): Payer: BC Managed Care – PPO | Admitting: Otolaryngology

## 2023-11-16 ENCOUNTER — Encounter (INDEPENDENT_AMBULATORY_CARE_PROVIDER_SITE_OTHER): Payer: Self-pay

## 2023-11-16 VITALS — BP 111/70 | HR 68 | Ht 65.0 in | Wt 136.0 lb

## 2023-11-16 DIAGNOSIS — J338 Other polyp of sinus: Secondary | ICD-10-CM

## 2023-11-16 DIAGNOSIS — R0981 Nasal congestion: Secondary | ICD-10-CM | POA: Diagnosis not present

## 2023-11-16 DIAGNOSIS — J343 Hypertrophy of nasal turbinates: Secondary | ICD-10-CM | POA: Diagnosis not present

## 2023-11-16 DIAGNOSIS — J310 Chronic rhinitis: Secondary | ICD-10-CM

## 2023-11-18 DIAGNOSIS — J343 Hypertrophy of nasal turbinates: Secondary | ICD-10-CM | POA: Insufficient documentation

## 2023-11-18 DIAGNOSIS — J338 Other polyp of sinus: Secondary | ICD-10-CM | POA: Insufficient documentation

## 2023-11-18 NOTE — Progress Notes (Signed)
 Patient ID: Judy Ramos, female   DOB: 01-Jul-1961, 63 y.o.   MRN: [id number]  Follow-up: Recurrent rhinosinusitis and polyposis  HPI: The patient is a 63 year old female who returns today for her follow-up evaluation. The patient has a history of bilateral chronic pansinusitis and polyposis.  She underwent bilateral endoscopic sinus surgery in April 2023.  At her last visit 6 months ago, no acute infection was noted.  She was continued on her Flonase nasal spray.  The patient returns today reporting no significant difficulty over the past 6 months.  She is able to breathe through both nostrils.  She reports occasional postnasal drainage.  She denies any facial pain, fever, or visual change.    Exam: General: Communicates without difficulty, well nourished, no acute distress. Head: Normocephalic, no evidence injury, no tenderness, facial buttresses intact without stepoff. Face/sinus: No tenderness to palpation and percussion. Facial movement is normal and symmetric. Eyes: PERRL, EOMI. No scleral icterus, conjunctivae clear. Neuro: CN II exam reveals vision grossly intact.  No nystagmus at any point of gaze. Ears: Auricles well formed without lesions.  Ear canals are intact without mass or lesion.  No erythema or edema is appreciated.  The TMs are intact without fluid. Nose: External evaluation reveals normal support and skin without lesions.  Dorsum is intact.  Anterior rhinoscopy reveals congested mucosa over anterior aspect of inferior turbinates and intact septum.  No purulence noted. Oral:  Oral cavity and oropharynx are intact, symmetric, without erythema or edema.  Mucosa is moist without lesions. Neck: Full range of motion without pain.  There is no significant lymphadenopathy.  No masses palpable.  Thyroid bed within normal limits to palpation.  Parotid glands and submandibular glands equal bilaterally without mass.  Trachea is midline. Neuro:  CN 2-12 grossly intact.   Assessment: 1.  Chronic  rhinitis with nasal mucosal congestion and bilateral inferior turbinate hypertrophy.  Her sinus openings are widely patent.  No recurrent infection is noted today. 3.  History of environmental allergies.  Plan: 1.  The physical exam findings are reviewed with the patient. 2.  The patient is reassured that no infection or recurrent polyposis is noted today. 3.  Continue with Flonase nasal spray and nasal saline irrigation. 4.  The patient will return for reevaluation in 6 months.

## 2023-11-21 NOTE — Progress Notes (Unsigned)
 [facility] at [facility] [address], [address] [place], [place] [address] [phone]  Date:  11/23/2023   Name:  Judy Ramos   DOB:  01-26-1961   MRN:  [id number]  PCP:  Pearline Cables, MD    Chief Complaint: No chief complaint on file.   History of Present Illness:  Judy Ramos is a 63 y.o. 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  This is managed by [facility] rheumatology, she also sees asthma/allergy with Dr. Nunzio Cobbs now Dr Selena Batten    Married to Judy Ramos, 2 adult sons living in [place].  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   Hyperlipidemia 01/05/2016   Mild persistent asthma 11/13/2015   Perennial allergic rhinitis with a nonallergic component 11/10/2015   Persistent cough 11/10/2015   Esophageal reflux 11/10/2015   Chronic maxillary sinusitis 11/10/2015   History of asthma 11/10/2015   Rash 12/23/2010   IRREGULAR MENSES 01/20/2010   VARICOSE VEINS LOWER EXTREMITIES W/INFLAMMATION 10/16/2009   CERVICAL POLYP 10/16/2009   INSOMNIA 09/05/2008   Hypothyroid 10/23/2007   THYROID NODULE 02/08/2007    Past Medical History:  Diagnosis Date   Allergic rhinitis    Asthma     Gallstone    Hypothyroidism    Thyroid nodule     Past Surgical History:  Procedure Laterality Date   AUGMENTATION MAMMAPLASTY Bilateral    BREAST ENHANCEMENT SURGERY  2001   CESAREAN SECTION     x 2   CHOLECYSTECTOMY  05/1999   DILATION AND CURETTAGE OF UTERUS  2011   LASIK     SINUS IRRIGATION Bilateral 12/03/2021   Got sinus cleaned out (polps and fungus balls)   uterine ablation  2011   VEIN LIGATION AND STRIPPING     VIDEO BRONCHOSCOPY Bilateral 12/10/2015   Procedure: VIDEO BRONCHOSCOPY WITHOUT FLUORO;  Surgeon: Nyoka Cowden, MD;  Location: [facility];  Service: Endoscopy;  Laterality: Bilateral;    Social History   Tobacco Use   Smoking status: Never   Smokeless tobacco: Never  Vaping Use   Vaping status: Never Used  Substance Use Topics   Alcohol use: Yes    Alcohol/week: 14.0 standard drinks of alcohol    Types: 14 Glasses of wine per week   Drug use: No    Family History  Problem Relation Age of Onset   Hypertension Mother    Arthritis Mother    Hypertension Father    Arthritis Father    Neuropathy Other    Lymphoma Paternal Uncle    Cancer Paternal Grandfather    Stroke Paternal Grandmother     Allergies  Allergen Reactions   Amoxil [Amoxicillin]    Imuran [Azathioprine] Rash    fever  Penicillins Rash    Medication list has been reviewed and updated.  Current Outpatient Medications on File Prior to Visit  Medication Sig Dispense Refill   albuterol (VENTOLIN HFA) 108 (90 Base) MCG/ACT inhaler Inhale 2 puffs into the lungs every 6 (six) hours as needed for wheezing or shortness of breath. 18 g 1   alendronate (FOSAMAX) 70 MG tablet TAKE 1 TABLET (70 MG TOTAL) BY MOUTH EVERY 7 DAYS WITH FULL GLASS WATER ON EMPTY STOMACH 12 tablet 3   Ascorbic Acid (VITAMIN C) 500 MG CAPS      Collagenase POWD 1 Scoop by Does not apply route daily.     fluticasone (FLONASE) 50 MCG/ACT nasal spray Place 1 spray into both nostrils 2 (two) times daily.      IMVEXXY MAINTENANCE PACK 10 MCG INST SMARTSIG:1 insert Vaginal Twice a Week     levothyroxine (LEVOXYL) 50 MCG tablet Take 1 tablet (50 mcg total) by mouth daily before breakfast. 90 tablet 2   pantoprazole (PROTONIX) 40 MG tablet Take 1 tablet (40 mg total) by mouth 2 (two) times daily before a meal. 20 tablet 0   pravastatin (PRAVACHOL) 40 MG tablet Take 1 tablet (40 mg total) by mouth daily. 90 tablet 0   Ritlecitinib Tosylate (LITFULO) 50 MG CAPS Take 50 mg by mouth daily.     Vitamin A 2400 MCG (8000 UT) CAPS      vitamin B-12 (CYANOCOBALAMIN) 500 MCG tablet      VITAMIN D, ERGOCALCIFEROL, PO Take 1 tablet by mouth daily.     Current Facility-Administered Medications on File Prior to Visit  Medication Dose Route Frequency Provider Last Rate Last Admin   Mepolizumab SOLR 300 mg  300 mg Subcutaneous Q28 days Bobbitt, Heywood Iles, MD   300 mg at 04/12/19 1437    Review of Systems:  As per HPI- otherwise negative.   Physical Examination: There were no vitals filed for this visit. There were no vitals filed for this visit. There is no height or weight on file to calculate BMI. Ideal Body Weight:    GEN: no acute distress. HEENT: Atraumatic, Normocephalic.  Ears and Nose: No external deformity. CV: RRR, No M/G/R. No JVD. No thrill. No extra heart sounds. PULM: CTA B, no wheezes, crackles, rhonchi. No retractions. No resp. distress. No accessory muscle use. ABD: S, NT, ND, +BS. No rebound. No HSM. EXTR: No c/c/e PSYCH: Normally interactive. Conversant.    Assessment and Plan: *** Physical exam today- encouraged healthy diet and exercise routine Will plan further follow- up pending labs.   Signed Abbe Amsterdam, MD

## 2023-11-21 NOTE — Patient Instructions (Incomplete)
 Good to see you again today- I will be in touch with your labs asap  Please call [facility] to check on when you need your next colonoscopy [facility] [id number]  [phone]  Recommend shingles vaccine, annual flu and covid booster if none in the last 6 months or so

## 2023-11-23 ENCOUNTER — Ambulatory Visit (INDEPENDENT_AMBULATORY_CARE_PROVIDER_SITE_OTHER): Admitting: Family Medicine

## 2023-11-23 VITALS — BP 110/62 | HR 62 | Temp 97.7°F | Resp 18 | Ht 65.0 in | Wt 139.0 lb

## 2023-11-23 DIAGNOSIS — Z0000 Encounter for general adult medical examination without abnormal findings: Secondary | ICD-10-CM

## 2023-11-23 DIAGNOSIS — E785 Hyperlipidemia, unspecified: Secondary | ICD-10-CM | POA: Diagnosis not present

## 2023-11-23 DIAGNOSIS — Z131 Encounter for screening for diabetes mellitus: Secondary | ICD-10-CM

## 2023-11-23 DIAGNOSIS — D7589 Other specified diseases of blood and blood-forming organs: Secondary | ICD-10-CM | POA: Diagnosis not present

## 2023-11-23 DIAGNOSIS — E039 Hypothyroidism, unspecified: Secondary | ICD-10-CM

## 2023-11-23 DIAGNOSIS — M8580 Other specified disorders of bone density and structure, unspecified site: Secondary | ICD-10-CM | POA: Diagnosis not present

## 2023-11-23 DIAGNOSIS — Z130 Encounter for screening for diseases of the blood and blood-forming organs and certain disorders involving the immune mechanism: Secondary | ICD-10-CM

## 2023-11-23 LAB — HEMOGLOBIN A1C: Hgb A1c MFr Bld: 5.5 % (ref 4.6–6.5)

## 2023-11-23 LAB — LAB REPORT - SCANNED: QuantiFERON-TB Gold Plus: NEGATIVE

## 2023-11-23 MED ORDER — PRAVASTATIN SODIUM 40 MG PO TABS: 40.0000 mg | ORAL_TABLET | Freq: Every day | ORAL | 3 refills | Status: AC

## 2023-11-24 LAB — FOLATE: Folate: 13.6 ng/mL (ref >5.9)

## 2023-11-24 LAB — VITAMIN B12: Vitamin B-12: >1537 pg/mL — ABNORMAL HIGH (ref 211–911)

## 2023-11-24 LAB — TSH: TSH: 0.98 u[IU]/mL (ref 0.35–5.50)

## 2023-11-25 ENCOUNTER — Encounter: Payer: Self-pay | Admitting: Family Medicine

## 2024-01-10 ENCOUNTER — Other Ambulatory Visit: Payer: Self-pay | Admitting: Family Medicine

## 2024-01-10 DIAGNOSIS — E039 Hypothyroidism, unspecified: Secondary | ICD-10-CM

## 2024-01-11 DIAGNOSIS — L638 Other alopecia areata: Secondary | ICD-10-CM | POA: Diagnosis not present

## 2024-01-16 DIAGNOSIS — L659 Nonscarring hair loss, unspecified: Secondary | ICD-10-CM | POA: Diagnosis not present

## 2024-01-16 DIAGNOSIS — Z79899 Other long term (current) drug therapy: Secondary | ICD-10-CM | POA: Diagnosis not present

## 2024-02-09 ENCOUNTER — Encounter: Payer: Self-pay | Admitting: Allergy

## 2024-02-09 NOTE — Progress Notes (Signed)
 Reviewed notes from Dr. Carlin Hollar ((Derm). Date of service: 01/11/2024. See scanned notes for full documentation. 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  Letter faxed to Dr. Raynaldo Call.

## 2024-05-08 DIAGNOSIS — D2372 Other benign neoplasm of skin of left lower limb, including hip: Secondary | ICD-10-CM | POA: Diagnosis not present

## 2024-05-08 DIAGNOSIS — Z129 Encounter for screening for malignant neoplasm, site unspecified: Secondary | ICD-10-CM | POA: Diagnosis not present

## 2024-05-08 DIAGNOSIS — L638 Other alopecia areata: Secondary | ICD-10-CM | POA: Diagnosis not present

## 2024-05-08 DIAGNOSIS — D3610 Benign neoplasm of peripheral nerves and autonomic nervous system, unspecified: Secondary | ICD-10-CM | POA: Diagnosis not present

## 2024-05-24 ENCOUNTER — Encounter (INDEPENDENT_AMBULATORY_CARE_PROVIDER_SITE_OTHER): Payer: Self-pay | Admitting: Otolaryngology

## 2024-05-24 ENCOUNTER — Ambulatory Visit (INDEPENDENT_AMBULATORY_CARE_PROVIDER_SITE_OTHER): Admitting: Otolaryngology

## 2024-05-24 VITALS — BP 108/65 | HR 72 | Temp 97.5°F

## 2024-05-24 DIAGNOSIS — R0981 Nasal congestion: Secondary | ICD-10-CM

## 2024-05-24 DIAGNOSIS — J338 Other polyp of sinus: Secondary | ICD-10-CM

## 2024-05-24 DIAGNOSIS — J343 Hypertrophy of nasal turbinates: Secondary | ICD-10-CM | POA: Diagnosis not present

## 2024-05-24 DIAGNOSIS — J310 Chronic rhinitis: Secondary | ICD-10-CM

## 2024-05-24 NOTE — Progress Notes (Unsigned)
 Patient ID: Judy Ramos, female   DOB: 01-Jul-1961, 63 y.o.   MRN: [id number]  Follow-up: Recurrent rhinosinusitis and polyposis  HPI: The patient is a 63 year old female who returns today for her follow-up evaluation. The patient has a history of bilateral chronic pansinusitis and polyposis.  She underwent bilateral endoscopic sinus surgery in April 2023.  At her last visit 6 months ago, no acute infection was noted.  She was continued on her Flonase nasal spray.  The patient returns today reporting no significant difficulty over the past 6 months.  She is able to breathe through both nostrils.  She reports occasional postnasal drainage.  She denies any facial pain, fever, or visual change.    Exam: General: Communicates without difficulty, well nourished, no acute distress. Head: Normocephalic, no evidence injury, no tenderness, facial buttresses intact without stepoff. Face/sinus: No tenderness to palpation and percussion. Facial movement is normal and symmetric. Eyes: PERRL, EOMI. No scleral icterus, conjunctivae clear. Neuro: CN II exam reveals vision grossly intact.  No nystagmus at any point of gaze. Ears: Auricles well formed without lesions.  Ear canals are intact without mass or lesion.  No erythema or edema is appreciated.  The TMs are intact without fluid. Nose: External evaluation reveals normal support and skin without lesions.  Dorsum is intact.  Anterior rhinoscopy reveals congested mucosa over anterior aspect of inferior turbinates and intact septum.  No purulence noted. Oral:  Oral cavity and oropharynx are intact, symmetric, without erythema or edema.  Mucosa is moist without lesions. Neck: Full range of motion without pain.  There is no significant lymphadenopathy.  No masses palpable.  Thyroid bed within normal limits to palpation.  Parotid glands and submandibular glands equal bilaterally without mass.  Trachea is midline. Neuro:  CN 2-12 grossly intact.   Assessment: 1.  Chronic  rhinitis with nasal mucosal congestion and bilateral inferior turbinate hypertrophy.  Her sinus openings are widely patent.  No recurrent infection is noted today. 3.  History of environmental allergies.  Plan: 1.  The physical exam findings are reviewed with the patient. 2.  The patient is reassured that no infection or recurrent polyposis is noted today. 3.  Continue with Flonase nasal spray and nasal saline irrigation. 4.  The patient will return for reevaluation in 6 months.

## 2024-07-06 ENCOUNTER — Other Ambulatory Visit: Payer: Self-pay | Admitting: Family Medicine

## 2024-07-06 DIAGNOSIS — E039 Hypothyroidism, unspecified: Secondary | ICD-10-CM

## 2024-07-20 DIAGNOSIS — Z79899 Other long term (current) drug therapy: Secondary | ICD-10-CM | POA: Diagnosis not present

## 2024-08-19 DIAGNOSIS — T7840XA Allergy, unspecified, initial encounter: Secondary | ICD-10-CM | POA: Diagnosis not present

## 2024-08-19 DIAGNOSIS — L509 Urticaria, unspecified: Secondary | ICD-10-CM | POA: Diagnosis not present

## 2024-10-02 ENCOUNTER — Other Ambulatory Visit: Payer: Self-pay | Admitting: Family Medicine

## 2024-10-02 DIAGNOSIS — E039 Hypothyroidism, unspecified: Secondary | ICD-10-CM
